# Patient Record
Sex: Male | Born: 1935 | Race: Asian | Hispanic: No | Marital: Married | Smoking: Former smoker
Health system: Southern US, Community
[De-identification: ages and names within clinical notes are randomized; demographics above are authoritative.]

## PROBLEM LIST (undated history)

## (undated) DIAGNOSIS — Z860100 Personal history of colon polyps, unspecified: Secondary | ICD-10-CM

## (undated) DIAGNOSIS — R51 Headache: Secondary | ICD-10-CM

## (undated) DIAGNOSIS — I350 Nonrheumatic aortic (valve) stenosis: Secondary | ICD-10-CM

## (undated) DIAGNOSIS — M1990 Unspecified osteoarthritis, unspecified site: Secondary | ICD-10-CM

## (undated) DIAGNOSIS — R519 Headache, unspecified: Secondary | ICD-10-CM

## (undated) DIAGNOSIS — K648 Other hemorrhoids: Secondary | ICD-10-CM

## (undated) DIAGNOSIS — I2510 Atherosclerotic heart disease of native coronary artery without angina pectoris: Secondary | ICD-10-CM

## (undated) DIAGNOSIS — J189 Pneumonia, unspecified organism: Secondary | ICD-10-CM

## (undated) DIAGNOSIS — N400 Enlarged prostate without lower urinary tract symptoms: Secondary | ICD-10-CM

## (undated) DIAGNOSIS — C4491 Basal cell carcinoma of skin, unspecified: Secondary | ICD-10-CM

## (undated) DIAGNOSIS — I714 Abdominal aortic aneurysm, without rupture, unspecified: Secondary | ICD-10-CM

## (undated) DIAGNOSIS — Z950 Presence of cardiac pacemaker: Secondary | ICD-10-CM

## (undated) DIAGNOSIS — Z8611 Personal history of tuberculosis: Secondary | ICD-10-CM

## (undated) DIAGNOSIS — R011 Cardiac murmur, unspecified: Secondary | ICD-10-CM

## (undated) DIAGNOSIS — K279 Peptic ulcer, site unspecified, unspecified as acute or chronic, without hemorrhage or perforation: Secondary | ICD-10-CM

## (undated) DIAGNOSIS — T280XXA Burn of mouth and pharynx, initial encounter: Secondary | ICD-10-CM

## (undated) DIAGNOSIS — I4892 Unspecified atrial flutter: Secondary | ICD-10-CM

## (undated) DIAGNOSIS — N419 Inflammatory disease of prostate, unspecified: Secondary | ICD-10-CM

## (undated) DIAGNOSIS — L820 Inflamed seborrheic keratosis: Secondary | ICD-10-CM

## (undated) DIAGNOSIS — R55 Syncope and collapse: Secondary | ICD-10-CM

## (undated) DIAGNOSIS — R410 Disorientation, unspecified: Secondary | ICD-10-CM

## (undated) DIAGNOSIS — I4891 Unspecified atrial fibrillation: Secondary | ICD-10-CM

## (undated) DIAGNOSIS — IMO0001 Reserved for inherently not codable concepts without codable children: Secondary | ICD-10-CM

## (undated) DIAGNOSIS — J309 Allergic rhinitis, unspecified: Secondary | ICD-10-CM

## (undated) DIAGNOSIS — I10 Essential (primary) hypertension: Secondary | ICD-10-CM

## (undated) DIAGNOSIS — H8110 Benign paroxysmal vertigo, unspecified ear: Secondary | ICD-10-CM

## (undated) DIAGNOSIS — K219 Gastro-esophageal reflux disease without esophagitis: Secondary | ICD-10-CM

## (undated) DIAGNOSIS — H9113 Presbycusis, bilateral: Secondary | ICD-10-CM

## (undated) DIAGNOSIS — I219 Acute myocardial infarction, unspecified: Secondary | ICD-10-CM

## (undated) DIAGNOSIS — K5730 Diverticulosis of large intestine without perforation or abscess without bleeding: Secondary | ICD-10-CM

## (undated) DIAGNOSIS — E782 Mixed hyperlipidemia: Secondary | ICD-10-CM

## (undated) DIAGNOSIS — I495 Sick sinus syndrome: Secondary | ICD-10-CM

## (undated) HISTORY — DX: Disorientation, unspecified: R41.0

## (undated) HISTORY — DX: Inflamed seborrheic keratosis: L82.0

## (undated) HISTORY — DX: Benign prostatic hyperplasia without lower urinary tract symptoms: N40.0

## (undated) HISTORY — DX: Cardiac murmur, unspecified: R01.1

## (undated) HISTORY — DX: Gastro-esophageal reflux disease without esophagitis: K21.9

## (undated) HISTORY — DX: Atherosclerotic heart disease of native coronary artery without angina pectoris: I25.10

## (undated) HISTORY — DX: Unspecified atrial flutter: I48.92

## (undated) HISTORY — DX: Other hemorrhoids: K64.8

## (undated) HISTORY — DX: Inflammatory disease of prostate, unspecified: N41.9

## (undated) HISTORY — DX: Unspecified osteoarthritis, unspecified site: M19.90

## (undated) HISTORY — DX: Unspecified atrial fibrillation: I48.91

## (undated) HISTORY — PX: OTHER SURGICAL HISTORY: SHX169

## (undated) HISTORY — DX: Peptic ulcer, site unspecified, unspecified as acute or chronic, without hemorrhage or perforation: K27.9

## (undated) HISTORY — PX: COLONOSCOPY: SHX174

## (undated) HISTORY — PX: EYE SURGERY: SHX253

## (undated) HISTORY — DX: Presbycusis, bilateral: H91.13

## (undated) HISTORY — DX: Allergic rhinitis, unspecified: J30.9

## (undated) HISTORY — DX: Abdominal aortic aneurysm, without rupture, unspecified: I71.40

## (undated) HISTORY — DX: Mixed hyperlipidemia: E78.2

## (undated) HISTORY — DX: Personal history of colonic polyps: Z86.010

## (undated) HISTORY — DX: Reserved for inherently not codable concepts without codable children: IMO0001

## (undated) HISTORY — PX: EXCISION OF TONGUE LESION: SHX6434

## (undated) HISTORY — DX: Syncope and collapse: R55

## (undated) HISTORY — DX: Essential (primary) hypertension: I10

## (undated) HISTORY — DX: Abdominal aortic aneurysm, without rupture: I71.4

## (undated) HISTORY — DX: Benign paroxysmal vertigo, unspecified ear: H81.10

## (undated) HISTORY — DX: Personal history of colon polyps, unspecified: Z86.0100

## (undated) HISTORY — DX: Personal history of tuberculosis: Z86.11

## (undated) HISTORY — DX: Diverticulosis of large intestine without perforation or abscess without bleeding: K57.30

## (undated) HISTORY — DX: Presence of cardiac pacemaker: Z95.0

## (undated) HISTORY — DX: Basal cell carcinoma of skin, unspecified: C44.91

## (undated) HISTORY — DX: Burn of mouth and pharynx, initial encounter: T28.0XXA

## (undated) HISTORY — DX: Sick sinus syndrome: I49.5

---

## 1995-09-14 HISTORY — PX: CORONARY ARTERY BYPASS GRAFT: SHX141

## 1998-09-13 HISTORY — PX: OTHER SURGICAL HISTORY: SHX169

## 1999-06-16 ENCOUNTER — Encounter: Admission: RE | Admit: 1999-06-16 | Discharge: 1999-07-21 | Payer: Self-pay | Admitting: Sports Medicine

## 2000-04-18 ENCOUNTER — Encounter: Admission: RE | Admit: 2000-04-18 | Discharge: 2000-07-17 | Payer: Self-pay | Admitting: Orthopedic Surgery

## 2000-09-13 HISTORY — PX: CARDIAC CATHETERIZATION: SHX172

## 2000-12-02 ENCOUNTER — Encounter: Payer: Self-pay | Admitting: Emergency Medicine

## 2000-12-02 ENCOUNTER — Emergency Department (HOSPITAL_COMMUNITY): Admission: EM | Admit: 2000-12-02 | Discharge: 2000-12-02 | Payer: Self-pay | Admitting: Emergency Medicine

## 2000-12-28 ENCOUNTER — Ambulatory Visit (HOSPITAL_COMMUNITY): Admission: RE | Admit: 2000-12-28 | Discharge: 2000-12-29 | Payer: Self-pay | Admitting: Interventional Cardiology

## 2002-05-15 ENCOUNTER — Encounter (INDEPENDENT_AMBULATORY_CARE_PROVIDER_SITE_OTHER): Payer: Self-pay | Admitting: Specialist

## 2002-05-15 ENCOUNTER — Ambulatory Visit (HOSPITAL_COMMUNITY): Admission: RE | Admit: 2002-05-15 | Discharge: 2002-05-15 | Payer: Self-pay | Admitting: Gastroenterology

## 2006-07-14 ENCOUNTER — Encounter: Admission: RE | Admit: 2006-07-14 | Discharge: 2006-07-14 | Payer: Self-pay | Admitting: Orthopedic Surgery

## 2006-08-03 ENCOUNTER — Ambulatory Visit (HOSPITAL_COMMUNITY): Admission: RE | Admit: 2006-08-03 | Discharge: 2006-08-04 | Payer: Self-pay | Admitting: Orthopedic Surgery

## 2006-08-10 ENCOUNTER — Encounter: Admission: RE | Admit: 2006-08-10 | Discharge: 2006-11-08 | Payer: Self-pay | Admitting: Orthopedic Surgery

## 2006-11-09 ENCOUNTER — Encounter: Admission: RE | Admit: 2006-11-09 | Discharge: 2006-11-23 | Payer: Self-pay | Admitting: Orthopedic Surgery

## 2006-11-24 ENCOUNTER — Encounter: Admission: RE | Admit: 2006-11-24 | Discharge: 2007-02-22 | Payer: Self-pay | Admitting: Orthopedic Surgery

## 2007-07-03 ENCOUNTER — Encounter: Admission: RE | Admit: 2007-07-03 | Discharge: 2007-07-03 | Payer: Self-pay | Admitting: Gastroenterology

## 2008-04-09 ENCOUNTER — Encounter: Admission: RE | Admit: 2008-04-09 | Discharge: 2008-04-09 | Payer: Self-pay | Admitting: Interventional Cardiology

## 2008-04-12 ENCOUNTER — Inpatient Hospital Stay (HOSPITAL_BASED_OUTPATIENT_CLINIC_OR_DEPARTMENT_OTHER): Admission: RE | Admit: 2008-04-12 | Discharge: 2008-04-12 | Payer: Self-pay | Admitting: Interventional Cardiology

## 2008-04-16 ENCOUNTER — Ambulatory Visit: Payer: Self-pay | Admitting: Internal Medicine

## 2008-04-16 DIAGNOSIS — J8410 Pulmonary fibrosis, unspecified: Secondary | ICD-10-CM

## 2008-04-16 DIAGNOSIS — E785 Hyperlipidemia, unspecified: Secondary | ICD-10-CM

## 2008-04-16 DIAGNOSIS — I219 Acute myocardial infarction, unspecified: Secondary | ICD-10-CM

## 2008-04-16 LAB — CONVERTED CEMR LAB
BUN: 13 mg/dL (ref 6–23)
Calcium: 9.2 mg/dL (ref 8.4–10.5)
Eosinophils Absolute: 0.3 10*3/uL (ref 0.0–0.7)
GFR calc Af Amer: 121 mL/min
Glucose, Bld: 82 mg/dL (ref 70–99)
HCT: 43.7 % (ref 39.0–52.0)
Hemoglobin: 14.9 g/dL (ref 13.0–17.0)
MCV: 94.4 fL (ref 78.0–100.0)
Monocytes Absolute: 0.5 10*3/uL (ref 0.1–1.0)
Monocytes Relative: 8.8 % (ref 3.0–12.0)
Neutro Abs: 3.2 10*3/uL (ref 1.4–7.7)
RDW: 12.1 % (ref 11.5–14.6)
Sodium: 139 meq/L (ref 135–145)

## 2009-08-13 HISTORY — PX: PUNCH BIOPSY OF SKIN: SHX6390

## 2010-06-16 ENCOUNTER — Inpatient Hospital Stay (HOSPITAL_COMMUNITY)
Admission: EM | Admit: 2010-06-16 | Discharge: 2010-06-18 | Payer: Self-pay | Source: Home / Self Care | Admitting: Emergency Medicine

## 2010-06-16 ENCOUNTER — Emergency Department (HOSPITAL_COMMUNITY): Admission: EM | Admit: 2010-06-16 | Discharge: 2010-06-16 | Payer: Self-pay | Admitting: Family Medicine

## 2010-07-31 HISTORY — PX: HEMORRHOID SURGERY: SHX153

## 2010-11-25 LAB — HEPATIC FUNCTION PANEL
ALT: 17 U/L (ref 0–53)
Albumin: 3.6 g/dL (ref 3.5–5.2)
Alkaline Phosphatase: 57 U/L (ref 39–117)
Indirect Bilirubin: 0.9 mg/dL (ref 0.3–0.9)
Total Bilirubin: 1.2 mg/dL (ref 0.3–1.2)

## 2010-11-25 LAB — URINALYSIS, ROUTINE W REFLEX MICROSCOPIC
Glucose, UA: NEGATIVE mg/dL
Hgb urine dipstick: NEGATIVE
Ketones, ur: 15 mg/dL — AB
Protein, ur: NEGATIVE mg/dL

## 2010-11-25 LAB — CULTURE, BLOOD (ROUTINE X 2): Culture  Setup Time: 201110050346

## 2010-11-25 LAB — CBC
HCT: 37 % — ABNORMAL LOW (ref 39.0–52.0)
MCH: 30.2 pg (ref 26.0–34.0)
MCH: 30.9 pg (ref 26.0–34.0)
MCHC: 34.6 g/dL (ref 30.0–36.0)
MCHC: 34.9 g/dL (ref 30.0–36.0)
Platelets: 224 10*3/uL (ref 150–400)
RDW: 13.3 % (ref 11.5–15.5)

## 2010-11-25 LAB — BASIC METABOLIC PANEL
CO2: 26 mEq/L (ref 19–32)
Calcium: 8.9 mg/dL (ref 8.4–10.5)
Creatinine, Ser: 0.83 mg/dL (ref 0.4–1.5)
Glucose, Bld: 124 mg/dL — ABNORMAL HIGH (ref 70–99)

## 2010-11-25 LAB — LIPASE, BLOOD: Lipase: 23 U/L (ref 11–59)

## 2010-11-25 LAB — DIFFERENTIAL
Eosinophils Absolute: 0 10*3/uL (ref 0.0–0.7)
Eosinophils Relative: 0 % (ref 0–5)
Lymphs Abs: 0.7 10*3/uL (ref 0.7–4.0)
Monocytes Relative: 6 % (ref 3–12)

## 2010-11-25 LAB — URINE CULTURE: Colony Count: >=100000

## 2011-01-26 NOTE — Discharge Summary (Signed)
NAMEJAMORION, Shaun James NO.:  [id number]   MEDICAL RECORD NO.:  [id number]          PATIENT TYPE:  OIB   LOCATION:  1961                         FACILITY:  [facility]   PHYSICIAN:  Lyn Records, M.D.   DATE OF BIRTH:  January 03, 1931   DATE OF ADMISSION:  04/12/2008  DATE OF DISCHARGE:                               DISCHARGE SUMMARY   INDICATION FOR THE PROCEDURE:  The patient recently had progression of  anginal symptoms.  A Cardiolite study did not demonstrate any focal  areas of ischemia.  However, the patient states that he cannot play golf  and do the things that he wants to do without frequent angina.  He has  also have fair amount of discomfort at rest, responsive to  nitroglycerin.  This study is being done to document current graft  anatomy and help to hopefully clarify whether or not his angina could be  related to multi zone ischemia presenting as a relatively normal  perfusion pattern on Cardiolite.   PROCEDURE PERFORMED:  1. Left heart catheterization.  2. Selective coronary angiography.  3. Left ventriculography.  4. Saphenous vein graft angiography.  5. Internal mammary graft angiography.  6. Abdominal aortography.   After we demonstrated a tortuous course of the guide wire and the  abdominal aorta.   DESCRIPTION:  A 4-French sheath was placed in the right femoral artery,  after using the modified Seldinger technique.  A 4-French A2  multipurpose catheter was then used for hemodynamic recordings, left  ventriculography by hand injection, selective bypass graft angiography.  We had difficulty getting the multipurpose catheter beyond the  descending aorta because the guidewire would have a tendency taken to  allege in the abdominal aorta.  We ultimately, removed the J-wire and  used a Wholey wire.  This wire was torquable and easy to maneuver into  the thoracic aorta.  We then replaced the [facility] wire with the J-wire  and cross the arch.  We used  preformed #4-French left Judkins catheter  for left coronary angiography and after failing to cannulate the left  subclavian with an internal mammary artery catheter we used a JR-4  catheter and needed to use the Wholey wire to negotiate the left  subclavian, which is very tortuous.  We were able to accomplish this and  performed angiography with #4 Judkins right 4-French catheter.  Abdominal aortography was then performed using a angle 4-French pigtail  catheter at 16 mL per second for a total of 35 mL.   Hemostasis was achieved after manual compression.  Results from  1. Hemodynamic data:      a.     Aortic pressure was 32/76.      b.     Left ventricular pressure 135 over 10 mmHg.  2. Left ventriculography:  The inferior wall was mildly hypokinetic.      The ejection fraction is 50%.  No MR noted.  3. Coronary angiography.      a.     Left main coronary:  Widely patent.      b.  Left anterior descending coronary:  The left anterior       descending coronary artery is totally occluded in the proximal       segment.      c.     Circumflex artery:  The dilated obtuse marginal branch was       totally occluded.  There is diffuse disease in the body of the       circumflex and the distal vessel was totally occluded.      d.     Right coronary:  The right coronary artery is totally       occluded in the mid vessel with recanalized formal collaterals.       The distal vessel was small and competes with bypass graft low       into the distal right.  4. LIMA to the LAD:  Widely patent.  5. Abdominal aortography:  The abdominal aortogram demonstrates a      small distal abdominal aneurysm.  There is a 50% left iliac      stenosis.  Renal arteries are patent.   CONCLUSIONS:  1. Patent saphenous vein grafts and internal mammary artery graft.  2. Severe native vessel coronary disease with total occlusion of LAD,      obtuse marginal distal circumflex, and mid right coronary.  3. Small  distal abdominal aortic aneurysm with 50% left iliac stenosis      noted.  4. Low normal LV function with inferior wall motion abnormalities.   PLAN:  Intensified medical regimen.  No need for intervention.  Aortic  abdominal aneurysm needs to be followed sequentially.      Lyn Records, M.D.  Electronically Signed     Lyn Records, M.D.  Electronically Signed    HWS/MEDQ  D:  04/12/2008  T:  04/12/2008  Job:  [id number]   cc:   Tasia Catchings, M.D.  Fax: [phone]

## 2011-01-29 NOTE — H&P (Signed)
[place]  Patient:    Shaun James, Shaun James                          MRN: [id number] Adm. Date:  [id number] Disc. Date: [id number] Attending:  Osvaldo Human Dictator:   Anselm Lis, N.P. CC:         Genene Churn. Sherin Quarry, M.D.   History and Physical  PRIMARY CARE Tashara Suder:  Genene Churn. Sherin Quarry, M.D.  DATE OF BIRTH:  1931-08-14.  PROBLEMS:  (As dictated by Dr. Verdis Prime) 1. Chest pain; rule out unstable angina pectoris, question related to coronary    artery spasm.  Previous medical history includes coronary artery bypass    graft x 16 August 1997 with left internal mammary artery to the left    anterior descending, saphenous vein graft to diagonal, saphenous vein graft    to obtuse marginal, saphenous vein graft to posterolateral branch of right    coronary artery by Dr. Rexanne Mano.  July 2001 exercise treadmill test at    Dr. Lonn Georgia office negative for ischemia. 2. Hypercholesterolemia; on Lipitor, followed by Dr. Sherin Quarry. 3. Presbycusis. 4. History of gastroesophageal reflux disease/peptic ulcer disease without    recent symptoms. 5. Degenerative joint disease of hands/lower back. 6. Urinary frequency/nocturia followed by Dr. Aldean Ast in the past. 7. Remote tuberculosis. 8. Calcifications of the liver on CT scan. 9. History of diverticulosis.  PLAN:  (As dictated by Dr. Verdis Prime) Admit to telemetry, rule out myocardial infarction.  Protocol serial cardiac enzymes and daily EKG. Continue aspirin.  Otherwise, pending Dr. Lonn Georgia review.  HISTORY OF PRESENT ILLNESS:  A thin 77 year old [place] male with history significant for CABG 3-1/2 years ago, dyslipidemia, and positive family history of CAD.  His anginal equivalent is epigastric pressure.  He developed the same while golfing today in a cold environment.  He had associated anxiety but no radiation of discomfort nor nausea or shortness of breath.  He took one sublingual nitrate  without appreciable change.  He went to his daughters home, who lives nearby, and took another sublingual nitrate and rested with decrease of pain down to a 4-5/10.  He got up to drive himself home but noted a reoccurrence in intensity of discomfort.  He summoned EMS, who gave him four baby aspirin.  He arrived at [facility] where he became pain free after administration of O2.  His EKG was non-ischemic and his first set of heart enzymes are negative.  The patients anginal equivalent in the past is epigastric discomfort with sometimes associated left posterior shoulder discomfort.  His last episode of this type of discomfort was during a stressful incident about two months earlier while in [place].  Otherwise, the patient has a quite vigorous exercise regimen, walking 3-1/2 miles a day, walking the treadmill, and using a ______ bicycle without precipitation of this discomfort.  He notes this discomfort is usually associated with cold and/or anxiety state.  He had a followup treadmill test in Dr. Lonn Georgia office July 2001 which was negative for ischemia.  ALLERGIES:  No known drug allergies.  MEDICATIONS: 1. Lipitor 10 mg p.o. q.d. 2. Enteric-coated aspirin 325 mg a day. 3. Cardizem CD 240 mg a day. 4. Nitroglycerin tablet 0.4 sublingual p.r.n. chest pain. 5. Lorazepam 0.5 mg p.r.n. anxiety, seldom taken.  SOCIAL HISTORY:  Tobacco:  Quit in 1962.  ETOH:  Negative.  Caffeine: Negative.  The patient is a  retired Retail banker.  He has been married since 1962.  He has three children alive and well.  FAMILY HISTORY:  Father died at age 75 in [place] War.  Mother died at age 53 of a heart attack.  Two brothers.  One died of COPD and one alive with hypertension and heart disease.  One sister alive and well.  REVIEW OF SYSTEMS:  Denies dizziness, lightheadedness, syncope, nor near syncopal episodes.  Denies dysphagia to food or fluid.  No GERD symptoms.  He does wear glasses.   He is hard of hearing.  Denies melena, episodic bright red blood PR from hemorrhoidal bleed.  He has had past hemorrhoidectomy.  He does have nocturia 3-4 times a week.  He was on Flomax in the past by Dr. Aldean Ast but self-discontinued this because he felt it made him too dehydrated.  Denies pedal edema, orthopnea, DOE, nor PND.  He does have arthritic-type complaints affecting the bilateral hands and lower back.  PHYSICAL EXAMINATION:  (As performed by Dr. Verdis Prime)  VITAL SIGNS:  Blood pressure 156/94, subsequently 136/73.  Heart rate 60 and regular respiratory rate 18.  GENERAL:  He is a slender, pleasant [place] male in no acute distress.  His wife and daughter are in attendance.  He is currently pain free.  HEENT:  Brisk bilateral carotid upstroke without bruit.  NECK:  No JVD, no thyromegaly.  CARDIAC:  Regular rate and rhythm without murmurs, rubs, or gallops.  Normal S1 and S2.  CHEST:  Scant inspiratory basilar rales.  ABDOMEN:  Soft, nondistended, ______ bowel sounds.  Negative abdominal aorta. No renal or femoral bruit.  No masses, no organomegaly appreciated.  Nontender to applied pressure.  EXTREMITIES:  +2/4 bilateral radial, femoral, dorsalis pedis, posterior tibial pulses.  Negative pedal edema.  NEUROLOGIC:  Nonfocal.  Cranial nerves II-XII grossly intact.  Alert and oriented x 3.  GENITALIA/RECTUM:  Deferred.  LABORATORY DATA:  Sodium 134, potassium 4.1, chloride 104, CO2 27, BUN 13, creatinine 0.8, glucose 94, calcium 8.4.  LFTs within normal range. Hemoglobin 14.3 with RBC 6.4, hematocrit 42.5, platelets 204.  CK 108 with MB fraction 1.8, troponin-I of less than 0.01.  Chest x-ray revealed no active disease, asbestos-related pleural disease.  PT 12.9 with INR of 1.0 and PTT 27. DD:  12/02/00 TD:  12/04/00 Job: [id number]

## 2011-01-29 NOTE — Cardiovascular Report (Signed)
NAMEMOUSSA, WIEGAND NO.:  [id number]   MEDICAL RECORD NO.:  [id number]          PATIENT TYPE:  OIB   LOCATION:  1961                         FACILITY:  [facility]   PHYSICIAN:  Lyn Records, M.D.   DATE OF BIRTH:  1930-09-21   DATE OF PROCEDURE:  DATE OF DISCHARGE:  04/12/2008                            CARDIAC CATHETERIZATION   ADDENDUM   This addendum will be description of coronary bypass grafts.  A.  saphenous vein graft to the first diagonal:  This graft contains  segmental 30-50% narrowing starting at the ostium and extending into the  proximal one-third of the bypass graft.  The graft is patent otherwise  and supplies a moderate-sized although diffusely diseased first  diagonal.  B.  Saphenous vein graft to the circumflex/obtuse marginal:  This graft  is widely patent with distal 30-40% narrowing in the saphenous vein  graft before its termination on the obtuse marginal.  C.  Saphenous vein graft to the right coronary:  This graft is widely  patent with proximal luminal irregularities noted.  The right coronary  distal bed is small and diffusely diseased.   CONCLUSION:  Widely patent saphenous vein grafts.   PLAN:  As per the initial catheterization report performed on April 12, 2008.      Lyn Records, M.D.  Electronically Signed     Lyn Records, M.D.  Electronically Signed    HWS/MEDQ  D:  04/29/2008  T:  04/30/2008  Job:  [id number]   cc:   Tasia Catchings, M.D.

## 2011-01-29 NOTE — Cardiovascular Report (Signed)
[place]  Patient:    KAJ, Shaun James                          MRN: [id number] Proc. Date: 12/28/00 Adm. Date:  [id number] Disc. Date: [id number] Attending:  Lyn Records. Iii CC:         Genene Churn. Sherin Quarry, M.D.   Cardiac Catheterization  INDICATION FOR PROCEDURE:  Angina, particularly during cold weather.  The patient has a history of coronary disease and stents post coronary artery bypass surgery in 1997.  PROCEDURES PERFORMED: 1. Left heart catheterization. 2. Selective coronary angioplasty. 3. Left ventriculography. 4. Saphenous vein graft angiography. 5. Left internal mammary angiography. 6. Perclose arteriotomy closure.  CARDIOLOGIST:  Darci Needle, M.D.  DESCRIPTION:  After informed consent, a 6-French sheath was inserted into the right femoral artery using the modified Seldinger technique.  A 6-French A2 multipurpose catheter was used for hemodynamic recordings, left ventriculography, selective right coronary angiography, and saphenous vein graft angiography.   We then used a 6-French #4 left Judkins catheter for left coronary angiography.  A 6-French right bypass graft catheter was used right saphenous vein graft angiography, and an internal mammary artery catheter was used for left internal mammary artery angiography.  After the procedure, we visualized the right femoral/iliac region and performed Perclose.  At the completion of the procedure, after using but pushing to secure the knot, we noted that there was tethering of the skin that invaginated the skin down towards the artery.  There was obvious suture-based attachment of the subcutaneous tissue and the artery.  Hemostasis was good, however.  Because of this appearance, I felt that this needed evaluation by the vascular surgery service, and Dr. Denman George saw the patient.  In the holding area, he was able to slightly enlarge the puncture site, to grasp the suture,  and to remove the suture.  Manual pressure was then held, and no subsequent complications occurred.  RESULTS: I.   Hemodynamic data:      a. Aortic pressure 172/88 mmHg.      b. Left ventricular pressure 173/12 mmHg. II.  Left ventriculography:  The left ventricle cavity size and systolic      function are normal.  No significant regional wall motion abnormalities      were noted.  EF is 65%. III. Selective coronary angiography.      a. Left main coronary: No significant left main abnormalities are noted.      b. Left anterior descending coronary: The left anterior descending         coronary artery contains high-grade obstruction in the proximal         segment as it arises from the left main.  Two septal perforator         branches are noted distal to this.  LAD beyond these two septal         perforators is totally occluded.      c. Circumflex artery: The circumflex artery is diffusely diseased.  A         small third obtuse marginal branch arises distally.  There is 60 to         80% stenosis in the mid vessel.  The large second obtuse marginal         is totally occluded.      d. Right coronary:  The right coronary is totally occluded distally.  In the mid vessel, there is diffuse disease with up to 90% stenosis. IV.  Bypass graft angiography.      a. Saphenous vein graft to the obtuse marginal: This graft is widely         patent.  No significant abnormalities are noted.  The distal         circumflex/obtuse marginal is moderately diffusely diseased.      b. Saphenous vein graft to the diagonal: The saphenous vein graft         contains segmental 50% narrowing starting at the ostium and         entering into the proximal one-third of this bypass graft.  The         graft is widely patent.  No significant obstructive lesions are         felt to be present.      c. Saphenous vein graft to the right coronary: The saphenous vein graft         is widely patent.  An eccentric  plaque is noted in the proximal         third of this vessel.  It attaches distally into the PDA.  No         anastomotic problems are noted. IV.  Left internal mammary graft to the LAD:  This graft is widely patent.      The LAD distal to the graft insertion site is widely patent.  CONCLUSIONS: 1. Severe native vessel coronary disease with total occlusion of the mid left    anterior descending artery, high-grade obstruction in the proximal left    anterior descending artery before the first and second septal perforator,    and total occlusion thereafter.  Total occlusion of the second obtuse    marginal branch.  Total occlusion of the distal right coronary artery. 2. Patent saphenous vein grafts with only mild to moderate atherosclerosis    noted in the saphenous vein graft to the diagonal and obtuse marginal and    minimal plaquing in the saphenous vein graft to the right coronary. 3. Widely patent left internal mammary artery to the left anterior descending    artery. 4. Normal left ventricular function. 5. Angina probably related to native vessel coronary disease, perhaps in the    anterior wall/septal region that is dependent on flow through a long    segmental stenosis in the proximal left anterior descending artery before    the first two septal perforators.  This overall vessel diameter is    relatively small and probably not approachable with intervention.  It is    also potentially possible that the patients native circumflex that    supplies a small distal obtuse marginal may be the cause of the patients    angina.  PLAN:  Medical therapy.DD:  12/28/00 TD:  12/29/00 Job: [id number]

## 2011-01-29 NOTE — Op Note (Signed)
NAMEKRYSTIAN, Shaun James                   ACCOUNT NO.:  [id number]   MEDICAL RECORD NO.:  [id number]          PATIENT TYPE:  AMB   LOCATION:  DAY                          FACILITY:  [facility]   PHYSICIAN:  John L. Rendall, M.D.  DATE OF BIRTH:  1931-07-24   DATE OF PROCEDURE:  08/03/2006  DATE OF DISCHARGE:                               OPERATIVE REPORT   PREOPERATIVE DIAGNOSIS:  Massive chronic rotator cuff tear, left  shoulder.   SURGICAL PROCEDURES:  Diagnostic arthroscopy with acromioplasty and  repair of massive cuff avulsion including all three tendons.   POSTOPERATIVE DIAGNOSIS:  Massive chronic rotator cuff tear, left  shoulder.   SURGEON:  John L. Rendall, M.D.   ASSISTANT:  Legrand Pitts. Duffy, P.A.   ANESTHESIA:  General.   PROCEDURE:  Under general anesthesia, the patient is placed in a  shoulder frame in a semi-sitting position and it is prepared with  DuraPrep and draped as a sterile field.  The posterior entry is made  through the scope and after determining no significant glenohumeral  pathology or pathology of the biceps tendon or labrum, the cuff was  evaluated and found to be massively avulsed anteriorly, superiorly and  posteriorly.  Attention was then turned to the open procedure.  An  approximate 3-1/2 inch incision is made beginning just lateral to the [facility]  joint obliquely in line of the deltoid fibers across the acromion.  Dissection is carried approximately 3 cm below the tip of the acromion,  splitting fibers of the deltoid and allowing separation.  The deltoid is  taken down from the acromion with electrocautery, releasing it from the  periosteum, and a Gelpi retractor is inserted.  A bare humeral head is  encountered.  The cuff has migrated totally off the head posteriorly and  a portion of it anteriorly.  A superficial trough is then created along  the greater tuberosity, extending posteriorly to accept the  infraspinatus.  Three drill holes were placed in bone  using the Concept  punch set and the tendon ends are then freshened with the 10 blade and  the bulk of the cuff seen posteriorly is sutured through to drill holes  using a #2 FiberWire, pulling it anteriorly and laterally, repairing the  bulk of the infraspinatus and supraspinatus.  The anterior portion of  the supraspinatus is attached through one drill hole, sewn back on  itself, anchoring this area.  Additional sutures of #2 FiberWire ae used  anteriorly and posterior to close down small open areas along the tendon  edge.  This gave, amazingly, an almost watertight closure once it was  completed, and it was stable through range of motion.  It should be  noted that an acromioplasty was required but was relatively small using  osteotome mallet and a fine rasp.  At this point with the cuff repaired,  the deltoid was reattached through two drill holes in the acromion using  #2 Ethibond suture, grabbing  full-thickness deltoid and pulling it up onto the anterolateral acromion  and posterior lateral acromion.  Once these are in  place the deltoid  split is loosely reapproximated with 2-0 Vicryl, subcu with 2-0 Vicryl,  and skin with clips.  The patient returned to recovery in good condition  with his arm in a sling.      John L. Rendall, M.D.  Electronically Signed     JLR/MEDQ  D:  08/03/2006  T:  08/03/2006  Job:  [id number]

## 2011-08-25 ENCOUNTER — Telehealth: Payer: Self-pay | Admitting: Internal Medicine

## 2011-08-25 NOTE — Telephone Encounter (Signed)
I was contacted by [employer].  Patient just returned from [place] and has been nauseous and had diarrhea.  He felt dizzy this evening and EKG transmission showed 2 episodes of 3.7 second pause.  These are sinus brady events and there was NO AV block.  Currently, the patient is in sinus rhythm at a rate of 52 BPM and feels fine.  I suspect that his pause was likely a vagally mediated phenomenon from his GI complaints.  Since there was no AV-block, I do not think that he needs to come to the hospital.  They will instruct him to increase his fluid intake and continue to monitor his EKG.  If he has worsening sxs, then he will come to the ED.

## 2011-08-27 ENCOUNTER — Encounter: Payer: Self-pay | Admitting: Emergency Medicine

## 2011-08-27 ENCOUNTER — Emergency Department (HOSPITAL_COMMUNITY): Payer: Medicare Other

## 2011-08-27 ENCOUNTER — Inpatient Hospital Stay (HOSPITAL_COMMUNITY)
Admission: EM | Admit: 2011-08-27 | Discharge: 2011-08-28 | DRG: 244 | Disposition: A | Discharge status: Home / Self Care | Payer: Medicare Other | Attending: Internal Medicine | Admitting: Internal Medicine

## 2011-08-27 ENCOUNTER — Other Ambulatory Visit: Payer: Self-pay

## 2011-08-27 ENCOUNTER — Encounter (HOSPITAL_COMMUNITY)
Admission: EM | Disposition: A | Discharge status: Home / Self Care | Payer: Self-pay | Source: Home / Self Care | Attending: Internal Medicine

## 2011-08-27 DIAGNOSIS — R55 Syncope and collapse: Secondary | ICD-10-CM

## 2011-08-27 DIAGNOSIS — I498 Other specified cardiac arrhythmias: Principal | ICD-10-CM | POA: Diagnosis present

## 2011-08-27 DIAGNOSIS — I495 Sick sinus syndrome: Secondary | ICD-10-CM

## 2011-08-27 DIAGNOSIS — I10 Essential (primary) hypertension: Secondary | ICD-10-CM | POA: Diagnosis present

## 2011-08-27 DIAGNOSIS — I440 Atrioventricular block, first degree: Secondary | ICD-10-CM | POA: Diagnosis present

## 2011-08-27 DIAGNOSIS — I455 Other specified heart block: Secondary | ICD-10-CM

## 2011-08-27 DIAGNOSIS — Z951 Presence of aortocoronary bypass graft: Secondary | ICD-10-CM

## 2011-08-27 DIAGNOSIS — I2510 Atherosclerotic heart disease of native coronary artery without angina pectoris: Secondary | ICD-10-CM | POA: Diagnosis present

## 2011-08-27 DIAGNOSIS — E785 Hyperlipidemia, unspecified: Secondary | ICD-10-CM

## 2011-08-27 DIAGNOSIS — Z7982 Long term (current) use of aspirin: Secondary | ICD-10-CM

## 2011-08-27 DIAGNOSIS — N400 Enlarged prostate without lower urinary tract symptoms: Secondary | ICD-10-CM | POA: Diagnosis present

## 2011-08-27 HISTORY — DX: Acute myocardial infarction, unspecified: I21.9

## 2011-08-27 HISTORY — PX: PERMANENT PACEMAKER INSERTION: SHX5480

## 2011-08-27 HISTORY — PX: INSERT / REPLACE / REMOVE PACEMAKER: SUR710

## 2011-08-27 LAB — CARDIAC PANEL(CRET KIN+CKTOT+MB+TROPI)
CK, MB: 2.3 ng/mL (ref 0.3–4.0)
CK, MB: 2.3 ng/mL (ref 0.3–4.0)
Relative Index: INVALID (ref 0.0–2.5)
Relative Index: INVALID (ref 0.0–2.5)
Total CK: 70 U/L (ref 7–232)
Troponin I: <0.3 ng/mL (ref <0.30)
Troponin I: <0.3 ng/mL (ref <0.30)

## 2011-08-27 LAB — COMPREHENSIVE METABOLIC PANEL
Albumin: 3.8 g/dL (ref 3.5–5.2)
Alkaline Phosphatase: 63 U/L (ref 39–117)
BUN: 12 mg/dL (ref 6–23)
Calcium: 9.1 mg/dL (ref 8.4–10.5)
Creatinine, Ser: 1 mg/dL (ref 0.50–1.35)
GFR calc Af Amer: 81 mL/min — ABNORMAL LOW (ref >90)
Glucose, Bld: 116 mg/dL — ABNORMAL HIGH (ref 70–99)
Potassium: 4.1 mEq/L (ref 3.5–5.1)
Total Protein: 7 g/dL (ref 6.0–8.3)

## 2011-08-27 LAB — CBC
HCT: 41.4 % (ref 39.0–52.0)
Hemoglobin: 14.2 g/dL (ref 13.0–17.0)
Hemoglobin: 13.6 g/dL (ref 13.0–17.0)
MCHC: 33.9 g/dL (ref 30.0–36.0)
MCV: 90.4 fL (ref 78.0–100.0)
Platelets: 200 10*3/uL (ref 150–400)
RBC: 4.58 MIL/uL (ref 4.22–5.81)
RDW: 13.8 % (ref 11.5–15.5)
WBC: 6.4 10*3/uL (ref 4.0–10.5)

## 2011-08-27 LAB — PROTIME-INR
INR: 1.19 (ref 0.00–1.49)
Prothrombin Time: 15.4 seconds — ABNORMAL HIGH (ref 11.6–15.2)

## 2011-08-27 LAB — CREATININE, SERUM
Creatinine, Ser: 0.87 mg/dL (ref 0.50–1.35)
GFR calc non Af Amer: 81 mL/min — ABNORMAL LOW (ref >90)

## 2011-08-27 SURGERY — PERMANENT PACEMAKER INSERTION
Anesthesia: LOCAL

## 2011-08-27 MED ORDER — ACETAMINOPHEN 325 MG PO TABS: 650.0000 mg | ORAL_TABLET | ORAL | Status: DC | PRN

## 2011-08-27 MED ORDER — VITAMIN E 45 MG (100 UNIT) PO CAPS
100.0000 [IU] | ORAL_CAPSULE | Freq: Every day | ORAL | Status: DC
  Administered 2011-08-28: 100 [IU] via ORAL
  Filled 2011-08-27 (×2): qty 1

## 2011-08-27 MED ORDER — NITROGLYCERIN 0.4 MG SL SUBL: 0.4000 mg | SUBLINGUAL_TABLET | SUBLINGUAL | Status: DC | PRN

## 2011-08-27 MED ORDER — TAMSULOSIN HCL 0.4 MG PO CAPS
0.4000 mg | ORAL_CAPSULE | Freq: Every day | ORAL | Status: DC
  Administered 2011-08-27 – 2011-08-28 (×2): 0.4 mg via ORAL
  Filled 2011-08-27 (×2): qty 1

## 2011-08-27 MED ORDER — ISOSORBIDE MONONITRATE ER 60 MG PO TB24: 60.0000 mg | ORAL_TABLET | Freq: Every day | ORAL | Status: DC

## 2011-08-27 MED ORDER — GLUCOSAMINE HCL 1000 MG PO TABS: 1000.0000 mg | ORAL_TABLET | Freq: Every day | ORAL | Status: DC

## 2011-08-27 MED ORDER — SIMVASTATIN 20 MG PO TABS
20.0000 mg | ORAL_TABLET | Freq: Every day | ORAL | Status: DC
  Filled 2011-08-27: qty 1

## 2011-08-27 MED ORDER — OMEGA-3 FATTY ACIDS 1000 MG PO CAPS
1.0000 g | ORAL_CAPSULE | Freq: Every day | ORAL | Status: DC
  Administered 2011-08-27 – 2011-08-28 (×2): 1 g via ORAL
  Filled 2011-08-27 (×2): qty 1

## 2011-08-27 MED ORDER — RANOLAZINE ER 500 MG PO TB12
500.0000 mg | ORAL_TABLET | Freq: Two times a day (BID) | ORAL | Status: DC
  Administered 2011-08-27 – 2011-08-28 (×2): 500 mg via ORAL
  Filled 2011-08-27 (×3): qty 1

## 2011-08-27 MED ORDER — HEPARIN (PORCINE) IN NACL 2-0.9 UNIT/ML-% IJ SOLN
INTRAMUSCULAR | Status: AC
  Filled 2011-08-27: qty 1000

## 2011-08-27 MED ORDER — OLMESARTAN MEDOXOMIL 20 MG PO TABS
20.0000 mg | ORAL_TABLET | Freq: Every day | ORAL | Status: DC
  Administered 2011-08-27 – 2011-08-28 (×2): 20 mg via ORAL
  Filled 2011-08-27 (×2): qty 1

## 2011-08-27 MED ORDER — CELECOXIB 200 MG PO CAPS
200.0000 mg | ORAL_CAPSULE | Freq: Every day | ORAL | Status: DC | PRN
  Filled 2011-08-27: qty 1

## 2011-08-27 MED ORDER — LIDOCAINE HCL (PF) 1 % IJ SOLN
INTRAMUSCULAR | Status: AC
  Filled 2011-08-27: qty 60

## 2011-08-27 MED ORDER — SODIUM CHLORIDE 0.9 % IV SOLN: 250.0000 mL | INTRAVENOUS | Status: DC | PRN

## 2011-08-27 MED ORDER — CHLORHEXIDINE GLUCONATE 4 % EX LIQD: 60.0000 mL | Freq: Once | CUTANEOUS | Status: DC

## 2011-08-27 MED ORDER — SODIUM CHLORIDE 0.45 % IV SOLN: INTRAVENOUS | Status: DC

## 2011-08-27 MED ORDER — DILTIAZEM HCL ER COATED BEADS 240 MG PO CP24
240.0000 mg | ORAL_CAPSULE | Freq: Every day | ORAL | Status: DC
  Filled 2011-08-27: qty 1

## 2011-08-27 MED ORDER — ACETAMINOPHEN-CODEINE #3 300-30 MG PO TABS: 1.0000 | ORAL_TABLET | ORAL | Status: DC | PRN

## 2011-08-27 MED ORDER — SODIUM CHLORIDE 0.9 % IJ SOLN: 3.0000 mL | Freq: Two times a day (BID) | INTRAMUSCULAR | Status: DC

## 2011-08-27 MED ORDER — SIMVASTATIN 20 MG PO TABS
20.0000 mg | ORAL_TABLET | Freq: Every day | ORAL | Status: DC
  Administered 2011-08-27: 20 mg via ORAL
  Filled 2011-08-27 (×2): qty 1

## 2011-08-27 MED ORDER — CHLORHEXIDINE GLUCONATE 4 % EX LIQD
CUTANEOUS | Status: AC
  Filled 2011-08-27: qty 15

## 2011-08-27 MED ORDER — SODIUM CHLORIDE 0.9 % IV SOLN: INTRAVENOUS | Status: DC

## 2011-08-27 MED ORDER — ASPIRIN EC 81 MG PO TBEC
81.0000 mg | DELAYED_RELEASE_TABLET | Freq: Every day | ORAL | Status: DC
  Filled 2011-08-27: qty 1

## 2011-08-27 MED ORDER — MIDAZOLAM HCL 5 MG/5ML IJ SOLN
INTRAMUSCULAR | Status: AC
  Filled 2011-08-27: qty 5

## 2011-08-27 MED ORDER — ONDANSETRON HCL 4 MG/2ML IJ SOLN: 4.0000 mg | Freq: Four times a day (QID) | INTRAMUSCULAR | Status: DC | PRN

## 2011-08-27 MED ORDER — FENTANYL CITRATE 0.05 MG/ML IJ SOLN
INTRAMUSCULAR | Status: AC
  Filled 2011-08-27: qty 2

## 2011-08-27 MED ORDER — SODIUM CHLORIDE 0.9 % IR SOLN
80.0000 mg | Status: AC
  Administered 2011-08-27: 80 mg
  Filled 2011-08-27: qty 2

## 2011-08-27 MED ORDER — ASPIRIN 325 MG PO TABS
325.0000 mg | ORAL_TABLET | Freq: Every day | ORAL | Status: DC
  Administered 2011-08-27 – 2011-08-28 (×2): 325 mg via ORAL
  Filled 2011-08-27 (×2): qty 1

## 2011-08-27 MED ORDER — VANCOMYCIN HCL IN DEXTROSE 1-5 GM/200ML-% IV SOLN
1000.0000 mg | INTRAVENOUS | Status: AC
  Administered 2011-08-27: 1000 mg via INTRAVENOUS
  Filled 2011-08-27: qty 200

## 2011-08-27 MED ORDER — HEPARIN SODIUM (PORCINE) 5000 UNIT/ML IJ SOLN
INTRAMUSCULAR | Status: AC
  Filled 2011-08-27: qty 1

## 2011-08-27 MED ORDER — SODIUM CHLORIDE 0.9 % IV SOLN: 250.0000 mL | INTRAVENOUS | Status: AC

## 2011-08-27 MED ORDER — SODIUM CHLORIDE 0.9 % IJ SOLN: 3.0000 mL | INTRAMUSCULAR | Status: DC | PRN

## 2011-08-27 MED ORDER — HEPARIN SODIUM (PORCINE) 5000 UNIT/ML IJ SOLN
5000.0000 [IU] | Freq: Three times a day (TID) | INTRAMUSCULAR | Status: DC
  Administered 2011-08-27: 5000 [IU] via SUBCUTANEOUS
  Filled 2011-08-27 (×3): qty 1

## 2011-08-27 NOTE — ED Notes (Signed)
PT.'S SPOUSE  REPORTS DIZZINESS / LIGHTHEADED FOR SEVERAL DAYS , RECEIVED A CALL FROM HEART MONITOR TECHNICIAN THIS MORNING ADVISED TO GO TO ER DUE TO IRREGULAR HEART RHYTHM. PT. DENIES CHEST PAIN OR PALPITATIONS , NO SOB .

## 2011-08-27 NOTE — ED Provider Notes (Signed)
History     CSN: [id number] Arrival date & time: 08/27/2011  3:05 AM   First MD Initiated Contact with Patient 08/27/11 [phone]      Chief Complaint  Patient presents with  . Dizziness    (Consider location/radiation/quality/duration/timing/severity/associated sxs/prior treatment) Patient is a 77 y.o. male presenting with syncope. The history is provided by the patient and the spouse. No language interpreter was used.  Loss of Consciousness This is a recurrent problem. The current episode started more than 1 week ago. The problem occurs every several days. The problem has been gradually worsening. Associated symptoms include headaches. Pertinent negatives include no chest pain, no abdominal pain and no shortness of breath. Associated symptoms comments: nausea. The symptoms are aggravated by nothing. The symptoms are relieved by nothing. The treatment provided no relief.    Past Medical History  Diagnosis Date  . Hypertension   . Coronary artery disease     Past Surgical History  Procedure Date  . Coronary artery bypass graft     No family history on file.  History  Substance Use Topics  . Smoking status: Never Smoker   . Smokeless tobacco: Not on file  . Alcohol Use: No      Review of Systems  Constitutional: Negative for activity change.  HENT: Negative for facial swelling.   Eyes: Negative for discharge.  Respiratory: Negative for shortness of breath and wheezing.   Cardiovascular: Positive for syncope. Negative for chest pain.  Gastrointestinal: Positive for nausea. Negative for abdominal pain and abdominal distention.  Genitourinary: Negative for difficulty urinating.  Musculoskeletal: Negative for arthralgias.  Neurological: Positive for syncope and headaches.  Hematological: Negative for adenopathy.  Psychiatric/Behavioral: Negative for agitation.    Allergies  Zyrtec and Penicillins  Home Medications   Current Outpatient Rx  Name Route Sig Dispense  Refill  . DILTIAZEM HCL ER COATED BEADS 240 MG PO CP24 Oral Take 240 mg by mouth daily.      Carma Leaven M PLUS PO TABS Oral Take 1 tablet by mouth daily.      Marland Kitchen PRESCRIPTION MEDICATION  Blood pressure medication     . SIMVASTATIN 10 MG PO TABS Oral Take 10 mg by mouth at bedtime.        BP 121/64  Pulse 50  Temp(Src) 97.5 F (36.4 C) (Oral)  Resp 14  SpO2 98%  Physical Exam  Constitutional: He is oriented to person, place, and time. He appears well-developed and well-nourished.  HENT:  Head: Normocephalic and atraumatic.  Mouth/Throat: Oropharynx is clear and moist.  Eyes: EOM are normal. Pupils are equal, round, and reactive to light.  Neck: Normal range of motion. Neck supple.  Cardiovascular: Regular rhythm.  Bradycardia present.   Pulmonary/Chest: Effort normal and breath sounds normal. He has no wheezes.  Abdominal: Soft. Bowel sounds are normal. There is no tenderness. There is no rebound and no guarding.  Musculoskeletal: Normal range of motion. He exhibits no edema.  Neurological: He is alert and oriented to person, place, and time.  Skin: Skin is warm. He is not diaphoretic.  Psychiatric: Thought content normal.    ED Course  Procedures (including critical care time)  Labs Reviewed - No data to display No results found.   1. Sinus pause   2. Near syncope       MDM   Date: 08/27/2011  Rate: 47  Rhythm: sinus bradycardia  QRS Axis: normal  Intervals: PR prolonged  ST/T Wave abnormalities: nonspecific ST changes  Conduction  Disutrbances:first-degree A-V block   Narrative Interpretation:   Old EKG Reviewed: changes noted    Called monitor company, 3 causes 4.5 6 and 6.4 sec     Claudett Bayly K Elfrieda Espino-Rasch, MD 08/27/11 [phone]

## 2011-08-27 NOTE — ED Notes (Signed)
Patient is resting comfortably. 

## 2011-08-27 NOTE — ED Notes (Signed)
MD at [facility] called regarding schedule of cardiac enzyme draw. MD ordered for them to be discontinued at this time.

## 2011-08-27 NOTE — H&P (Signed)
Physician History and Physical    Shaun James MRN: [id number] DOB/AGE: 1931-01-03 77 y.o. Admit date: 08/27/2011  Primary Care Physician: Dr. Kevan Ny Primary Cardiologist: Dr. Katrinka Blazing  HPI:  77 yo with history CAD s/p CABG and spells of lightheadedness was sent to the ER after long pauses were noted on cardiac event monitor.  Patient reports episodes of lightheadedness/presyncope for up to a year.  They have been getting worse recently.   He had to quit playing golf because he would get lightheaded spells on the golf course.  He has never actually passed out or fallen.  He has been wearing an event monitor for the last couple of days.  Over the last day, he has had 6-7 spells of lightheadedness.  The event monitor company called with 3 long pauses: 4.5 sec, 6 sec, 6.4 sec.  Strips have not yet been faxed over (have been called for).   He denies recent chest pain or pressure.  Last cath was in 2009 with patent grafts.  He has dyspnea with heavy exertion but is able to walk on the treadmill for exercise for up to 30 minutes without dyspnea.    Past Medical History:  1. CAD: s/p CABG in 1998.  Last cath in 7/09 with patent SVG-D (40% proximal stenosis), SVG-OM, SVG-distal RCA, and LIMA-LAD.  Severe native vessel disease.  EF 65%.  2. BPH 3. HTN 4. Hyperlipidemia 5. Left rotator cuff tear 6. H/o prostatitis  Medications: ASA 81 mg daily Diltiazem CD 240 mg daily Simvastatin 20 mg daily  Another BP med he does not remember  Review of systems complete and found to be negative unless listed above    History   Social History  . Marital Status: Married    Spouse Name: N/A    Number of Children: N/A  . Years of Education: N/A   Occupational History  . Not on file.   Social History Main Topics  . Smoking status: Quit smoking in 1967  . Smokeless tobacco: Not on file  . Alcohol Use: No  . Drug Use: No  . Sexually Active:    Other Topics Concern  . Retired   Social History Narrative    . No narrative on file    Family history: No premature CAD.   Physical Exam: Blood pressure 130/67, pulse 53, temperature 97.9 F (36.6 C), temperature source Oral, resp. rate 17, SpO2 98.00%.  General: NAD Neck: JVP 8-9 cm, no thyromegaly or thyroid nodule.  Lungs: Clear to auscultation bilaterally with normal respiratory effort. CV: Nondisplaced PMI.  Heart regular S1/S2, no S3/S4, 2/6 SEM RUSB.  No peripheral edema.  No carotid bruit.  2+ right PT, 1+ left PT pulse.  Abdomen: Soft, nontender, no hepatosplenomegaly, no distention.  Skin: Intact without lesions or rashes.  Neurologic: Alert and oriented x 3.  Psych: Normal affect. Extremities: No clubbing or cyanosis.  HEENT: Normal.   Labs:   Lab Results  Component Value Date   WBC 6.4 08/27/2011   HGB 14.2 08/27/2011   HCT 41.4 08/27/2011   MCV 90.4 08/27/2011   PLT 239 08/27/2011   No results found for this basename: NA,K,CL,CO2,BUN,CREATININE,CALCIUM,LABALBU,PROT,BILITOT,ALKPHOS,ALT,AST,GLUCOSE in the last 168 hours No results found for this basename: CKTOTAL, CKMB, CKMBINDEX, TROPONINI    No results found for this basename: CHOL   No results found for this basename: HDL   No results found for this basename: LDLCALC   No results found for this basename: TRIG   No results found  for this basename: CHOLHDL   No results found for this basename: LDLDIRECT      Radiology:CXR pending ZOX:WRUEA bradycardia at 47 with 1st degree AV block  ASSESSMENT AND PLAN: 77 yo with history CAD s/p CABG presented with lightheaded spells and long pauses on event monitoring.  1. Bradycardia: Suspect sick sinus syndrome with up to 6.5 second pauses noted on home monitoring.  He has been symptomatic with lightheadedness but has not passed out.  He is on diltiazem CD, which we will stop, but given the length of his pauses I think that he is going to need a pacemaker.  - We have called [employer] to have the monitor strips faxed over (will  come to ER).  - Monitor at bedside with pacer pads attached and Atropine to bedside.  - Keep NPO, likely PCM later this morning.  - Will check cardiac enzymes but doubt ischemia as cause.  Will check echo prior to [facility] placement.  2. CAD: Stable without ischemic symptoms.  Continue ASA and statin.  3. Murmur: Murmur of aortic sclerosis or mild AS.  Will get echo.   Signed: Marca Ancona 08/27/2011, 4:25 AM

## 2011-08-27 NOTE — ED Notes (Signed)
Patient is resting comfortably; family member at bedside; pt on zoll.

## 2011-08-27 NOTE — ED Notes (Signed)
Pt's belongings in a bag given to daughter who is at bedside.

## 2011-08-27 NOTE — ED Notes (Signed)
PT placed on zoll with pacer pads per MD.

## 2011-08-27 NOTE — Op Note (Signed)
DDD PPM inserted via left subclavian vein without immediate complication. W#[id number].

## 2011-08-27 NOTE — Consult Note (Signed)
ELECTROPHYSIOLOGY CONSULT NOTE    Patient ID: HARUTO DEMARIA MRN: [id number], DOB/AGE: February 20, 1931 77 y.o.  Admit date: 08/27/2011 Date of Consult:08-27-2011  Primary Physician: Pearla Dubonnet, MD Primary Cardiologist: Garnette Scheuermann, MD  Reason for Consultation: syncope and sinus pauses  HPI: Mr. Grose is a 77 year old male with a history of CAD status post CABG.  For the past few months, he has been having spells of lightheadedness during waking hours that has caused him to stop playing golf because of fears of passing out.  An event monitor was placed by Dr Katrinka Blazing 2 days ago.  Last night, he had a spell of presyncope that was associated with a prolonged pause of 6 seconds on event monitor.  He was advised by the event monitoring company to come to the ER for evaluation. He denies frank syncope. No chest pain.  Past Medical History  Diagnosis Date  . Hypertension   . Coronary artery disease      Surgical History:  Past Surgical History  Procedure Date  . Coronary artery bypass graft      Prescriptions prior to admission  Medication Sig Dispense Refill  . diltiazem (CARDIZEM CD) 240 MG 24 hr capsule Take 240 mg by mouth daily.        . Multiple Vitamins-Minerals (MULTIVITAMINS THER. W/MINERALS) TABS Take 1 tablet by mouth daily.        Marland Kitchen PRESCRIPTION MEDICATION Blood pressure medication       . simvastatin (ZOCOR) 10 MG tablet Take 10 mg by mouth at bedtime.          Inpatient Medications:     . aspirin EC  81 mg Oral Daily  . chlorhexidine      . heparin  5,000 Units Subcutaneous Q8H  . simvastatin  20 mg Oral q1800  . sodium chloride  3 mL Intravenous Q12H    Allergies:  Allergies  Allergen Reactions  . Zyrtec (Cetirizine Hcl) Other (See Comments)    Unknown   . Penicillins Rash    History   Social History  . Marital Status: Married    Spouse Name: N/A    Number of Children: N/A  . Years of Education: N/A   Occupational History  . retired     retired  Acupuncturist   Social History Main Topics  . Smoking status: Never Smoker   . Smokeless tobacco: Not on file  . Alcohol Use: No  . Drug Use: No  . Sexually Active:    Social History Narrative   Moved from [place] in 1958     Family History-  No premature CAD  Labs:   Lab Results  Component Value Date   WBC 6.4 08/27/2011   HGB 13.6 08/27/2011   HCT 40.1 08/27/2011   MCV 89.5 08/27/2011   PLT 200 08/27/2011     Lab 08/27/11 0506 08/27/11 0403  NA -- 138  K -- 4.1  CL -- 102  CO2 -- 28  BUN -- 12  CREATININE 0.87 --  CALCIUM -- 9.1  PROT -- 7.0  BILITOT -- 0.4  ALKPHOS -- 63  ALT -- 20  AST -- 19  GLUCOSE -- 116*   Lab Results  Component Value Date   CKTOTAL 67 08/27/2011   CKMB 2.3 08/27/2011   TROPONINI <0.30 08/27/2011   Physical Exam   Well appearing NAD HEENT: Unremarkable Neck:  No JVD, no thyromegally Lungs:  Clear with no wheezes. HEART:  Regular rate rhyth  with a soft systolic murmur, no rubs, no clicks Abd:  Flat, positive bowel sounds, no organomegally, no rebound, no guarding Ext:  2 plus pulses, no edema, no cyanosis, no clubbing Skin:  No rashes no nodules Neuro:  CN II through XII intact, motor grossly intact   Radiology/Studies: Dg Chest Port 1 View 08/27/2011  *RADIOLOGY REPORT*  Clinical Data: Dizziness and irregular heart rate  PORTABLE CHEST - 1 VIEW  Comparison: 06/16/2010  Findings: Stable postoperative changes in the chest.  Shallow inspiration.  Calcified plaques over the right hemidiaphragm. Probable emphysematous changes and fibrosis in the lungs.  No focal airspace consolidation.  Tortuous aorta.  No blunting of costophrenic angles.  Linear shadow projected over the left lung appears to be artifact.  Lung markings extend peripheral to this line.  IMPRESSION: Shallow inspiration.  Calcified pleural plaques.  No significant change since previous study.  No evidence of active pulmonary disease.  Original Report Authenticated By:  Marlon Pel, M.D.   FAO:ZHYQM bradycardia with first degree AV block  TELEMETRY: sinus rhythm/sinus bradycardia  1. Symptomatic bradycardia - his spells appear to be due to prolonged sinus arrest. I have discussed the risks/benefits/goals/expectations of PPM with the patient and his family and they wish to proceed. 2. CAD - no evidence of underlying ischemia.

## 2011-08-27 NOTE — Progress Notes (Signed)
Shaun James has a several month history of lightheadedness and near syncope. Within the past 48 Hours We placed an event monitor and it had several notifications related to pauses between 3.5 and 6 seconds. Last evening the longest pause was associated with symptoms. The patient was not asleep. The symptom was identical/ very similar to those that have been occurring for quite some time.  Dr. Lewayne Bunting was consulted to consider placement of a permanent DDD pacemaker.  The patient has had a recent nuclear perfusion study that was negative for ischemia.  Once the pacemaker is successfully implanted, he would be eligible for discharge. I do not feel we need to make any changes in his usual medications. 24 hours ago we discontinued diltiazem.

## 2011-08-27 NOTE — ED Notes (Addendum)
RN to call back to receive report. Pt in no distress.

## 2011-08-27 NOTE — ED Notes (Signed)
Patient is resting comfortably; pt on zoll

## 2011-08-28 ENCOUNTER — Inpatient Hospital Stay (HOSPITAL_COMMUNITY): Payer: Medicare Other

## 2011-08-28 NOTE — Discharge Summary (Signed)
Patient ID: ANKITH EDMONSTON MRN: [id number] DOB/AGE: 11-20-1930 77 y.o.  Admit date: 08/27/2011 Discharge date: 08/28/2011  Primary Discharge Diagnosis -  Symptomatic bradycardia due to prolonged sinus arrest  Secondary Discharge Diagnosis   Pacemaker implantation-[employer]-Dr. Lewayne Bunting  Hypertension  Coronary artery bypass graft - 1998, last catheterization in July of 2009 with patent SVG to diagonal-40% proximal stenosis, patent SVG to OM, SVG to distal RCA, LIMA to LAD. EF 65%.  CAD  Hyperlipidemia  Left rotator cuff tear  Consults: Dr. Ladona Ridgel of electrophysiology for pacemaker implantation  Hospital Course: 77 year old male with coronary artery disease status post bypass with frequent spells of lightheadedness who was sent to ER after long pulses were noted on cardiac event monitor. 6.4 seconds at max. Having these lightheaded spells on the golf course. Eyes any chest pain, shortness of breath with exertion. Dr. Ladona Ridgel was consulted and implanted pacemaker. [employer].   Discharge Exam: Blood pressure 105/71, pulse 76, temperature 97.7 F (36.5 C), temperature source Oral, resp. rate 20, height 5\' 5"  (1.651 m), weight 60.3 kg (132 lb 15 oz), SpO2 94.00%.   General appearance: alert and cooperative Chest wall: no tenderness Cardio: regular rate and rhythm, S1, S2 normal, no murmur, click, rub or gallop GI: soft, non-tender; bowel sounds normal; no masses,  no organomegaly Extremities: extremities normal, atraumatic, no cyanosis or edema Incision/Wound: at pacemaker site clean dry and intact, dressed Labs:   Lab Results  Component Value Date   WBC 6.4 08/27/2011   HGB 13.6 08/27/2011   HCT 40.1 08/27/2011   MCV 89.5 08/27/2011   PLT 200 08/27/2011    Lab 08/27/11 0506 08/27/11 0403  NA -- 138  K -- 4.1  CL -- 102  CO2 -- 28  BUN -- 12  CREATININE 0.87 --  CALCIUM -- 9.1  PROT -- 7.0  BILITOT -- 0.4  ALKPHOS -- 63  ALT -- 20  AST -- 19    GLUCOSE -- 116*   Lab Results  Component Value Date   CKTOTAL 67 08/27/2011   CKMB 2.3 08/27/2011   TROPONINI <0.30 08/27/2011      Radiology: Chest x-ray personally viewed from this morning shows dual-chamber pacemaker with no evidence of pneumothorax or other lung disease.  EKG: Pacemaker interrogation performed post implantation. Performing well.  FOLLOW UP PLANS AND APPOINTMENTS Discharge Orders    Future Orders Please Complete By Expires   Diet - low sodium heart healthy      Increase activity slowly      Discharge instructions      Comments:   Pacer instruction sheet.     Current Discharge Medication List    CONTINUE these medications which have NOT CHANGED   Details  aspirin 325 MG tablet Take 325 mg by mouth daily.      celecoxib (CELEBREX) 200 MG capsule Take 200 mg by mouth daily as needed. For pain     Coenzyme Q10 (CO Q 10 PO) Take 1 tablet by mouth daily.      fish oil-omega-3 fatty acids 1000 MG capsule Take 1 g by mouth daily.      GLUCOSAMINE HCL PO Take 2 tablets by mouth daily.      nitroGLYCERIN (NITROSTAT) 0.4 MG SL tablet Place 0.4 mg under the tongue every 5 (five) minutes as needed. For chest pain     OVER THE COUNTER MEDICATION Take 1 tablet by mouth daily as needed. mamaw's nausea med for nausea     ranolazine (RANEXA)  500 MG 12 hr tablet Take 500 mg by mouth 2 (two) times daily.      simvastatin (ZOCOR) 40 MG tablet Take 20 mg by mouth at bedtime.      Tamsulosin HCl (FLOMAX) 0.4 MG CAPS Take 0.4 mg by mouth daily.      valsartan (DIOVAN) 320 MG tablet Take 80 mg by mouth daily.      vitamin E 100 UNIT capsule Take 100 Units by mouth daily.         Follow-up Information    Follow up with GATES,ROBERT NEVILL, MD .        Followup will also be made with Dr. Lubertha [facility]que EP clinic for post pacemaker implantation.   You'll followup with Dr. Katrinka Blazing as previously scheduled.  BRING ALL MEDICATIONS WITH YOU TO FOLLOW UP APPOINTMENTS  Time  spent with patient to include physician time: 37 minutes with review of medical records, pacemaker interrogation, review of chest x-ray, review of medications. Discussion with patient. SignedDonato Schultz 08/28/2011, 9:04 AM

## 2011-08-28 NOTE — Op Note (Signed)
Shaun James, Shaun James NO.:  [id number]  MEDICAL RECORD NO.:  [id number]  LOCATION:  3743                         FACILITY:  [facility]  PHYSICIAN:  Doylene Canning. Ladona Ridgel, MD    DATE OF BIRTH:  1931/06/29  DATE OF PROCEDURE:  08/27/2011 DATE OF DISCHARGE:                              OPERATIVE REPORT   PROCEDURE PERFORMED:  Insertion of a dual-chamber pacemaker.  INDICATION:  Symptomatic bradycardia with marked AV conduction disease.  INTRODUCTION:  The patient is a 77 year old man who has had a 75-month history of near-syncopal spells.  He has not had frank syncope.  He subsequently worn a cardiac monitor, which demonstrated daytime pauses (non sleeping) of 6 seconds.  He is now referred for permanent pacemaker insertion.  PROCEDURE:  After informed consent was obtained, the patient was taken to the diagnostic EP lab in a fasting state.  After usual preparation and draping, intravenous fentanyl and midazolam was given for sedation. Lidocaine 30 mL was infiltrated into the left infraclavicular region.  A 5 cm incision was carried out over this region and electrocautery was utilized to dissect down to the fascial plane.  The left subclavian vein was punctured x2 and the Guidant dextrous model 4136, active fixation defibrillation lead, serial #[id number] was advanced into the right ventricle and the Guidant dextrous model 4135, active fixation atrial lead, serial #[id number] was advanced into the right atrium.  Mapping was carried out initially in the right ventricle.  At the final site, the R- waves measured greater than 20 mV, the pacing impedance with lead actively fixed was 800 ohms and the threshold was 0.6 V at 0.4 msec.  A 10 V pacing did not stimulate the diaphragm and there was a large injury current with active fixation of the lead.  Attention was then turned to placement of the atrial lead.  Mapping in the atrium demonstrated diffuse atriopathy with low P waves  throughout.  At the final site, the P-waves measured nearly 3 mV, the pacing impedance was 600 ohms and with the lead actively fixed, the threshold was 1.2 V at 0.4 msec.  A 10 V pacing did not stimulate the diaphragm.  There was an average size injury current with active fixation of the lead.  With both the atrial and ventricular leads in satisfactory position, they were secured to the subpectoral fascia with a figure-of-eight silk suture.  Sewing sleeve was secured with silk suture.  At this point, electrocautery was utilized to make subcutaneous pocket.  Antibiotic irrigation was utilized to irrigate the pocket.  Electrocautery was utilized to assure hemostasis.  The [facility] [employer] Advantio dual-chamber pacemaker, serial [id number] was connected to the atrial and ventricular leads and placed back in the subcutaneous pocket.  The generator was secured with silk suture.  The pocket was additionally irrigated and the incision was closed with 2-0 and 3-0 Vicryl.  Benzoin and Steri-Strips were painted on the skin, pressure dressing was applied, and the patient was returned to his room in satisfactory condition.  COMPLICATIONS:  There were no immediate procedure complications.  RESULTS:  This demonstrates successful implantation of a [employer] dual-chamber pacemaker in a patient with  symptomatic bradycardia.     Doylene Canning. Ladona Ridgel, MD     GWT/MEDQ  D:  08/27/2011  T:  08/28/2011  Job:  [id number]  cc:   Lyn Records III

## 2011-09-01 ENCOUNTER — Telehealth: Payer: Self-pay | Admitting: Internal Medicine

## 2011-09-08 ENCOUNTER — Encounter: Payer: Self-pay | Admitting: Internal Medicine

## 2011-09-08 ENCOUNTER — Ambulatory Visit (INDEPENDENT_AMBULATORY_CARE_PROVIDER_SITE_OTHER): Payer: 59 | Admitting: *Deleted

## 2011-09-08 DIAGNOSIS — I498 Other specified cardiac arrhythmias: Secondary | ICD-10-CM

## 2011-09-08 LAB — PACEMAKER DEVICE OBSERVATION
AL AMPLITUDE: 4.1 mv
AL THRESHOLD: 0.6 V
RV LEAD AMPLITUDE: 25 mv
RV LEAD IMPEDENCE PM: 713 Ohm
RV LEAD THRESHOLD: 0.5 V

## 2011-09-08 NOTE — Progress Notes (Signed)
Wound check-PPM 

## 2011-09-11 ENCOUNTER — Emergency Department (HOSPITAL_COMMUNITY)
Admission: EM | Admit: 2011-09-11 | Discharge: 2011-09-11 | Disposition: A | Discharge status: Home / Self Care | Payer: Medicare Other | Attending: Emergency Medicine | Admitting: Emergency Medicine

## 2011-09-11 ENCOUNTER — Encounter (HOSPITAL_COMMUNITY): Payer: Self-pay | Admitting: Emergency Medicine

## 2011-09-11 ENCOUNTER — Other Ambulatory Visit: Payer: Self-pay

## 2011-09-11 ENCOUNTER — Emergency Department (HOSPITAL_COMMUNITY): Payer: Medicare Other

## 2011-09-11 DIAGNOSIS — I4892 Unspecified atrial flutter: Secondary | ICD-10-CM

## 2011-09-11 DIAGNOSIS — I252 Old myocardial infarction: Secondary | ICD-10-CM | POA: Insufficient documentation

## 2011-09-11 DIAGNOSIS — R079 Chest pain, unspecified: Secondary | ICD-10-CM | POA: Insufficient documentation

## 2011-09-11 DIAGNOSIS — I2510 Atherosclerotic heart disease of native coronary artery without angina pectoris: Secondary | ICD-10-CM | POA: Insufficient documentation

## 2011-09-11 DIAGNOSIS — Z79899 Other long term (current) drug therapy: Secondary | ICD-10-CM | POA: Insufficient documentation

## 2011-09-11 DIAGNOSIS — R55 Syncope and collapse: Secondary | ICD-10-CM

## 2011-09-11 DIAGNOSIS — Z950 Presence of cardiac pacemaker: Secondary | ICD-10-CM | POA: Insufficient documentation

## 2011-09-11 DIAGNOSIS — I10 Essential (primary) hypertension: Secondary | ICD-10-CM | POA: Insufficient documentation

## 2011-09-11 DIAGNOSIS — R42 Dizziness and giddiness: Secondary | ICD-10-CM | POA: Insufficient documentation

## 2011-09-11 DIAGNOSIS — Z7982 Long term (current) use of aspirin: Secondary | ICD-10-CM | POA: Insufficient documentation

## 2011-09-11 LAB — CBC
Hemoglobin: 14.8 g/dL (ref 13.0–17.0)
MCH: 31.6 pg (ref 26.0–34.0)
MCHC: 35.4 g/dL (ref 30.0–36.0)
MCV: 89.3 fL (ref 78.0–100.0)
RBC: 4.68 MIL/uL (ref 4.22–5.81)

## 2011-09-11 LAB — POCT I-STAT, CHEM 8
BUN: 10 mg/dL (ref 6–23)
Calcium, Ion: 1.24 mmol/L (ref 1.12–1.32)
Creatinine, Ser: 1 mg/dL (ref 0.50–1.35)
Glucose, Bld: 88 mg/dL (ref 70–99)
Hemoglobin: 15.3 g/dL (ref 13.0–17.0)
TCO2: 28 mmol/L (ref 0–100)

## 2011-09-11 MED ORDER — METOPROLOL TARTRATE 25 MG PO TABS
50.0000 mg | ORAL_TABLET | Freq: Once | ORAL | Status: AC
  Administered 2011-09-11: 50 mg via ORAL
  Filled 2011-09-11 (×2): qty 2

## 2011-09-11 MED ORDER — METOPROLOL TARTRATE 1 MG/ML IV SOLN
5.0000 mg | INTRAVENOUS | Status: DC | PRN
  Administered 2011-09-11 (×3): 5 mg via INTRAVENOUS
  Filled 2011-09-11: qty 15

## 2011-09-11 MED ORDER — SODIUM CHLORIDE 0.9 % IV SOLN
Freq: Once | INTRAVENOUS | Status: AC
  Administered 2011-09-11: 14:00:00 via INTRAVENOUS

## 2011-09-11 MED ORDER — METOPROLOL TARTRATE 100 MG PO TABS: 50.0000 mg | ORAL_TABLET | Freq: Two times a day (BID) | ORAL | Status: DC

## 2011-09-11 NOTE — ED Notes (Signed)
Cardiology at bedside along with [facility] [employer] to interrogate pacemaker.

## 2011-09-11 NOTE — ED Provider Notes (Addendum)
History     CSN: [id number]  Arrival date & time 09/11/11  1244   First MD Initiated Contact with Patient 09/11/11 1307      Chief Complaint  Patient presents with  . Chest Pain   patient presents with weakness and near syncopal episode after being on the treadmill. This morning. Of note, he recently had pacemaker placement for sinus pauses and near syncope. He states today is the first time that he try to exercise since his procedure and surgery. He does note some chest pressure that he had earlier which has resolved. He had no nausea or vomiting, but did feel faint. He also, states that his heart rate was ranging from rates in the 40s to rate in the 120s. He also, states his blood pressure has been fluctuating. At this time. He appears to be stable on the cardiac monitor. Previous records have been reviewed.  (Consider location/radiation/quality/duration/timing/severity/associated sxs/prior treatment) HPI  Past Medical History  Diagnosis Date  . Hypertension   . Coronary artery disease   . Shortness of breath   . Myocardial infarction   . Syncope     Past Surgical History  Procedure Date  . Coronary artery bypass graft   . Insert / replace / remove pacemaker 08/27/2011  . Hemorrhoid surgery   . Cataracts     bilateral    History reviewed. No pertinent family history.  History  Substance Use Topics  . Smoking status: Never Smoker   . Smokeless tobacco: Never Used  . Alcohol Use: No      Review of Systems  All other systems reviewed and are negative.    Allergies  Zyrtec and Penicillins  Home Medications   Current Outpatient Rx  Name Route Sig Dispense Refill  . ASPIRIN 325 MG PO TABS Oral Take 325 mg by mouth daily.      . CELECOXIB 200 MG PO CAPS Oral Take 200 mg by mouth daily as needed. For pain     . CO Q 10 PO Oral Take 1 tablet by mouth daily.      . OMEGA-3 FATTY ACIDS 1000 MG PO CAPS Oral Take 1 g by mouth daily.      Marland Kitchen GLUCOSAMINE HCL PO Oral  Take 2 tablets by mouth daily.      Marland Kitchen NITROGLYCERIN 0.4 MG SL SUBL Sublingual Place 0.4 mg under the tongue every 5 (five) minutes as needed. For chest pain     . OVER THE COUNTER MEDICATION Oral Take 1 tablet by mouth daily as needed. mamaw's nausea med for nausea     . RANOLAZINE 500 MG PO TB12 Oral Take 500 mg by mouth 2 (two) times daily.      Marland Kitchen SIMVASTATIN 40 MG PO TABS Oral Take 20 mg by mouth at bedtime.      . TAMSULOSIN HCL 0.4 MG PO CAPS Oral Take 0.4 mg by mouth daily.      Marland Kitchen VALSARTAN 320 MG PO TABS Oral Take 80 mg by mouth daily.      Marland Kitchen VITAMIN E 100 UNITS PO CAPS Oral Take 100 Units by mouth daily.        BP 95/76  Pulse 66  Temp(Src) 97.5 F (36.4 C) (Oral)  Resp 16  SpO2 95%  Physical Exam  Nursing note and vitals reviewed. Constitutional: He is oriented to person, place, and time. He appears well-developed and well-nourished.  HENT:  Head: Normocephalic and atraumatic.  Eyes: Conjunctivae and EOM are normal. Pupils are  equal, round, and reactive to light.  Neck: Neck supple.  Cardiovascular: Normal rate and regular rhythm.  Exam reveals no gallop and no friction rub.   No murmur heard. Pulmonary/Chest: Breath sounds normal. He has no wheezes. He has no rales. He exhibits no tenderness.       Incision intact, no redness.  Abdominal: Soft. Bowel sounds are normal. He exhibits no distension. There is no tenderness. There is no rebound and no guarding.  Musculoskeletal: Normal range of motion. He exhibits no edema and no tenderness.  Neurological: He is alert and oriented to person, place, and time. No cranial nerve deficit. Coordination normal.  Skin: Skin is warm and dry. No rash noted.  Psychiatric: He has a normal mood and affect.    ED Course  Procedures (including critical care time)   Labs Reviewed  CBC  I-STAT TROPONIN I  I-STAT, CHEM 8   No results found.   No diagnosis found.    MDM  Pt is seen and examined;  Initial history and physical  completed.  Will follow.        Patient ID:  Shaun James  MRN: [id number]  DOB/AGE: 1931-04-21 77 y.o.  Admit date: 08/27/2011  Discharge date: 08/28/2011  Primary Discharge Diagnosis - Symptomatic bradycardia due to prolonged sinus arrest  Secondary Discharge Diagnosis  Pacemaker implantation-[employer]-Dr. Lewayne Bunting  Hypertension  Coronary artery bypass graft - 1998, last catheterization in July of 2009 with patent SVG to diagonal-40% proximal stenosis, patent SVG to OM, SVG to distal RCA, LIMA to LAD. EF 65%.  CAD  Hyperlipidemia  Left rotator cuff tear  Consults: Dr. Ladona Ridgel of electrophysiology for pacemaker implantation  Hospital Course: 77 year old male with coronary artery disease status post bypass with frequent spells of lightheadedness who was sent to ER after long pulses were noted on cardiac event monitor. 6.4 seconds at max. Having these lightheaded spells [place]. Eyes any chest pain, shortness of breath with exertion. Dr. Ladona Ridgel was consulted and implanted pacemaker. [employer].  Discharge Exam:  Blood pressure 105/71, pulse 76, temperature 97.7 F (36.5 C), temperature source Oral, resp. rate 20, height 5\' 5"  (1.651 m), weight 60.3 kg (132 lb 15 oz), SpO2 94.00%.  General appearance: alert and cooperative  Chest wall: no tenderness  Cardio: regular rate and rhythm, S1, S2 normal, no murmur, click, rub or gallop  GI: soft, non-tender; bowel sounds normal; no masses, no organomegaly  Extremities: extremities normal, atraumatic, no cyanosis or edema  Incision/Wound: at pacemaker site clean dry and intact, dressed  Labs:     Lab Results     Component  Value  Date      WBC  6.4  08/27/2011      HGB  13.6  08/27/2011      HCT  40.1  08/27/2011      MCV  89.5  08/27/2011      PLT  200  08/27/2011     Lab  08/27/11 0506  08/27/11 0403      NA  --  138      K  --  4.1      CL  --  102      CO2  --  28      BUN  --  12      CREATININE  0.87   --      CALCIUM  --  9.1      PROT  --  7.0  BILITOT  --  0.4      ALKPHOS  --  63      ALT  --  20      AST  --  19      GLUCOSE  --  116*      Lab Results      Component  Value  Date       CKTOTAL  67  08/27/2011       CKMB  2.3  08/27/2011       TROPONINI  <0.30  08/27/2011      Radiology: Chest x-ray personally viewed from this morning shows dual-chamber pacemaker with no evidence of pneumothorax or other lung disease.  EKG: Pacemaker interrogation performed post implantation. Performing well.  FOLLOW UP PLANS AND APPOINTMENTS      Discharge Orders       Future Orders  Please Complete By  Expires       Diet - low sodium heart healthy         Increase activity slowly         Discharge instructions         Comments:       Pacer instruction sheet.            1:21 PM Previous records reviewed, initial workup ordered. Initial vital stable. Will follow closely and consult cardiology if initial studies. Return    Date: 09/11/2011  Rate: 90  Rhythm: atrial flutter  QRS Axis: normal  Intervals: variable  ST/T Wave abnormalities: nonspecific ST/T changes  Conduction Disutrbances:none  Narrative Interpretation:   Old EKG Reviewed: changes noted    1:36 PM Card paged  [employer] paged  1:50 PM Dr Isabel Caprice contacted, will see pt in ED for possible admission.   Results for orders placed during the hospital encounter of 09/11/11  CBC      Component Value Range   WBC 6.6  4.0 - 10.5 (K/uL)   RBC 4.68  4.22 - 5.81 (MIL/uL)   Hemoglobin 14.8  13.0 - 17.0 (g/dL)   HCT 16.1  09.6 - 04.5 (%)   MCV 89.3  78.0 - 100.0 (fL)   MCH 31.6  26.0 - 34.0 (pg)   MCHC 35.4  30.0 - 36.0 (g/dL)   RDW 40.9  81.1 - 91.4 (%)   Platelets 227  150 - 400 (K/uL)  POCT I-STAT, CHEM 8      Component Value Range   Sodium 140  135 - 145 (mEq/L)   Potassium 4.3  3.5 - 5.1 (mEq/L)   Chloride 105  96 - 112 (mEq/L)   BUN 10  6 - 23 (mg/dL)   Creatinine, Ser 7.82  0.50 - 1.35 (mg/dL)    Glucose, Bld 88  70 - 99 (mg/dL)   Calcium, Ion 9.56  2.13 - 1.32 (mmol/L)   TCO2 28  0 - 100 (mmol/L)   Hemoglobin 15.3  13.0 - 17.0 (g/dL)   HCT 08.6  57.8 - 46.9 (%)  POCT I-STAT TROPONIN I      Component Value Range   Troponin i, poc 0.02  0.00 - 0.08 (ng/mL)   Comment 3            Dg Chest 2 View  08/28/2011  *RADIOLOGY REPORT*  Clinical Data: Pacemaker insertion.  CHEST - 2 VIEW  Comparison: Chest x-ray 08/27/2011.  Findings: The permanent left-sided pacemaker is in good position. The right atrial and ventricular wires are in good position.  No complicating features.  The lungs are clear.  Stable pleural calcification of the right lung base.  IMPRESSION: Pacer wires in good position without complicating features.  Original Report Authenticated By: P. Loralie Champagne, M.D.   Dg Chest Port 1 View  08/27/2011  *RADIOLOGY REPORT*  Clinical Data: Dizziness and irregular heart rate  PORTABLE CHEST - 1 VIEW  Comparison: 06/16/2010  Findings: Stable postoperative changes in the chest.  Shallow inspiration.  Calcified plaques over the right hemidiaphragm. Probable emphysematous changes and fibrosis in the lungs.  No focal airspace consolidation.  Tortuous aorta.  No blunting of costophrenic angles.  Linear shadow projected over the left lung appears to be artifact.  Lung markings extend peripheral to this line.  IMPRESSION: Shallow inspiration.  Calcified pleural plaques.  No significant change since previous study.  No evidence of active pulmonary disease.  Original Report Authenticated By: Marlon Pel, M.D.      2:14 PM   Remains stable. pending consultation/admission from cardiology.  Koran Seabrook A. Patrica Duel, MD 09/11/11 1415  4:01 PM  All from the cardiology group has seen and evaluated the patient. They have given him metoprolol in the ED and are recommending that we discharge the patient. Home on a course of metoprolol, 50 mg, 2 times per day, recommending quantity sufficient for 30  days, and no refills. We will discharge Mr. Paluch home at the termination of the cardiologist. Discharged home in stable and improved condition. Understands to followup with his primary cardiologist early this week and to return to the ED for any concerns  Lindsey Demonte A. Patrica Duel, MD 09/11/11 1601

## 2011-09-11 NOTE — Progress Notes (Signed)
Patient ID: Shaun James, male   DOB: 12-18-1930, 77 y.o.   MRN: [id number] CARDIOLOGY CONSULT NOTE  Patient ID: Shaun James MRN: [id number] DOB/AGE: August 06, 1931 77 y.o.  Admit date: 09/11/2011 Referring Physician: Dr Patrica Duel Primary Physician:  Primary Cardiologist: Lewayne Bunting MD Reason for Consultation: Atrial Flutter  HPI: [name] is a delightful 77 year old married male who comes in today with presyncope and variation in his heart rate.  His cardiac history is remarkable for recent pacemaker implantation for symptomatic pulses. I do not see history of A. fib or flutter. Recent pacemaker check in the office was normal.  Today, he woke up this treadmill. He felt hot and didn't feel well in general. He rested and felt lightheaded and fell asleep. He did not fall and there was no reported syncope.  After he awoke, he felt poorly. We tried to check his blood pressure it was running in the 90s which is low for him. His heart rate was also running up and down between 50 and over 100.  In the emergency room, his EKG shows atrial flutter with occasional ventricular pacing. His monitor shows mostly flutter with a heart rate between 90 and 120.  He did not have any chest pain or angina. Again he had no syncope.  Reviewing his medications, he is not on any rate slowing drugs. He remembers taking Lopressor in the past. He is on Ranexa which will not allow [place] to use any diltiazem or verapamil.   Review of systems complete and found to be negative unless listed above.   Past Medical History  Diagnosis Date  . Hypertension   . Coronary artery disease   . Shortness of breath   . Myocardial infarction   . Syncope     History reviewed. No pertinent family history.  History   Social History  . Marital Status: Married    Spouse Name: N/A    Number of Children: N/A  . Years of Education: N/A   Occupational History  . retired     retired Acupuncturist   Social History Main Topics  .  Smoking status: Never Smoker   . Smokeless tobacco: Never Used  . Alcohol Use: No  . Drug Use: No  . Sexually Active: Not Currently   Other Topics Concern  . Not on file   Social History Narrative   Moved from [place] in 1958    Past Surgical History  Procedure Date  . Coronary artery bypass graft   . Insert / replace / remove pacemaker 08/27/2011  . Hemorrhoid surgery   . Cataracts     bilateral      (Not in a hospital admission)   Intake/Output previous 24 hours:   Intake/Output previous shift:    Physical Exam: Vitals:  He currently has a blood pressure 120-130/60. Heart rate is 90-120 and a flutter. EKG shows no acute changes. He has occasional V. pacing. Respiratory rate 18 unlabored  is afebrile.  BMI:  General Appearance: well developed, well nourished in no acute distress HEENT: symmetrical face, PERRLA, good dentition  Neck: no JVD, thyromegaly, or adenopathy, trachea midline Chest: symmetric without deformity Cardiac: PMI non-displaced, RRR, normal S1, S2, no gallop or murmur Lung: clear to ausculation and percussion Vascular: all pulses full without bruits  Abdominal: nondistended, nontender, good bowel sounds, no HSM, no bruits Extremities: no cyanosis, clubbing or edema, no sign of DVT, no varicosities  Skin: normal color, no rashes Neuro: alert and oriented x 3, non-focal  Pysch: normal affect  Labs:   Lab Results  Component Value Date   WBC 6.6 09/11/2011   HGB 15.3 09/11/2011   HCT 45.0 09/11/2011   MCV 89.3 09/11/2011   PLT 227 09/11/2011    Lab 09/11/11 1347  NA 140  K 4.3  CL 105  CO2 --  BUN 10  CREATININE 1.00  CALCIUM --  PROT --  BILITOT --  ALKPHOS --  ALT --  AST --  GLUCOSE 88   Lab Results  Component Value Date   CKTOTAL 67 08/27/2011   CKMB 2.3 08/27/2011   TROPONINI <0.30 08/27/2011    No results found for this basename: CHOL   No results found for this basename: HDL   No results found for this basename: LDLCALC    No results found for this basename: TRIG   No results found for this basename: CHOLHDL   No results found for this basename: LDLDIRECT    @RESUBNP @ Lab Results  Component Value Date   TSH 6.439* 08/27/2011   BNP: No components found with this basename: POCBNP:3 D-Dimer: No results found for this basename: DDIMER:2 in the last 72 hours Hemoglobin A1C: No results found for this basename: HGBA1C in the last 72 hours No results found for this basename: APTT,INR,PTT in the last 168 hours     Radiology: Dg Chest 2 View  09/11/2011  *RADIOLOGY REPORT*  Clinical Data: Cardiac dysrhythmia.  Pacemaker.  CHEST - 2 VIEW  Comparison: 08/28/2011  Findings: Dual lead pacer noted with proximal distal leads projecting over the right atrium and ventricle, respectively. Prior CABG noted.  There is tortuosity of the thoracic aorta.  No current cardiomegaly or edema.  Stable calcifications noted along the right hemidiaphragm.  The lungs remain clear.  No pleural effusion is observed.  IMPRESSION:  1.  Stable appearance of the chest, without acute abnormality observed.  Original Report Authenticated By: Dellia Cloud, M.D.   EKG: Atrial flutter with variable A-V block with ventricular pacing. ASSESSMENT AND PLAN:  [name] has a new diagnosis of atrial flutter. He is hemodynamically stable but did feel weak at one point felt presyncopal. He feels very strongly about going home.   Since he is on Ranexa, we'll stay away from calcium channel blockers. We'll give 5 mg of IV metoprolol every 5 minutes for a total of 15 mg. We'll monitor closely. After the IV metoprolol is given, will give 50 mg of metoprolol tartrate. If he is stable and rate is well controlled will discharge home on metoprolol tartrate 50 mg twice a day. We'll schedule for close followup in the office.   Valera Castle, MD  09/11/2011, 3:20 PM

## 2011-09-11 NOTE — ED Notes (Signed)
Patient is alert and oriented.  Skin warm and dry.  Patient reports he has a pressure in his lower chest/upper abd on the left side.  Patient had pacemaker placed 2 weeks ago.  Patient with noted demand paced rhythm on the monitor.  Family at bedside.  Awaiting xray at this time.   [employer] has been called to interrogate his pacemaker.

## 2011-09-11 NOTE — ED Notes (Signed)
Pt had pacemaker placed 2 weeks ago.

## 2011-09-11 NOTE — ED Notes (Signed)
Pt reports was on a treadmill at home. Did a brisk walk then felt heart racing and like he was going to pass out. Pt put heart monitor on and noticed HR from 45 to 127.

## 2011-09-13 ENCOUNTER — Encounter: Payer: Self-pay | Admitting: Physician Assistant

## 2011-09-13 ENCOUNTER — Ambulatory Visit (INDEPENDENT_AMBULATORY_CARE_PROVIDER_SITE_OTHER): Payer: Medicare Other | Admitting: Physician Assistant

## 2011-09-13 DIAGNOSIS — I495 Sick sinus syndrome: Secondary | ICD-10-CM

## 2011-09-13 DIAGNOSIS — I25810 Atherosclerosis of coronary artery bypass graft(s) without angina pectoris: Secondary | ICD-10-CM | POA: Insufficient documentation

## 2011-09-13 DIAGNOSIS — I4892 Unspecified atrial flutter: Secondary | ICD-10-CM | POA: Insufficient documentation

## 2011-09-13 DIAGNOSIS — I2510 Atherosclerotic heart disease of native coronary artery without angina pectoris: Secondary | ICD-10-CM

## 2011-09-13 MED ORDER — METOPROLOL TARTRATE 50 MG PO TABS: ORAL_TABLET | ORAL | Status: DC

## 2011-09-13 NOTE — Patient Instructions (Signed)
Your physician has recommended you make the following change in your medication: DECREASE Metoprolol Tartrate to 50mg  take one-half tablet by mouth twice a day, Continue taking Aspirin  Your physician recommends that you schedule a follow-up appointment in: 3-4 WEEKS with Dr Ladona Ridgel,  Please call Dr Corky Sing office for an appointment to evaluate Chest Pain

## 2011-09-13 NOTE — Progress Notes (Signed)
[address] [place], [place]  [address] Phone: [phone] Fax:  [phone]  Date:  09/13/2011   Name:  Shaun James       DOB:  [birth year] MRN:  [id number]  PCP:  Dr. Kevan Ny Primary Cardiologist:  Dr. Katrinka Blazing Primary Electrophysiologist:  Dr. Lewayne Bunting    History of Present Illness: Shaun James is a 77 y.o. male who presents for post ED visit.  He has a history of CAD, status post CABG in 1998, last LHC 7/09 with a patent SVG-DX, patent SVG-OM, patent SVG-distal RCA, patent LIMA-LAD, severe native vessel disease, EF 65%, hypertension, hyperlipidemia, BPH.  He was admitted several weeks ago with sick sinus syndrome with 6.5 second pauses on home monitoring.  He was symptomatic.  He was seen by Dr. Ladona Ridgel who recommended permanent pacemaker implantation.  He was discharged within 24 hours and his hospital stay was uncomplicated.  He presented to the emergency room on 12/29 and was seen by Dr. Valera Castle.  He noted symptoms of near syncope and rapid palpitations.  EKG in the emergency room demonstrated atrial flutter with occasional ventricular pacing and heart rates between 90 and 120.  Hemoglobin 15.3, potassium 4.3, creatinine 1.0.  Troponin negative.  TSH 6.439.  Chest x-ray unremarkable.  He converted to normal sinus rhythm after receiving IV metoprolol.  He was sent home on metoprolol 50 mg twice a day.  Dr. Daleen Squibb requested early followup in the EP clinic.  He is doing well.  He has chest pressure that he has noted over the last year without change.  He notes a recent stress test with Dr. Katrinka Blazing.  He denies significant dyspnea.  He denies syncope.  He denies any further palpitations since gone to the emergency room.  He denies orthopnea, PND or edema.  Past Medical History  Diagnosis Date  . Hypertension   . Coronary artery disease     s/p CABG  . Shortness of breath   . Myocardial infarction   . Syncope   . Atrial flutter   . SSS (sick sinus syndrome)    s/p pacer 12/12    Current Outpatient Prescriptions  Medication Sig Dispense Refill  . aspirin 325 MG tablet Take 325 mg by mouth daily.        . celecoxib (CELEBREX) 200 MG capsule Take 200 mg by mouth daily as needed. For pain       . Coenzyme Q10 (CO Q 10 PO) Take 1 tablet by mouth daily.        . fish oil-omega-3 fatty acids 1000 MG capsule Take 1 g by mouth daily.        Marland Kitchen GLUCOSAMINE HCL PO Take 2 tablets by mouth daily.       . metoprolol (LOPRESSOR) 100 MG tablet Take 0.5 tablets (50 mg total) by mouth 2 (two) times daily.  60 tablet  0  . nitroGLYCERIN (NITROSTAT) 0.4 MG SL tablet Place 0.4 mg under the tongue every 5 (five) minutes as needed. For chest pain       . ranolazine (RANEXA) 500 MG 12 hr tablet Take 500 mg by mouth 2 (two) times daily.        . simvastatin (ZOCOR) 40 MG tablet Take 20 mg by mouth at bedtime.        . Tamsulosin HCl (FLOMAX) 0.4 MG CAPS Take 0.4 mg by mouth daily.        . valsartan (DIOVAN) 320 MG tablet Take 80  mg by mouth daily.        . vitamin E 100 UNIT capsule Take 100 Units by mouth daily.          Allergies: Allergies  Allergen Reactions  . Zyrtec (Cetirizine Hcl) Other (See Comments)    Unknown   . Penicillins Rash    History  Substance Use Topics  . Smoking status: Never Smoker   . Smokeless tobacco: Never Used  . Alcohol Use: No     ROS:  Please see the history of present illness.   He does note difficulty in sleeping since he started taking metoprolol.  All other systems reviewed and negative.   PHYSICAL EXAM: VS:  BP 129/82  Pulse 59  Resp 18  Ht 5\' 2"  (1.575 m)  Wt 139 lb (63.05 kg)  BMI 25.42 kg/m2 Well nourished, well developed, in no acute distress HEENT: normal Neck: no JVD Cardiac:  normal S1, S2; RRR; no murmur Lungs:  clear to auscultation bilaterally, no wheezing, rhonchi or rales Abd: soft, nontender, no hepatomegaly Ext: no edema Skin: warm and dry Neuro:  CNs 2-12 intact, no focal abnormalities  noted  EKG:   Sinus bradycardia, rate 53, normal axis, nonspecific ST-T wave changes  Device was interrogated today.  He has had no further episodes of atrial flutter since 12/29.  ASSESSMENT AND PLAN:

## 2011-09-13 NOTE — Assessment & Plan Note (Addendum)
He does have a CHADS2-VASc score of 3.  He remains in sinus rhythm.  I reviewed his case with Dr. Johney Frame.  His atrial flutter does look fairly typical and could be applicable.  We have recommended continuing aspirin for now and early followup with Dr. Ladona Ridgel for further recommendations.  If he has recurrent AFlutter, he will likely need coumadin.  He is having some side effects from the metoprolol.  I will decrease this to 25 mg twice a day. I will forward a copy of our note to his primary cardiologist.

## 2011-09-13 NOTE — Assessment & Plan Note (Signed)
He has chest pressure that has been fairly stable for the last year.  I have recommended he followup with his primary cardiologist.

## 2011-09-13 NOTE — Assessment & Plan Note (Signed)
Pacer is functioning appropriately.

## 2011-09-20 ENCOUNTER — Telehealth: Payer: Self-pay | Admitting: Internal Medicine

## 2011-09-20 NOTE — Telephone Encounter (Signed)
Spoke w/connie (pt's wife)---tingling for 1 day. No swelling or redness at site. No fever at site. Pt not having any fever or chills. Explained that site could experience tingling and pain. Device implanted on 08-27-11. Pt and wife understands. Pt to call back with any other problems.

## 2011-09-20 NOTE — Telephone Encounter (Signed)
Pt's wife calling re having some "tingling" arouf the device, is this normal

## 2011-10-01 ENCOUNTER — Telehealth: Payer: Self-pay | Admitting: Internal Medicine

## 2011-10-01 NOTE — Telephone Encounter (Signed)
Confirmed pacer check appt on 10/18/2011

## 2011-10-01 NOTE — Telephone Encounter (Signed)
New problem Pt had eye surgery this morning. He wants to know if he should make appt to see if his pacemaker is doing ok since he was put under anesthesia.

## 2011-10-12 ENCOUNTER — Encounter: Payer: Self-pay | Admitting: Internal Medicine

## 2011-10-18 ENCOUNTER — Encounter: Payer: Self-pay | Admitting: Internal Medicine

## 2011-10-18 ENCOUNTER — Ambulatory Visit (INDEPENDENT_AMBULATORY_CARE_PROVIDER_SITE_OTHER): Payer: Medicare Other | Admitting: Internal Medicine

## 2011-10-18 DIAGNOSIS — Z950 Presence of cardiac pacemaker: Secondary | ICD-10-CM

## 2011-10-18 DIAGNOSIS — E785 Hyperlipidemia, unspecified: Secondary | ICD-10-CM

## 2011-10-18 DIAGNOSIS — I495 Sick sinus syndrome: Secondary | ICD-10-CM

## 2011-10-18 DIAGNOSIS — I2510 Atherosclerotic heart disease of native coronary artery without angina pectoris: Secondary | ICD-10-CM

## 2011-10-18 LAB — PACEMAKER DEVICE OBSERVATION
AL IMPEDENCE PM: 697 Ohm
AL THRESHOLD: 0.8 V
ATRIAL PACING PM: 13
VENTRICULAR PACING PM: 4

## 2011-10-18 NOTE — Patient Instructions (Signed)
Your physician recommends that you schedule a follow-up appointment as needed  

## 2011-10-18 NOTE — Progress Notes (Signed)
HPI Shaun James returns today for followup. 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 This was carried out approximately 3 months ago. Since then his episodes of syncope have resolved. He denies chest pain or shortness of breath. He has very minimal dizziness. He denies frank syncope. He denies nausea or vomiting. Allergies  Allergen Reactions  . Zyrtec (Cetirizine Hcl) Other (See Comments)    Unknown   . Penicillins Rash     Current Outpatient Prescriptions  Medication Sig Dispense Refill  . aspirin 325 MG tablet Take 325 mg by mouth daily.        . celecoxib (CELEBREX) 200 MG capsule Take 200 mg by mouth daily as needed. For pain       . Coenzyme Q10 (CO Q 10 PO) Take 1 tablet by mouth daily.        . fish oil-omega-3 fatty acids 1000 MG capsule Take 1 g by mouth daily.        Marland Kitchen GLUCOSAMINE HCL PO Take 2 tablets by mouth daily.       . metoprolol (LOPRESSOR) 50 MG tablet Take one-half tablet by mouth twice a day  30 tablet  6  . nitroGLYCERIN (NITROSTAT) 0.4 MG SL tablet Place 0.4 mg under the tongue every 5 (five) minutes as needed. For chest pain       . NON FORMULARY Take by mouth 2 (two) times daily. Betaprostates      . ranolazine (RANEXA) 500 MG 12 hr tablet Take 500 mg by mouth 2 (two) times daily.        . simvastatin (ZOCOR) 40 MG tablet Take 20 mg by mouth at bedtime.        . Tamsulosin HCl (FLOMAX) 0.4 MG CAPS Take 0.4 mg by mouth daily.        . valsartan (DIOVAN) 320 MG tablet Take 80 mg by mouth daily.        . vitamin E 100 UNIT capsule Take 100 Units by mouth daily.           Past Medical History  Diagnosis Date  . Hypertension   . Coronary artery disease     s/p CABG  . Shortness of breath   . Myocardial infarction   . Syncope   . Atrial flutter   . SSS (sick sinus syndrome)     s/p pacer 12/12    ROS:   All systems reviewed and negative except as noted in the HPI.   Past Surgical History  Procedure  Date  . Coronary artery bypass graft   . Insert / replace / remove pacemaker 08/27/2011  . Hemorrhoid surgery   . Cataracts     bilateral  . Cardiac catheterization 2002     Family History  Problem Relation Age of Onset  . Coronary artery disease    . Other Father 56    [place]  . Heart attack Mother 22  . COPD Brother   . Hypertension Brother   . Heart disease Brother      History   Social History  . Marital Status: Married    Spouse Name: N/A    Number of Children: N/A  . Years of Education: N/A   Occupational History  . retired     retired Acupuncturist   Social History Main Topics  . Smoking status: Never Smoker   . Smokeless tobacco: Never Used  . Alcohol Use: No  . Drug Use: No  .  Sexually Active: Not Currently   Other Topics Concern  . Not on file   Social History Narrative   Moved from [place] in 1958     BP 132/72  Pulse 56  Ht 5\' 5"  (1.651 m)  Wt 62.197 kg (137 lb 1.9 oz)  BMI 22.82 kg/m2  SpO2 98%  Physical Exam:  Well appearing 78 year old man, NAD HEENT: Unremarkable Neck:  No JVD, no thyromegally Lungs:  Clear with no wheezes, rales, or rhonchi. Well-healed pacemaker incision. HEART:  Regular rate rhythm, no murmurs, no rubs, no clicks Abd:  soft, positive bowel sounds, no organomegally, no rebound, no guarding Ext:  2 plus pulses, no edema, no cyanosis, no clubbing Skin:  No rashes no nodules Neuro:  CN II through XII intact, motor grossly intact  DEVICE  Normal device function.  See PaceArt for details.   Assess/Plan:

## 2011-10-18 NOTE — Assessment & Plan Note (Signed)
His device is working normally. We'll plan to recheck in several months. 

## 2011-10-18 NOTE — Assessment & Plan Note (Signed)
He denies anginal symptoms. He will continue his current medical therapy. 

## 2011-10-18 NOTE — Assessment & Plan Note (Signed)
He will continue his current medical therapy. I've asked that he maintain a low-fat diet and continue his regular daily exercise.

## 2011-12-07 ENCOUNTER — Encounter: Payer: 59 | Admitting: Internal Medicine

## 2011-12-07 ENCOUNTER — Encounter: Payer: Self-pay | Admitting: Internal Medicine

## 2011-12-29 ENCOUNTER — Ambulatory Visit (INDEPENDENT_AMBULATORY_CARE_PROVIDER_SITE_OTHER): Payer: Medicare Other | Admitting: Pulmonary Disease

## 2011-12-29 ENCOUNTER — Encounter: Payer: Self-pay | Admitting: Pulmonary Disease

## 2011-12-29 VITALS — BP 110/62 | HR 64 | Temp 97.0°F | Ht 64.0 in | Wt 139.4 lb

## 2011-12-29 DIAGNOSIS — R0609 Other forms of dyspnea: Secondary | ICD-10-CM

## 2011-12-29 NOTE — Progress Notes (Signed)
  Subjective:    Patient ID: Shaun James, male    DOB: 1931-06-26, 78 y.o.   MRN: [id number]  HPI 78 year old [place] man referred for evaluation of persistent dyspnea. He has a history of CABG in 97, repeat cath in 2009 with patent grafts, 3.1 cm stable AAA and a remote history of tuberculosis. Stress Myoview for 512 showed evidence of mild ischemia in the anteroseptal region but overall low-risk study. The post-rest ejection fraction was 76% without regional wall motion abnormalities. A pacemaker was placed for sick sinus syndrome in 2012. Last pacer check was on 12/15/2010 by Dr. Katrinka Blazing. He was seen on 12/01/2011 for flulike symptoms and purulent phlegm, had already taken amoxicillin, chest x-ray showed right mid lung airspace disease and was given Z-Pak. On a followup visit on 12/06/2011 he was still symptomatic and Levaquin was given for 7 days. Followup chest x-ray and 12/29/2011 is reported as clearing of right upper lobe infiltrate. Calcification has been noted on the right hemidiaphragm on his prior film in December of 2012. He could walk 30 mins on the treadmill 3 times/ wk prior to the pneumonia & now he can barely go for 10 mins . Spirometry showed noticed airway obstruction FEV1 was 93% FVC was 98% and ratio was 72 . He smoked 15 pack years before quitting in 1967. He did not desaturate on ambulation today. Of note he was evaluated for similar symptoms by Dr. Sherene Sires in 2009   Review of Systems  Constitutional: Positive for appetite change and unexpected weight change. Negative for fever.  HENT: Negative for ear pain, congestion, sore throat, trouble swallowing and dental problem.   Respiratory: Positive for shortness of breath. Negative for cough.   Cardiovascular: Negative for chest pain, palpitations and leg swelling.  Gastrointestinal: Negative for abdominal pain.  Musculoskeletal: Positive for arthralgias.  Skin: Negative for rash.  Neurological: Positive for headaches.    Psychiatric/Behavioral: Negative for dysphoric mood. The patient is not nervous/anxious.        Objective:   Physical Exam  Gen. Pleasant, well-nourished,Asian male in no distress, normal affect ENT - no lesions, no post nasal drip Neck: No JVD, no thyromegaly, no carotid bruits Lungs: no use of accessory muscles, no dullness to percussion, clear without rales or rhonchi  Cardiovascular: Rhythm regular, heart sounds  normal, no murmurs or gallops, no peripheral edema Abdomen: soft and non-tender, no hepatosplenomegaly, BS normal. Musculoskeletal: No deformities, no cyanosis or clubbing Neuro:  alert, non focal       Assessment & Plan:

## 2011-12-29 NOTE — Patient Instructions (Signed)
Breathing test does not show asthma or COPD Your oxygen level did not drop with walking I will review your xrays from Dr Kevan Ny Do not have pulmonary cause of shortness of breath

## 2011-12-29 NOTE — Assessment & Plan Note (Signed)
No obvious pulmonary cause for his dyspnea. There is no airway obstruction on spirometry which rules out COPD or asthma. Is no evidence of interstitial lung disease on imaging studies. Pneumonia has resolved. There is no clinical risk factors for pulmonary embolism. I would recommend a period of observation and if his symptoms persist, consider repeat cardiac evaluation with a treadmill stress test since this seems to be his major complaint- dyspnea after exercising for 20 minutes. We can reevaluate him if the need arises.

## 2012-04-29 ENCOUNTER — Ambulatory Visit (INDEPENDENT_AMBULATORY_CARE_PROVIDER_SITE_OTHER): Payer: Medicare Other | Admitting: Emergency Medicine

## 2012-04-29 VITALS — BP 126/69 | HR 70 | Temp 97.6°F | Resp 18 | Ht 65.0 in | Wt 138.0 lb

## 2012-04-29 DIAGNOSIS — S5010XA Contusion of unspecified forearm, initial encounter: Secondary | ICD-10-CM

## 2012-04-29 DIAGNOSIS — S5012XA Contusion of left forearm, initial encounter: Secondary | ICD-10-CM

## 2012-04-29 NOTE — Progress Notes (Signed)
  Subjective:    Patient ID: Shaun James, male    DOB: [birth year], 78 y.o.   MRN: [id number]  HPI Comments: Enlarging contusion forearm.  No history of injury.  Tender to touch but no pain.  Arm Injury  The incident occurred 3 to 5 days ago. There was no injury mechanism. The pain is present in the left forearm. The pain does not radiate. The pain is at a severity of 1/10. The pain has been improving since the incident. Pertinent negatives include no chest pain, muscle weakness, numbness or tingling. Nothing aggravates the symptoms. He has tried nothing for the symptoms.      Review of Systems  Constitutional: Negative.   Cardiovascular: Negative.  Negative for chest pain.  Gastrointestinal: Negative.   Musculoskeletal: Negative.   Skin: Positive for color change. Negative for pallor, rash and wound.  Neurological: Negative.  Negative for tingling and numbness.  Hematological: Negative.        Objective:   Physical Exam  Constitutional: He appears well-developed and well-nourished.  HENT:  Head: Normocephalic and atraumatic.  Eyes: Pupils are equal, round, and reactive to light. No scleral icterus.  Neck: Normal range of motion. Neck supple.  Cardiovascular: Normal rate and regular rhythm.   Pulmonary/Chest: Effort normal.  Abdominal: Soft.  Musculoskeletal: Normal range of motion.  Skin: Skin is warm and dry. There is erythema (ecchymosis forearm.  no cellulitis).          Assessment & Plan:  Contusion forearm no history of injury Local heat Follow up as needed

## 2012-06-14 ENCOUNTER — Encounter: Payer: Self-pay | Admitting: Internal Medicine

## 2012-06-14 ENCOUNTER — Other Ambulatory Visit: Payer: Medicare Other

## 2012-06-14 ENCOUNTER — Other Ambulatory Visit (INDEPENDENT_AMBULATORY_CARE_PROVIDER_SITE_OTHER): Payer: Medicare Other

## 2012-06-14 ENCOUNTER — Ambulatory Visit (INDEPENDENT_AMBULATORY_CARE_PROVIDER_SITE_OTHER): Payer: Medicare Other | Admitting: Internal Medicine

## 2012-06-14 VITALS — BP 130/84 | HR 59 | Temp 97.6°F | Ht 65.0 in | Wt 138.0 lb

## 2012-06-14 DIAGNOSIS — Z0000 Encounter for general adult medical examination without abnormal findings: Secondary | ICD-10-CM

## 2012-06-14 DIAGNOSIS — R5383 Other fatigue: Secondary | ICD-10-CM

## 2012-06-14 DIAGNOSIS — M1990 Unspecified osteoarthritis, unspecified site: Secondary | ICD-10-CM | POA: Insufficient documentation

## 2012-06-14 DIAGNOSIS — Z23 Encounter for immunization: Secondary | ICD-10-CM

## 2012-06-14 DIAGNOSIS — I2510 Atherosclerotic heart disease of native coronary artery without angina pectoris: Secondary | ICD-10-CM

## 2012-06-14 DIAGNOSIS — R5381 Other malaise: Secondary | ICD-10-CM

## 2012-06-14 DIAGNOSIS — I495 Sick sinus syndrome: Secondary | ICD-10-CM

## 2012-06-14 HISTORY — DX: Unspecified osteoarthritis, unspecified site: M19.90

## 2012-06-14 LAB — URINALYSIS, ROUTINE W REFLEX MICROSCOPIC
Specific Gravity, Urine: 1.02 (ref 1.000–1.030)
Total Protein, Urine: NEGATIVE
Urine Glucose: NEGATIVE

## 2012-06-14 LAB — CBC WITH DIFFERENTIAL/PLATELET
Basophils Relative: 0.7 % (ref 0.0–3.0)
Hemoglobin: 14.5 g/dL (ref 13.0–17.0)
Lymphocytes Relative: 25.7 % (ref 12.0–46.0)
MCHC: 33.2 g/dL (ref 30.0–36.0)
Monocytes Relative: 9.2 % (ref 3.0–12.0)
Neutro Abs: 3.9 10*3/uL (ref 1.4–7.7)
RBC: 4.64 Mil/uL (ref 4.22–5.81)

## 2012-06-14 LAB — LIPID PANEL
HDL: 42.9 mg/dL (ref >39.00)
LDL Cholesterol: 66 mg/dL (ref 0–99)
Total CHOL/HDL Ratio: 3
Triglycerides: 199 mg/dL — ABNORMAL HIGH (ref 0.0–149.0)
VLDL: 39.8 mg/dL (ref 0.0–40.0)

## 2012-06-14 LAB — HEPATIC FUNCTION PANEL
AST: 24 U/L (ref 0–37)
Albumin: 4.2 g/dL (ref 3.5–5.2)

## 2012-06-14 LAB — BASIC METABOLIC PANEL
CO2: 28 mEq/L (ref 19–32)
Calcium: 9 mg/dL (ref 8.4–10.5)
GFR: 89.93 mL/min (ref >60.00)
Potassium: 4.4 mEq/L (ref 3.5–5.1)
Sodium: 138 mEq/L (ref 135–145)

## 2012-06-14 NOTE — Progress Notes (Signed)
Subjective:    Patient ID: Shaun James, male    DOB: Mar 21, 1931, 78 y.o.   MRN: [id number]  HPI New patient to me and our division, here today to establish care Known to our cardiology and pulmonary divisions -transfer from [facility] Dr. Kevan Ny as prior PCP  patient is here today for annual physical/Medicare wellness exam. Patient feels well overall.  Diet: heart healthy  Physical activity: Moderately active - 9 holes of golf 2-3d/week Depression/mood screen: negative Hearing: intact and quiet room to voice with B aides contact Visual acuity: grossly normal, performs annual eye exam  ADLs: capable Fall risk: none Home safety: good Cognitive evaluation: intact to orientation, naming, recall and repetition EOL planning: adv directives, full code/ I agree  I have personally reviewed the Medicare Annual Wellness questionnaire and have noted 1. The patient's medical and social history 2. Their use of alcohol, tobacco or illicit drugs 3. Their current medications and supplements 4. The patient's functional ability including ADL's, fall risks, home safety risks and hearing or visual impairment. 5. Diet and physical activities 6. Evidence for depression or mood disorders   Reviewed chronic medical issues today: Coronary artery disease. History of MI status post CABG in 1997. Last stress Myoview may 2012 without ischemia. Follows regularly with cardiology. Reviewed med management as ongoing, reports compliance as prescribed  Syncope with history sick sinus syndrome. Status post permanent pacemaker for same December 2012 -no recurrent dizziness or syncope events  osteoarthritis - B hands, hips and hx L shoulder RTC problems - denies recent injury or joint swelling. Uses Celebrex as needed with good relief  Past Medical History  Diagnosis Date  . Hypertension   . Coronary artery disease     s/p CABG '98  . Myocardial infarction   . Atrial flutter   . SSS (sick sinus syndrome)     syncope,  s/p ppm 12/12  . Arthritis   . Allergic rhinitis    Family History  Problem Relation Age of Onset  . Coronary artery disease    . Other Father 16    [place]  . Heart attack Mother 65  . COPD Brother   . Hypertension Brother   . Heart disease Brother    History  Substance Use Topics  . Smoking status: Former Smoker -- 0.5 packs/day for 10 years    Types: Cigarettes    Quit date: 09/13/1965  . Smokeless tobacco: Never Used  . Alcohol Use: No     Review of Systems Constitutional: Negative for fever or weight change.  complains of fatigue Respiratory: Negative for cough and shortness of breath.   Cardiovascular: Negative for chest pain or palpitations.  Gastrointestinal: Negative for abdominal pain, no bowel changes.  Musculoskeletal: Negative for gait problem or joint swelling.  Skin: Negative for rash.  Neurological: Negative for dizziness or headache.  No other specific complaints in a complete review of systems (except as listed in HPI above).     Objective:   Physical Exam BP 130/84  Pulse 59  Temp 97.6 F (36.4 C) (Oral)  Ht 5\' 5"  (1.651 m)  Wt 138 lb (62.596 kg)  BMI 22.96 kg/m2  SpO2 97% Wt Readings from Last 3 Encounters:  06/14/12 138 lb (62.596 kg)  04/29/12 138 lb (62.596 kg)  12/29/11 139 lb 6.4 oz (63.231 kg)   Constitutional:  He appears well-developed and well-nourished. No distress.  Neck: Normal range of motion. Neck supple. No JVD present. No thyromegaly present.  Cardiovascular: Normal rate,  regular rhythm and normal heart sounds.  No murmur heard. no BLE edema Pulmonary/Chest: Effort normal and breath sounds normal. No respiratory distress. no wheezes.  Abdominal: Soft. Bowel sounds are normal. Patient exhibits no distension. There is no tenderness.  Musculoskeletal: Normal range of motion. Patient exhibits no edema.  Neurological: he is alert and oriented to person, place, and time. No cranial nerve deficit. Coordination normal.  Skin:  Skin is warm and dry.  No erythema or ulceration.  Psychiatric: he has a normal mood and affect. behavior is normal. Judgment and thought content normal.   Lab Results  Component Value Date   WBC 6.6 09/11/2011   HGB 15.3 09/11/2011   HCT 45.0 09/11/2011   PLT 227 09/11/2011   GLUCOSE 88 09/11/2011   ALT 20 08/27/2011   AST 19 08/27/2011   NA 140 09/11/2011   K 4.3 09/11/2011   CL 105 09/11/2011   CREATININE 1.00 09/11/2011   BUN 10 09/11/2011   CO2 28 08/27/2011   TSH 6.439* 08/27/2011   INR 1.19 08/27/2011       Assessment & Plan:  CPX/v70.0 - Today patient counseled on age appropriate routine health concerns for screening and prevention, each reviewed and up to date or declined. Immunizations reviewed and up to date or declined. Labs/ECG reviewed. Risk factors for depression reviewed and negative. Hearing function and visual acuity are intact. ADLs screened and addressed as needed. Functional ability and level of safety reviewed and appropriate. Education, counseling and referrals performed based on assessed risks today. Patient provided with a copy of personalized plan for preventive services.  Fatigue - nonspecific symptoms/exam - check screening labs  Also See problem list. Medications and labs reviewed today.

## 2012-06-14 NOTE — Patient Instructions (Signed)
It was good to see you today. We have reviewed your prior records including labs and tests today Health Maintenance reviewed - flu shot and pneumonia shot given today - all other recommended immunizations and age-appropriate screenings appear up-to-date. we will send to your prior provider(s) for "release of records" as discussed today -  Test(s) ordered today. Your results will be released to MyChart (or called to you) after review, usually within 72hours after test completion. If any changes need to be made, you will be notified at that same time. Medications reviewed and updated, no changes at this time. Please schedule followup in 6 months, call sooner if problems.  Health Maintenance, Males A healthy lifestyle and preventative care can promote health and wellness.  Maintain regular health, dental, and eye exams.   Eat a healthy diet. Foods like vegetables, fruits, whole grains, low-fat dairy products, and lean protein foods contain the nutrients you need without too many calories. Decrease your intake of foods high in solid fats, added sugars, and salt. Get information about a proper diet from your caregiver, if necessary.   Regular physical exercise is one of the most important things you can do for your health. Most adults should get at least 150 minutes of moderate-intensity exercise (any activity that increases your heart rate and causes you to sweat) each week. In addition, most adults need muscle-strengthening exercises on 2 or more days a week.     Maintain a healthy weight. The body mass index (BMI) is a screening tool to identify possible weight problems. It provides an estimate of body fat based on height and weight. Your caregiver can help determine your BMI, and can help you achieve or maintain a healthy weight. For adults 20 years and older:   A BMI below 18.5 is considered underweight.   A BMI of 18.5 to 24.9 is normal.   A BMI of 25 to 29.9 is considered overweight.   A  BMI of 30 and above is considered obese.   Maintain normal blood lipids and cholesterol by exercising and minimizing your intake of saturated fat. Eat a balanced diet with plenty of fruits and vegetables. Blood tests for lipids and cholesterol should begin at age 34 and be repeated every 5 years. If your lipid or cholesterol levels are high, you are over 50, or you are a high risk for heart disease, you may need your cholesterol levels checked more frequently. Ongoing high lipid and cholesterol levels should be treated with medicines, if diet and exercise are not effective.   If you smoke, find out from your caregiver how to quit. If you do not use tobacco, do not start.   If you choose to drink alcohol, do not exceed 2 drinks per day. One drink is considered to be 12 ounces (355 mL) of beer, 5 ounces (148 mL) of wine, or 1.5 ounces (44 mL) of liquor.   Avoid use of street drugs. Do not share needles with anyone. Ask for help if you need support or instructions about stopping the use of drugs.   High blood pressure causes heart disease and increases the risk of stroke. Blood pressure should be checked at least every 1 to 2 years. Ongoing high blood pressure should be treated with medicines if weight loss and exercise are not effective.   If you are 47 to 76 years old, ask your caregiver if you should take aspirin to prevent heart disease.   Diabetes screening involves taking a blood sample to check  your fasting blood sugar level. This should be done once every 3 years, after age 26, if you are within normal weight and without risk factors for diabetes. Testing should be considered at a younger age or be carried out more frequently if you are overweight and have at least 1 risk factor for diabetes.   Colorectal cancer can be detected and often prevented. Most routine colorectal cancer screening begins at the age of 50 and continues through age 70. However, your caregiver may recommend screening at an  earlier age if you have risk factors for colon cancer. On a yearly basis, your caregiver may provide home test kits to check for hidden blood in the stool. Use of a small camera at the end of a tube, to directly examine the colon (sigmoidoscopy or colonoscopy), can detect the earliest forms of colorectal cancer. Talk to your caregiver about this at age 40, when routine screening begins. Direct examination of the colon should be repeated every 5 to 10 years through age 35, unless early forms of pre-cancerous polyps or small growths are found.   Hepatitis C blood testing is recommended for all people born from [birth year] through 1965 and any individual with known risks for hepatitis C.   Healthy men should no longer receive prostate-specific antigen (PSA) blood tests as part of routine cancer screening. Consult with your caregiver about prostate cancer screening.   Testicular cancer screening is not recommended for adolescents or adult males who have no symptoms. Screening includes self-exam, caregiver exam, and other screening tests. Consult with your caregiver about any symptoms you have or any concerns you have about testicular cancer.   Practice safe sex. Use condoms and avoid high-risk sexual practices to reduce the spread of sexually transmitted infections (STIs).   Use sunscreen with a sun protection factor (SPF) of 30 or greater. Apply sunscreen liberally and repeatedly throughout the day. You should seek shade when your shadow is shorter than you. Protect yourself by wearing long sleeves, pants, a wide-brimmed hat, and sunglasses year round, whenever you are outdoors.   Notify your caregiver of new moles or changes in moles, especially if there is a change in shape or color. Also notify your caregiver if a mole is larger than the size of a pencil eraser.   A one-time screening for abdominal aortic aneurysm (AAA) and surgical repair of large AAAs by sound wave imaging (ultrasonography) is recommended  for ages 7 to 75 years who are current or former smokers.   Stay current with your immunizations.  Document Released: 02/26/2008 Document Revised: 11/22/2011 Document Reviewed: 01/25/2011 [facility] Patient Information 2013 [place], [place].

## 2012-06-15 ENCOUNTER — Telehealth: Payer: Self-pay | Admitting: Internal Medicine

## 2012-06-15 NOTE — Telephone Encounter (Signed)
Noted - please call to check on pt Fri 10/4 - if still having fever, please schedule OV to eval -  Otherwise, if improving symptoms, continue tylenol and ice or heat to swelling as needed - thanks

## 2012-06-15 NOTE — Telephone Encounter (Signed)
Caller: [name]/Spouse; Patient Name: Shaun James; PCP: Rene Paci (Adults only); Best Callback Phone Number: [phone] Onset 06/14/12 received Pneumonia shot in right arm and developed redness with fever and headache.  06/15/12 Has no fever or headache today however softball sized redness still present with tenderness. All emergent symptoms per Abrasions, lacerations, puncture wounds protocol ruled out with exception of "Localized soreness/tenderness, swelling or discoloration of skin at injections site"  Home care advice given.

## 2012-06-16 NOTE — Telephone Encounter (Signed)
Pt states that he does not have any fever today, he still has swelling and soreness at his arm but it is better than yesterday. Pt informed of MD's advisement and states that he will make an appointment if he feel he needs to.

## 2012-11-21 ENCOUNTER — Other Ambulatory Visit (INDEPENDENT_AMBULATORY_CARE_PROVIDER_SITE_OTHER): Payer: Medicare Other

## 2012-11-21 ENCOUNTER — Encounter: Payer: Self-pay | Admitting: Internal Medicine

## 2012-11-21 ENCOUNTER — Ambulatory Visit (INDEPENDENT_AMBULATORY_CARE_PROVIDER_SITE_OTHER): Payer: Medicare Other | Admitting: Internal Medicine

## 2012-11-21 VITALS — BP 132/70 | HR 61 | Temp 97.0°F | Wt 137.8 lb

## 2012-11-21 LAB — HEPATIC FUNCTION PANEL
ALT: 12 U/L (ref 0–53)
AST: 19 U/L (ref 0–37)
Albumin: 3.9 g/dL (ref 3.5–5.2)
Alkaline Phosphatase: 49 U/L (ref 39–117)

## 2012-11-21 LAB — CBC WITH DIFFERENTIAL/PLATELET
Basophils Absolute: 0 10*3/uL (ref 0.0–0.1)
Basophils Relative: 0.4 % (ref 0.0–3.0)
Eosinophils Absolute: 0.1 10*3/uL (ref 0.0–0.7)
Hemoglobin: 13.6 g/dL (ref 13.0–17.0)
Lymphocytes Relative: 23.2 % (ref 12.0–46.0)
MCHC: 33.4 g/dL (ref 30.0–36.0)
Monocytes Relative: 8.7 % (ref 3.0–12.0)
Neutro Abs: 3.9 10*3/uL (ref 1.4–7.7)
Neutrophils Relative %: 65.2 % (ref 43.0–77.0)
RBC: 4.43 Mil/uL (ref 4.22–5.81)
RDW: 13.3 % (ref 11.5–14.6)

## 2012-11-21 LAB — BASIC METABOLIC PANEL
CO2: 29 mEq/L (ref 19–32)
Calcium: 9 mg/dL (ref 8.4–10.5)
Creatinine, Ser: 1 mg/dL (ref 0.4–1.5)
GFR: 77.38 mL/min (ref >60.00)
Sodium: 137 mEq/L (ref 135–145)

## 2012-11-21 NOTE — Progress Notes (Signed)
  Subjective:    Patient ID: Shaun James, male    DOB: 06/05/1931, 79 y.o.   MRN: [id number]  HPI  complains of overwhelming and progressive fatigue Ongoing >2 years  also mild diffuse generalized headache improved symptoms with tylenol, but does not wish to take this med daily  Also reviewed chronic medical issues today: Coronary artery disease. History of MI status post CABG in 1997. Last stress Myoview may 2012 without ischemia. Follows regularly with cardiology. Reviewed med management as ongoing, reports compliance as prescribed  Syncope with history sick sinus syndrome. Status post permanent pacemaker for same December 2012 -no recurrent dizziness or syncope events  osteoarthritis - B hands, hips and hx L shoulder RTC problems - denies recent injury or joint swelling. Uses Celebrex as needed with good relief  Past Medical History  Diagnosis Date  . Hypertension   . Coronary artery disease     s/p CABG '98  . Myocardial infarction   . Atrial flutter   . SSS (sick sinus syndrome)     syncope, s/p ppm 12/12  . Arthritis   . Allergic rhinitis   . Osteoarthritis 06/14/2012    Review of Systems Constitutional: Negative for fever or weight change.  complains of fatigue Respiratory: Negative for cough and shortness of breath.   Cardiovascular: Negative for chest pain or palpitations.      Objective:   Physical Exam  BP 132/70  Pulse 61  Temp(Src) 97 F (36.1 C) (Oral)  Wt 137 lb 12.8 oz (62.506 kg)  BMI 22.93 kg/m2  SpO2 96% Wt Readings from Last 3 Encounters:  11/21/12 137 lb 12.8 oz (62.506 kg)  06/14/12 138 lb (62.596 kg)  04/29/12 138 lb (62.596 kg)   Constitutional:  He appears well-developed and well-nourished. No distress. Very hard of hearing, bilateral hearing aids in place Neck: Normal range of motion. Neck supple. No JVD present. No thyromegaly present.  Cardiovascular: Normal rate, regular rhythm and normal heart sounds.  No murmur heard. no BLE  edema Pulmonary/Chest: Effort normal and breath sounds normal. No respiratory distress. no wheezes.  Musculoskeletal: Normal range of motion. Patient exhibits no gross deformity.  Neurological: he is alert and oriented to person, place, and time. No cranial nerve deficit. Coordination, Balance, speech and recall are normal.  Skin: Skin is warm and dry.  No erythema or ulceration.  Psychiatric: he has a normal mood and affect. behavior is normal. Judgment and thought content normal.   Lab Results  Component Value Date   WBC 6.3 06/14/2012   HGB 14.5 06/14/2012   HCT 43.6 06/14/2012   PLT 211.0 06/14/2012   GLUCOSE 111* 06/14/2012   CHOL 149 06/14/2012   TRIG 199.0* 06/14/2012   HDL 42.90 06/14/2012   LDLCALC 66 06/14/2012   ALT 18 06/14/2012   AST 24 06/14/2012   NA 138 06/14/2012   K 4.4 06/14/2012   CL 104 06/14/2012   CREATININE 0.9 06/14/2012   BUN 9 06/14/2012   CO2 28 06/14/2012   TSH 2.89 06/14/2012   INR 1.19 08/27/2011       Assessment & Plan:   Fatigue - nonspecific symptoms/exam - check screening labs - verified no depression symptoms   Chronic daily headache, exam nonfocal. History and exam benign. Improves with Tylenol, possible med side effect. Check head CT given chronicity -reassurance provided  Also See problem list. Medications and labs reviewed today.

## 2012-11-21 NOTE — Patient Instructions (Signed)
It was good to see you today. We have reviewed your prior records including labs and tests today Test(s) ordered today. Your results will be released to MyChart (or called to you) after review, usually within 72hours after test completion. If any changes need to be made, you will be notified at that same time. we'll make referral for head ct scan to look into daily headache symptoms. Our office will contact you regarding appointment(s) once made. Please schedule followup in 6 months, call sooner if problems. Fatigue Fatigue is a feeling of tiredness, lack of energy, lack of motivation, or feeling tired all the time. Having enough rest, good nutrition, and reducing stress will normally reduce fatigue. Consult your caregiver if it persists. The nature of your fatigue will help your caregiver to find out its cause. The treatment is based on the cause.  CAUSES  There are many causes for fatigue. Most of the time, fatigue can be traced to one or more of your habits or routines. Most causes fit into one or more of three general areas. They are: Lifestyle problems  Sleep disturbances.  Overwork.  Physical exertion.  Unhealthy habits.  Poor eating habits or eating disorders.  Alcohol and/or drug use .  Lack of proper nutrition (malnutrition). Psychological problems  Stress and/or anxiety problems.  Depression.  Grief.  Boredom. Medical Problems or Conditions  Anemia.  Pregnancy.  Thyroid gland problems.  Recovery from major surgery.  Continuous pain.  Emphysema or asthma that is not well controlled  Allergic conditions.  Diabetes.  Infections (such as mononucleosis).  Obesity.  Sleep disorders, such as sleep apnea.  Heart failure or other heart-related problems.  Cancer.  Kidney disease.  Liver disease.  Effects of certain medicines such as antihistamines, cough and cold remedies, prescription pain medicines, heart and blood pressure medicines, drugs used for  treatment of cancer, and some antidepressants. SYMPTOMS  The symptoms of fatigue include:   Lack of energy.  Lack of drive (motivation).  Drowsiness.  Feeling of indifference to the surroundings. DIAGNOSIS  The details of how you feel help guide your caregiver in finding out what is causing the fatigue. You will be asked about your present and past health condition. It is important to review all medicines that you take, including prescription and non-prescription items. A thorough exam will be done. You will be questioned about your feelings, habits, and normal lifestyle. Your caregiver may suggest blood tests, urine tests, or other tests to look for common medical causes of fatigue.  TREATMENT  Fatigue is treated by correcting the underlying cause. For example, if you have continuous pain or depression, treating these causes will improve how you feel. Similarly, adjusting the dose of certain medicines will help in reducing fatigue.  HOME CARE INSTRUCTIONS   Try to get the required amount of good sleep every night.  Eat a healthy and nutritious diet, and drink enough water throughout the day.  Practice ways of relaxing (including yoga or meditation).  Exercise regularly.  Make plans to change situations that cause stress. Act on those plans so that stresses decrease over time. Keep your work and personal routine reasonable.  Avoid street drugs and minimize use of alcohol.  Start taking a daily multivitamin after consulting your caregiver. SEEK MEDICAL CARE IF:   You have persistent tiredness, which cannot be accounted for.  You have fever.  You have unintentional weight loss.  You have headaches.  You have disturbed sleep throughout the night.  You are feeling sad.  You have constipation.  You have dry skin.  You have gained weight.  You are taking any new or different medicines that you suspect are causing fatigue.  You are unable to sleep at night.  You develop  any unusual swelling of your legs or other parts of your body. SEEK IMMEDIATE MEDICAL CARE IF:   You are feeling confused.  Your vision is blurred.  You feel faint or pass out.  You develop severe headache.  You develop severe abdominal, pelvic, or back pain.  You develop chest pain, shortness of breath, or an irregular or fast heartbeat.  You are unable to pass a normal amount of urine.  You develop abnormal bleeding such as bleeding from the rectum or you vomit blood.  You have thoughts about harming yourself or committing suicide.  You are worried that you might harm someone else. MAKE SURE YOU:   Understand these instructions.  Will watch your condition.  Will get help right away if you are not doing well or get worse. Document Released: 06/27/2007 Document Revised: 11/22/2011 Document Reviewed: 06/27/2007 [facility] Patient Information 2013 [place], [place]. General Headache Without Cause A headache is pain or discomfort felt around the head or neck area. The specific cause of a headache may not be found. There are many causes and types of headaches. A few common ones are:  Tension headaches.  Migraine headaches.  Cluster headaches.  Chronic daily headaches. HOME CARE INSTRUCTIONS   Keep all follow-up appointments with your caregiver or any specialist referral.  Only take over-the-counter or prescription medicines for pain or discomfort as directed by your caregiver.  Lie down in a dark, quiet room when you have a headache.  Keep a headache journal to find out what may trigger your migraine headaches. For example, write down:  What you eat and drink.  How much sleep you get.  Any change to your diet or medicines.  Try massage or other relaxation techniques.  Put ice packs or heat on the head and neck. Use these 3 to 4 times per day for 15 to 20 minutes each time, or as needed.  Limit stress.  Sit up straight, and do not tense your muscles.  Quit  smoking if you smoke.  Limit alcohol use.  Decrease the amount of caffeine you drink, or stop drinking caffeine.  Eat and sleep on a regular schedule.  Get 7 to 9 hours of sleep, or as recommended by your caregiver.  Keep lights dim if bright lights bother you and make your headaches worse. SEEK MEDICAL CARE IF:   You have problems with the medicines you were prescribed.  Your medicines are not working.  You have a change from the usual headache.  You have nausea or vomiting. SEEK IMMEDIATE MEDICAL CARE IF:   Your headache becomes severe.  You have a fever.  You have a stiff neck.  You have loss of vision.  You have muscular weakness or loss of muscle control.  You start losing your balance or have trouble walking.  You feel faint or pass out.  You have severe symptoms that are different from your first symptoms. MAKE SURE YOU:   Understand these instructions.  Will watch your condition.  Will get help right away if you are not doing well or get worse. Document Released: 08/30/2005 Document Revised: 11/22/2011 Document Reviewed: 09/15/2011 [facility] Patient Information 2013 [place], [place].

## 2012-11-21 NOTE — Assessment & Plan Note (Signed)
No anginal symptoms - reviewed med hx Recent stress test fall 2013 WNL - The current medical regimen is effective;  continue present plan and medications.

## 2012-11-24 ENCOUNTER — Ambulatory Visit (INDEPENDENT_AMBULATORY_CARE_PROVIDER_SITE_OTHER)
Admission: RE | Admit: 2012-11-24 | Discharge: 2012-11-24 | Disposition: A | Discharge status: Home / Self Care | Payer: Medicare Other | Source: Ambulatory Visit | Attending: Internal Medicine | Admitting: Internal Medicine

## 2012-11-24 DIAGNOSIS — R51 Headache: Secondary | ICD-10-CM

## 2012-12-06 ENCOUNTER — Ambulatory Visit (INDEPENDENT_AMBULATORY_CARE_PROVIDER_SITE_OTHER): Payer: Medicare Other | Admitting: Internal Medicine

## 2012-12-06 ENCOUNTER — Encounter: Payer: Self-pay | Admitting: Internal Medicine

## 2012-12-06 ENCOUNTER — Ambulatory Visit (INDEPENDENT_AMBULATORY_CARE_PROVIDER_SITE_OTHER)
Admission: RE | Admit: 2012-12-06 | Discharge: 2012-12-06 | Disposition: A | Discharge status: Home / Self Care | Payer: Medicare Other | Source: Ambulatory Visit | Attending: Internal Medicine | Admitting: Internal Medicine

## 2012-12-06 VITALS — BP 118/68 | HR 62 | Temp 97.0°F | Wt 139.0 lb

## 2012-12-06 DIAGNOSIS — R0989 Other specified symptoms and signs involving the circulatory and respiratory systems: Secondary | ICD-10-CM

## 2012-12-06 DIAGNOSIS — R51 Headache: Secondary | ICD-10-CM

## 2012-12-06 DIAGNOSIS — I2510 Atherosclerotic heart disease of native coronary artery without angina pectoris: Secondary | ICD-10-CM

## 2012-12-06 DIAGNOSIS — R0609 Other forms of dyspnea: Secondary | ICD-10-CM

## 2012-12-06 NOTE — Assessment & Plan Note (Signed)
No overt anginal symptoms - reviewed med hx Follows with [facility] for same Shaun James) Recent stress test fall 2013 WNL - The current medical regimen is effective;  continue present plan and medications.

## 2012-12-06 NOTE — Patient Instructions (Addendum)
It was good to see you today. We have reviewed your prior records including labs and tests today Test(s) ordered today. Your results will be released to MyChart (or called to you) after review, usually within 72hours after test completion. If any changes need to be made, you will be notified at that same time. we'll make referral for pulmonary function test to evaluate lung function. Our office will contact you regarding appointment(s) once made. Medications reviewed, no changes recommended at this time. Please schedule followup in 6 months, call sooner if problems. Dyspnea Shortness of breath (dyspnea) is the feeling of uneasy breathing. Dyspnea should be evaluated promptly. DIAGNOSIS  Many tests may be done to find why you are having shortness of breath. Tests may include:  A chest X-ray.   A lung function test.   Blood tests.   Recordings of the electrical activity of the heart (electrocardiogram).   Exercise testing.   Sound wave images of the heart (a cardiac echocardiogram).   A scan.  A cause for your shortness of breath may not be identified initially. In this case, it is important to have a follow-up exam with your caregiver. HOME CARE INSTRUCTIONS   Do not smoke. Smoking is a common cause of shortness of breath. Ask for help to stop smoking.   Avoid being around chemicals that may bother your breathing, such as paint fumes or dust.   Rest as needed. Slowly begin your usual activities.   If medications were prescribed, take them as directed for the full length of time directed. This includes oxygen and any inhaled medications, if prescribed.   It is very important that you follow up with your caregiver or other physician as directed. Waiting to do so or failure to follow up could result in worsening of your condition, possible disability, or death.   Be sure you understand what to do or who to call if your shortness of breath worsens.  SEEK MEDICAL CARE IF:   Your  condition does not improve in the time expected.   You have a hard time doing your normal activities even with rest.   You have any side effects from or problems with medications prescribed.  SEEK IMMEDIATE MEDICAL CARE IF:   You feel your shortness of breath is getting worse.   You feel lightheaded, faint or develop a cough not controlled with medications.   You start coughing up blood.   You get pain with breathing.   You get chest pain or pain in your arms, shoulders or belly (abdomen).   You have a fever.   You are unable to walk up stairs or exercise the way you normally can.  MAKE SURE YOU:   Understand these instructions.   Will watch your condition.   Will get help right away if you are not doing well or get worse.  Document Released: 10/07/2004 Document Revised: 05/12/2011 Document Reviewed: 01/15/2010 [facility] Patient Information 2012 [place], [place].

## 2012-12-06 NOTE — Progress Notes (Signed)
Subjective:    Patient ID: Shaun James, male    DOB: September 25, 1930, 79 y.o.   MRN: [id number]  HPI  Here for follow up - reviewed chronic medical issues:  chronic overwhelming and progressive fatigue Ongoing >2 years associated with dyspnea on exertion - prior pulm eval 12/2011 reviewed - normal CXR and spirometry  Headache, daily/chronic - mild diffuse and generalized improved symptoms with tylenol, but does not wish to take this med daily  Coronary artery disease. History of MI status post CABG in 1997. Last stress Myoview may 2012 without ischemia. Follows regularly with cardiology. Reviewed med management as ongoing, reports compliance as prescribed  Syncope with history sick sinus syndrome. Status post permanent pacemaker for same December 2012 -no recurrent dizziness or syncope events  osteoarthritis - B hands, hips and hx L shoulder RTC problems - denies recent injury or joint swelling. Uses Celebrex as needed with good relief  Past Medical History  Diagnosis Date  . Hypertension   . Coronary artery disease     s/p CABG '98  . Myocardial infarction   . Atrial flutter   . SSS (sick sinus syndrome)     syncope, s/p ppm 12/12  . Arthritis   . Allergic rhinitis   . Osteoarthritis 06/14/2012    Review of Systems  Constitutional: Negative for fever or weight change.  complains of fatigue Respiratory: Negative for cough or wheeze - chronic shortness of breath.   Cardiovascular: Negative for chest pain or palpitations.      Objective:   Physical Exam  BP 118/68  Pulse 62  Temp(Src) 97 F (36.1 C) (Oral)  Wt 139 lb (63.05 kg)  BMI 23.13 kg/m2  SpO2 98% Wt Readings from Last 3 Encounters:  12/06/12 139 lb (63.05 kg)  11/21/12 137 lb 12.8 oz (62.506 kg)  06/14/12 138 lb (62.596 kg)   Constitutional:  He appears well-developed and well-nourished. No distress. Very hard of hearing, bilateral hearing aids in place Neck: Normal range of motion. Neck supple. No JVD present.  No thyromegaly present.  Cardiovascular: Normal rate, regular rhythm and normal heart sounds.  No murmur heard. no BLE edema Pulmonary/Chest: Effort normal and breath sounds normal. No respiratory distress. no wheezes.  Neurological: he is alert and oriented to person, place, and time. No cranial nerve deficit. Coordination, Balance, speech and recall are normal.  Skin: Skin is warm and dry.  No erythema or ulceration.  Psychiatric: he has a normal mood and affect. behavior is normal. Judgment and thought content normal.   Lab Results  Component Value Date   WBC 6.0 11/21/2012   HGB 13.6 11/21/2012   HCT 40.7 11/21/2012   PLT 216.0 11/21/2012   GLUCOSE 90 11/21/2012   CHOL 149 06/14/2012   TRIG 199.0* 06/14/2012   HDL 42.90 06/14/2012   LDLCALC 66 06/14/2012   ALT 12 11/21/2012   AST 19 11/21/2012   NA 137 11/21/2012   K 4.3 11/21/2012   CL 103 11/21/2012   CREATININE 1.0 11/21/2012   BUN 12 11/21/2012   CO2 29 11/21/2012   TSH 3.36 11/21/2012   INR 1.19 08/27/2011   Ct Head Wo Contrast  11/24/2012  *RADIOLOGY REPORT*  Clinical Data: Daily headaches  CT HEAD WITHOUT CONTRAST  Technique:  Contiguous axial images were obtained from the base of the skull through the vertex without contrast.  Comparison: None.  Findings: Generalized atrophy, mildly advanced for age.  Chronic microvascular ischemia in the white matter.  Negative for acute infarct.  Negative for hemorrhage or mass lesion.  No midline shift.  Atherosclerotic disease.  Calvarium is intact.  IMPRESSION: Atrophy and chronic microvascular ischemia.  No acute abnormality.   Original Report Authenticated By: Janeece Riggers, M.D.      Assessment & Plan:   dyspnea on exertion - chronic, no hypoxia - reviewed pulm eval 12/2011 with Vassie Loll - normal spiromtry - CXR report from [place] reviewed in EMR ("cleared PNA") - recheck DG now to reassure remains clear - also refer for full PFTs- has seen cards Katrinka Blazing) and reports "heart is ok" - consider follow up pulm  eval if symptoms persist  Chronic daily headache, exam nonfocal. History and exam remain benign.  Improves with Tylenol, ?possible med side effect. Reviewed normal head CT 11/2012  -reassurance provided  Also See problem list. Medications and labs reviewed today.

## 2012-12-13 ENCOUNTER — Ambulatory Visit: Payer: Medicare Other | Admitting: Internal Medicine

## 2012-12-21 ENCOUNTER — Ambulatory Visit (INDEPENDENT_AMBULATORY_CARE_PROVIDER_SITE_OTHER): Payer: Medicare Other | Admitting: Family Medicine

## 2012-12-21 VITALS — BP 130/72 | HR 60 | Temp 98.1°F | Resp 18 | Wt 135.0 lb

## 2012-12-21 DIAGNOSIS — R32 Unspecified urinary incontinence: Secondary | ICD-10-CM

## 2012-12-21 DIAGNOSIS — N410 Acute prostatitis: Secondary | ICD-10-CM

## 2012-12-21 DIAGNOSIS — R300 Dysuria: Secondary | ICD-10-CM

## 2012-12-21 DIAGNOSIS — N400 Enlarged prostate without lower urinary tract symptoms: Secondary | ICD-10-CM

## 2012-12-21 LAB — POCT UA - MICROSCOPIC ONLY
Casts, Ur, LPF, POC: NEGATIVE
Yeast, UA: NEGATIVE

## 2012-12-21 LAB — POCT URINALYSIS DIPSTICK
Bilirubin, UA: NEGATIVE
Ketones, UA: NEGATIVE

## 2012-12-21 LAB — POCT CBC
Granulocyte percent: 82.5 %G — AB (ref 37–80)
HCT, POC: 42.4 % — AB (ref 43.5–53.7)
POC Granulocyte: 10.7 — AB (ref 2–6.9)
POC LYMPH PERCENT: 11.4 %L (ref 10–50)
Platelet Count, POC: 273 10*3/uL (ref 142–424)
RBC: 4.51 M/uL — AB (ref 4.69–6.13)
RDW, POC: 13.6 %

## 2012-12-21 MED ORDER — CIPROFLOXACIN HCL 500 MG PO TABS: 500.0000 mg | ORAL_TABLET | Freq: Two times a day (BID) | ORAL | Status: DC

## 2012-12-21 NOTE — Progress Notes (Signed)
[address]   [place], [place]  [address]   [phone]  Subjective:    Patient ID: Shaun James, male    DOB: 09-14-1930, 79 y.o.   MRN: [id number]  HPI This 79 y.o. male presents for evaluation of urinary incontinence, dysuria, dizziness.  History of prostate problems; similar symptoms in past one year ago; +acute prostatitis; treated for 1-2 weeks with recurrent symptoms that required hospitalization.  +Urinary incontinence/leakage today.  Onset this afternoon.  +feverish before coming into office; +chills; +aching.  +dysuria.  +Leaking urine.  +urgency and +hesitancy.  No abdominal pain. +nausea; no vomiting; +decreased appetite.  +HA; head fuzzy.  No diarrhea or constipation; did have diarrhea two days ago.  History of prostatitis.  Required hospitalization at last episode.  +back aching; no back pain or flank pain.   Review of Systems  Constitutional: Positive for fever and chills. Negative for diaphoresis and fatigue.  Genitourinary: Positive for dysuria, urgency, frequency, decreased urine volume, enuresis and difficulty urinating. Negative for hematuria, flank pain, discharge, penile swelling, scrotal swelling, genital sores, penile pain and testicular pain.  Musculoskeletal: Negative for back pain.  Neurological: Positive for light-headedness and headaches.        Past Medical History  Diagnosis Date  . Hypertension   . Coronary artery disease     s/p CABG '98  . Myocardial infarction   . Atrial flutter   . SSS (sick sinus syndrome)     syncope, s/p ppm 12/12  . Arthritis   . Allergic rhinitis   . Osteoarthritis 06/14/2012    Past Surgical History  Procedure Laterality Date  . Coronary artery bypass graft  1998  . Insert / replace / remove pacemaker  08/27/2011  . Hemorrhoid surgery  2012  . Cataracts      bilateral  . Cardiac catheterization  2002  . Left rotator cuff surgery  2000    Prior to Admission medications   Medication Sig Start Date End Date Taking?  Authorizing Provider  aspirin 81 MG tablet Take 81 mg by mouth daily.   Yes Historical Provider, MD  celecoxib (CELEBREX) 200 MG capsule Take 200 mg by mouth daily as needed. For pain    Yes Historical Provider, MD  Coenzyme Q10 (CO Q 10 PO) Take 1 tablet by mouth daily.     Yes Historical Provider, MD  diltiazem (CARDIZEM LA) 240 MG 24 hr tablet Take 240 mg by mouth daily.   Yes Historical Provider, MD  fish oil-omega-3 fatty acids 1000 MG capsule Take 1 g by mouth daily.     Yes Historical Provider, MD  nitroGLYCERIN (NITROSTAT) 0.4 MG SL tablet Place 0.4 mg under the tongue every 5 (five) minutes as needed. For chest pain    Yes Historical Provider, MD  ranolazine (RANEXA) 500 MG 12 hr tablet Take 500 mg by mouth 2 (two) times daily.     Yes Historical Provider, MD  simvastatin (ZOCOR) 40 MG tablet Take 20 mg by mouth at bedtime.     Yes Historical Provider, MD  Tamsulosin HCl (FLOMAX) 0.4 MG CAPS Take 0.4 mg by mouth daily.     Yes Historical Provider, MD  valsartan (DIOVAN) 320 MG tablet Take 80 mg by mouth daily.     Yes Historical Provider, MD  vitamin E 100 UNIT capsule Take 100 Units by mouth daily.     Yes Historical Provider, MD  ciprofloxacin (CIPRO) 500 MG tablet Take 1 tablet (500 mg total) by mouth 2 (  two) times daily. 12/21/12   Ethelda Chick, MD    Allergies  Allergen Reactions  . Zyrtec (Cetirizine Hcl) Other (See Comments)    Unknown   . Penicillins Rash    History   Social History  . Marital Status: Married    Spouse Name: N/A    Number of Children: 4  . Years of Education: N/A   Occupational History  . retired     retired Acupuncturist   Social History Main Topics  . Smoking status: Former Smoker -- 0.50 packs/day for 10 years    Types: Cigarettes    Quit date: 09/13/1965  . Smokeless tobacco: Never Used  . Alcohol Use: No  . Drug Use: No  . Sexually Active: Not Currently   Other Topics Concern  . Not on file   Social History Narrative    Moved from [place] in 1958    Family History  Problem Relation Age of Onset  . Coronary artery disease    . Other Father 20    [place]  . Heart attack Mother 48  . COPD Brother   . Hypertension Brother   . Heart disease Brother     Objective:   Physical Exam  Nursing note and vitals reviewed. Constitutional: He appears well-developed and well-nourished. No distress.  Cardiovascular: Regular rhythm and normal heart sounds.  Bradycardia present.   Pulmonary/Chest: Effort normal and breath sounds normal. He has no wheezes. He has no rales.  Abdominal: Soft. Bowel sounds are normal. He exhibits no distension. There is no tenderness. There is no rebound and no guarding. Hernia confirmed negative in the right inguinal area and confirmed negative in the left inguinal area.  Genitourinary: Penis normal. Prostate is enlarged and tender. Right testis shows no mass, no swelling and no tenderness. Left testis shows no mass, no swelling and no tenderness. No penile tenderness.  Lymphadenopathy:       Right: No inguinal adenopathy present.       Left: No inguinal adenopathy present.  Skin: He is not diaphoretic.   Results for orders placed in visit on 12/21/12  GLUCOSE, POCT (MANUAL RESULT ENTRY)      Result Value Range   POC Glucose 102 (*) 70 - 99 mg/dl  POCT UA - MICROSCOPIC ONLY      Result Value Range   WBC, Ur, HPF, POC 25-35     RBC, urine, microscopic 2-3     Bacteria, U Microscopic 1+     Mucus, UA trace     Epithelial cells, urine per micros 2-3     Crystals, Ur, HPF, POC neg     Casts, Ur, LPF, POC neg     Yeast, UA neg    POCT CBC      Result Value Range   WBC 13.0 (*) 4.6 - 10.2 K/uL   Lymph, poc 1.5  0.6 - 3.4   POC LYMPH PERCENT 11.4  10 - 50 %L   MID (cbc) 0.8  0 - 0.9   POC MID % 6.1  0 - 12 %M   POC Granulocyte 10.7 (*) 2 - 6.9   Granulocyte percent 82.5 (*) 37 - 80 %G   RBC 4.51 (*) 4.69 - 6.13 M/uL   Hemoglobin 13.4 (*) 14.1 - 18.1 g/dL   HCT, POC 40.9 (*)  81.1 - 53.7 %   MCV 94.1  80 - 97 fL   MCH, POC 29.7  27 - 31.2 pg   MCHC 31.6 (*)  31.8 - 35.4 g/dL   RDW, POC 16.1     Platelet Count, POC 273  142 - 424 K/uL   MPV 8.1  0 - 99.8 fL  POCT URINALYSIS DIPSTICK      Result Value Range   Color, UA yellow     Clarity, UA cloudy     Glucose, UA neg     Bilirubin, UA neg     Ketones, UA neg     Spec Grav, UA 1.020     Blood, UA trace     pH, UA 7.0     Protein, UA trace     Urobilinogen, UA 1.0     Nitrite, UA positive     Leukocytes, UA moderate (2+)         Assessment & Plan:  Dysuria - Plan: POCT glucose (manual entry), POCT UA - Microscopic Only, Urine culture, POCT urinalysis dipstick  Urinary incontinence - Plan: Basic metabolic panel, POCT urinalysis dipstick  Acute prostatitis - Plan: PSA, POCT CBC, ciprofloxacin (CIPRO) 500 MG tablet  BPH (benign prostatic hyperplasia)   1.  Acute Prostatitis:  New.  Rx for Cipro provided for one month; send PSA and urine culture.  Supportive care with rest, hydration, Tylenol for fever. 2.  Urinary Incontinence:  New. Secondary to acute infection.   3. BPH: chronic issue for patient.   Meds ordered this encounter  Medications  . ciprofloxacin (CIPRO) 500 MG tablet    Sig: Take 1 tablet (500 mg total) by mouth 2 (two) times daily.    Dispense:  60 tablet    Refill:  0

## 2012-12-21 NOTE — Patient Instructions (Addendum)
Dysuria - Plan: POCT glucose (manual entry), POCT UA - Microscopic Only, Urine culture, POCT urinalysis dipstick  Urinary incontinence - Plan: Basic metabolic panel, POCT urinalysis dipstick  Acute prostatitis - Plan: PSA, POCT CBC, ciprofloxacin (CIPRO) 500 MG tablet  BPH (benign prostatic hyperplasia)

## 2012-12-22 ENCOUNTER — Observation Stay (HOSPITAL_COMMUNITY)
Admission: EM | Admit: 2012-12-22 | Discharge: 2012-12-24 | Disposition: A | Discharge status: Home / Self Care | Payer: Medicare Other | Attending: Internal Medicine | Admitting: Internal Medicine

## 2012-12-22 ENCOUNTER — Telehealth: Payer: Self-pay | Admitting: Internal Medicine

## 2012-12-22 ENCOUNTER — Encounter (HOSPITAL_COMMUNITY): Payer: Self-pay | Admitting: Neurology

## 2012-12-22 DIAGNOSIS — R112 Nausea with vomiting, unspecified: Principal | ICD-10-CM | POA: Diagnosis present

## 2012-12-22 DIAGNOSIS — R0609 Other forms of dyspnea: Secondary | ICD-10-CM

## 2012-12-22 DIAGNOSIS — I219 Acute myocardial infarction, unspecified: Secondary | ICD-10-CM

## 2012-12-22 DIAGNOSIS — I10 Essential (primary) hypertension: Secondary | ICD-10-CM | POA: Insufficient documentation

## 2012-12-22 DIAGNOSIS — E785 Hyperlipidemia, unspecified: Secondary | ICD-10-CM | POA: Diagnosis present

## 2012-12-22 DIAGNOSIS — I495 Sick sinus syndrome: Secondary | ICD-10-CM

## 2012-12-22 DIAGNOSIS — M1990 Unspecified osteoarthritis, unspecified site: Secondary | ICD-10-CM | POA: Diagnosis present

## 2012-12-22 DIAGNOSIS — Z951 Presence of aortocoronary bypass graft: Secondary | ICD-10-CM | POA: Insufficient documentation

## 2012-12-22 DIAGNOSIS — Z950 Presence of cardiac pacemaker: Secondary | ICD-10-CM | POA: Insufficient documentation

## 2012-12-22 DIAGNOSIS — R0989 Other specified symptoms and signs involving the circulatory and respiratory systems: Secondary | ICD-10-CM

## 2012-12-22 DIAGNOSIS — I4892 Unspecified atrial flutter: Secondary | ICD-10-CM

## 2012-12-22 DIAGNOSIS — N410 Acute prostatitis: Secondary | ICD-10-CM | POA: Diagnosis present

## 2012-12-22 DIAGNOSIS — I25810 Atherosclerosis of coronary artery bypass graft(s) without angina pectoris: Secondary | ICD-10-CM | POA: Diagnosis present

## 2012-12-22 DIAGNOSIS — I2510 Atherosclerotic heart disease of native coronary artery without angina pectoris: Secondary | ICD-10-CM | POA: Insufficient documentation

## 2012-12-22 LAB — CG4 I-STAT (LACTIC ACID): Lactic Acid, Venous: 1.42 mmol/L (ref 0.5–2.2)

## 2012-12-22 LAB — BASIC METABOLIC PANEL
Glucose, Bld: 92 mg/dL (ref 70–99)
Potassium: 4 mEq/L (ref 3.5–5.3)
Sodium: 138 mEq/L (ref 135–145)

## 2012-12-22 MED ORDER — ONDANSETRON HCL 4 MG/2ML IJ SOLN: 4.0000 mg | Freq: Four times a day (QID) | INTRAMUSCULAR | Status: AC | PRN

## 2012-12-22 MED ORDER — ONDANSETRON HCL 4 MG/2ML IJ SOLN: 4.0000 mg | Freq: Four times a day (QID) | INTRAMUSCULAR | Status: DC | PRN

## 2012-12-22 MED ORDER — DILTIAZEM HCL ER COATED BEADS 240 MG PO TB24: 240.0000 mg | ORAL_TABLET | Freq: Every day | ORAL | Status: DC

## 2012-12-22 MED ORDER — OMEGA-3-ACID ETHYL ESTERS 1 G PO CAPS
1.0000 g | ORAL_CAPSULE | Freq: Every day | ORAL | Status: DC
  Administered 2012-12-23 (×2): 1 g via ORAL
  Filled 2012-12-22 (×3): qty 1

## 2012-12-22 MED ORDER — HYDROMORPHONE HCL PF 1 MG/ML IJ SOLN: 0.5000 mg | INTRAMUSCULAR | Status: DC | PRN

## 2012-12-22 MED ORDER — CIPROFLOXACIN IN D5W 400 MG/200ML IV SOLN
400.0000 mg | Freq: Two times a day (BID) | INTRAVENOUS | Status: DC
  Administered 2012-12-23 – 2012-12-24 (×3): 400 mg via INTRAVENOUS
  Filled 2012-12-22 (×4): qty 200

## 2012-12-22 MED ORDER — OXYCODONE HCL 5 MG PO TABS: 5.0000 mg | ORAL_TABLET | ORAL | Status: DC | PRN

## 2012-12-22 MED ORDER — RANOLAZINE ER 500 MG PO TB12
500.0000 mg | ORAL_TABLET | Freq: Two times a day (BID) | ORAL | Status: DC
  Administered 2012-12-23 – 2012-12-24 (×4): 500 mg via ORAL
  Filled 2012-12-22 (×5): qty 1

## 2012-12-22 MED ORDER — CELECOXIB 200 MG PO CAPS
200.0000 mg | ORAL_CAPSULE | Freq: Every day | ORAL | Status: DC | PRN
  Filled 2012-12-22: qty 1

## 2012-12-22 MED ORDER — ALUM & MAG HYDROXIDE-SIMETH 200-200-20 MG/5ML PO SUSP: 30.0000 mL | Freq: Four times a day (QID) | ORAL | Status: DC | PRN

## 2012-12-22 MED ORDER — VITAMIN E 180 MG (400 UNIT) PO CAPS
400.0000 [IU] | ORAL_CAPSULE | Freq: Every day | ORAL | Status: DC
  Administered 2012-12-23 – 2012-12-24 (×3): 400 [IU] via ORAL
  Filled 2012-12-22 (×3): qty 1

## 2012-12-22 MED ORDER — NITROGLYCERIN 0.4 MG SL SUBL: 0.4000 mg | SUBLINGUAL_TABLET | SUBLINGUAL | Status: DC | PRN

## 2012-12-22 MED ORDER — SODIUM CHLORIDE 0.9 % IV SOLN
INTRAVENOUS | Status: DC
  Administered 2012-12-22 – 2012-12-24 (×3): via INTRAVENOUS

## 2012-12-22 MED ORDER — ENOXAPARIN SODIUM 40 MG/0.4ML ~~LOC~~ SOLN
40.0000 mg | SUBCUTANEOUS | Status: DC
  Administered 2012-12-23 (×2): 40 mg via SUBCUTANEOUS
  Filled 2012-12-22 (×3): qty 0.4

## 2012-12-22 MED ORDER — DEXTROSE-NACL 5-0.45 % IV SOLN
INTRAVENOUS | Status: DC
  Administered 2012-12-22: 20:00:00 via INTRAVENOUS

## 2012-12-22 MED ORDER — TAMSULOSIN HCL 0.4 MG PO CAPS
0.4000 mg | ORAL_CAPSULE | Freq: Every evening | ORAL | Status: DC
  Administered 2012-12-23 (×2): 0.4 mg via ORAL
  Filled 2012-12-22 (×3): qty 1

## 2012-12-22 MED ORDER — ACETAMINOPHEN 650 MG RE SUPP: 650.0000 mg | Freq: Four times a day (QID) | RECTAL | Status: DC | PRN

## 2012-12-22 MED ORDER — CIPROFLOXACIN IN D5W 400 MG/200ML IV SOLN
400.0000 mg | Freq: Once | INTRAVENOUS | Status: AC
  Administered 2012-12-22: 400 mg via INTRAVENOUS
  Filled 2012-12-22: qty 200

## 2012-12-22 MED ORDER — OMEGA-3 FATTY ACIDS 1000 MG PO CAPS: 1.0000 g | ORAL_CAPSULE | Freq: Every day | ORAL | Status: DC

## 2012-12-22 MED ORDER — ZOLPIDEM TARTRATE 5 MG PO TABS
5.0000 mg | ORAL_TABLET | Freq: Every evening | ORAL | Status: DC | PRN
  Administered 2012-12-24: 5 mg via ORAL
  Filled 2012-12-22: qty 1

## 2012-12-22 MED ORDER — ONDANSETRON HCL 4 MG PO TABS: 4.0000 mg | ORAL_TABLET | Freq: Four times a day (QID) | ORAL | Status: DC | PRN

## 2012-12-22 MED ORDER — ASPIRIN EC 81 MG PO TBEC
81.0000 mg | DELAYED_RELEASE_TABLET | Freq: Every day | ORAL | Status: DC
  Administered 2012-12-23 – 2012-12-24 (×2): 81 mg via ORAL
  Filled 2012-12-22 (×2): qty 1

## 2012-12-22 MED ORDER — CO Q 10 10 MG PO CAPS: 10.0000 mg | ORAL_CAPSULE | Freq: Every day | ORAL | Status: DC

## 2012-12-22 MED ORDER — DILTIAZEM HCL ER COATED BEADS 240 MG PO CP24
240.0000 mg | ORAL_CAPSULE | Freq: Every day | ORAL | Status: DC
  Administered 2012-12-23 – 2012-12-24 (×2): 240 mg via ORAL
  Filled 2012-12-22 (×2): qty 1

## 2012-12-22 MED ORDER — ACETAMINOPHEN 325 MG PO TABS
650.0000 mg | ORAL_TABLET | Freq: Four times a day (QID) | ORAL | Status: DC | PRN
  Administered 2012-12-22: 650 mg via ORAL
  Filled 2012-12-22: qty 2

## 2012-12-22 NOTE — ED Provider Notes (Signed)
History     CSN: [id number]  Arrival date & time 12/22/12  1603   First MD Initiated Contact with Patient 12/22/12 1633      Chief Complaint  Patient presents with  . Nausea    (Consider location/radiation/quality/duration/timing/severity/associated sxs/prior treatment) HPI Comments: 79 y M with PMH of CAD s/p CABG,HTN, Aflutter/SSS here for eval 2/2 vomiting and inability to take his antibiotics.  He was seen at [facility] yesterday for new urinary incontinence and diagnosed with prostatis.   Patient is a 79 y.o. male presenting with dysuria. The history is provided by the patient and a relative.  Dysuria  This is a new problem. The current episode started more than 2 days ago. The problem occurs every urination. The problem has been gradually worsening. There has been no fever. Associated symptoms include vomiting. Pertinent negatives include no chills and no sweats. Associated symptoms comments: Malaise, myalgias. Treatments tried: cipro - took 2 doses.    Past Medical History  Diagnosis Date  . Hypertension   . Coronary artery disease     s/p CABG '98  . Myocardial infarction   . Atrial flutter   . SSS (sick sinus syndrome)     syncope, s/p ppm 12/12  . Arthritis   . Allergic rhinitis   . Osteoarthritis 06/14/2012    Past Surgical History  Procedure Laterality Date  . Coronary artery bypass graft  1998  . Insert / replace / remove pacemaker  08/27/2011  . Hemorrhoid surgery  2012  . Cataracts      bilateral  . Cardiac catheterization  2002  . Left rotator cuff surgery  2000    Family History  Problem Relation Age of Onset  . Coronary artery disease    . Other Father 23    [place]  . Heart attack Mother 98  . COPD Brother   . Hypertension Brother   . Heart disease Brother     History  Substance Use Topics  . Smoking status: Former Smoker -- 0.50 packs/day for 10 years    Types: Cigarettes    Quit date: 09/13/1965  . Smokeless tobacco: Never Used  . Alcohol  Use: No      Review of Systems  Constitutional: Negative for fever and chills.  Respiratory: Negative for cough and shortness of breath.   Cardiovascular: Negative for chest pain.  Gastrointestinal: Positive for vomiting. Negative for abdominal pain and diarrhea.  Genitourinary: Positive for dysuria.  All other systems reviewed and are negative.    Allergies  Zyrtec and Penicillins  Home Medications   Current Outpatient Rx  Name  Route  Sig  Dispense  Refill  . Acetaminophen (ACETAMIN PO)   Oral   Take 1 capsule by mouth daily as needed (for headache).         Marland Kitchen aspirin EC 81 MG tablet   Oral   Take 81 mg by mouth daily.         . celecoxib (CELEBREX) 200 MG capsule   Oral   Take 200 mg by mouth daily as needed for pain.          . ciprofloxacin (CIPRO) 500 MG tablet   Oral   Take 1 tablet (500 mg total) by mouth 2 (two) times daily.   60 tablet   0   . Coenzyme Q10 (CO Q 10 PO)   Oral   Take 1 tablet by mouth daily.           Marland Kitchen diltiazem (  CARDIZEM LA) 240 MG 24 hr tablet   Oral   Take 240 mg by mouth daily.         . fish oil-omega-3 fatty acids 1000 MG capsule   Oral   Take 1 g by mouth daily.           . nitroGLYCERIN (NITROSTAT) 0.4 MG SL tablet   Sublingual   Place 0.4 mg under the tongue every 5 (five) minutes as needed for chest pain.          Marland Kitchen PRESCRIPTION MEDICATION   Oral   Take 1 tablet by mouth at bedtime. For cholesterol         . ranolazine (RANEXA) 500 MG 12 hr tablet   Oral   Take 500 mg by mouth 2 (two) times daily.           . Tamsulosin HCl (FLOMAX) 0.4 MG CAPS   Oral   Take 0.4 mg by mouth every evening.          . valsartan (DIOVAN) 320 MG tablet   Oral   Take 106.6 mg by mouth daily. 1/3 tablet         . vitamin E 400 UNIT capsule   Oral   Take 400 Units by mouth daily.           BP 109/63  Pulse 71  Temp(Src) 99.2 F (37.3 C) (Oral)  Resp 14  SpO2 96%  Physical Exam  Vitals  reviewed. Constitutional: He is oriented to person, place, and time. He appears well-developed and well-nourished. No distress.  HENT:  Head: Normocephalic.  Right Ear: External ear normal.  Left Ear: External ear normal.  Nose: Nose normal.  Mouth/Throat: Oropharynx is clear and moist. No oropharyngeal exudate.  Eyes: Conjunctivae and EOM are normal. Pupils are equal, round, and reactive to light.  Neck: Normal range of motion. Neck supple.  Cardiovascular: Normal rate, regular rhythm, normal heart sounds and intact distal pulses.  Exam reveals no gallop and no friction rub.   No murmur heard. Pulmonary/Chest: Effort normal and breath sounds normal.  Abdominal: Soft. Bowel sounds are normal. He exhibits no distension. There is tenderness (mild suprapubic).  Musculoskeletal: Normal range of motion. He exhibits no edema and no tenderness.  Neurological: He is alert and oriented to person, place, and time. No cranial nerve deficit.  Skin: Skin is warm and dry.  Psychiatric: He has a normal mood and affect.    ED Course  Procedures (including critical care time)  Labs Reviewed  URINE CULTURE  BASIC METABOLIC PANEL  CBC  CG4 I-STAT (LACTIC ACID)   No results found.   1. Nausea and vomiting   2. Prostatitis, acute   3 UTI            MDM   79 y M with PMH of CAD s/p CABG,HTN, Aflutter/SSS here for eval 2/2 vomiting and inability to take his antibiotics.  He was seen at [facility] yesterday for new urinary incontinence and diagnosed with prostatis.  UA c/w UTI.  No culture able to be found.  Repeat prostate exam deferred out of concern for inducing a bacteremia.  Abd soft, with mild suprapubic TTP.  Lungs clear.  BPs soft, but pt appears well perfused.  Will eval with urine culture, lactate, chem 8 and give IV cipro as it is felt this is still appropriate coverage for his process.  8:10 PM Medicine consulted for admission for IV hydration and IV abx.  Lactate WNLs.  Pt in agreement with  plan.  Pt seen in conjunction with my attending, Dr. Deretha Emory.  Oleh Genin, MD PGY-II [facility] Emergency Medicine Resident   Oleh Genin, MD 12/23/12 [phone]

## 2012-12-22 NOTE — ED Notes (Signed)
Water given to pt. Per EDP's verbal orders.

## 2012-12-22 NOTE — H&P (Signed)
[facility] History and Physical  Shaun James:[id number] DOB: March 31, 1931 DOA: 12/22/2012  Referring physician: EDP PCP: Rene Paci, MD  Specialists:   Chief Complaint: Nausea and Vomiting  HPI: Shaun James is a 79 y.o. male who was seen at the [address] [facility] yesterday and diagnosed with acute Prostatitis after symptoms of dysuria and incontinence X 1 week.  He was discharged to home on PO Cipro who presented to   the ED with complaints of increase N+V, and dry heaves after starting the Cipro.   He has not been able to hold down foods or liquids.  He denies any hematemesis.   He has had fevers and chills at home.      Review of Systems: The patient denies anorexia, fever, weight loss,, vision loss, decreased hearing, hoarseness, chest pain, syncope, dyspnea on exertion, peripheral edema, balance deficits, hemoptysis, abdominal pain, melena, hematochezia, severe indigestion/heartburn, hematuria, incontinence, genital sores, muscle weakness, suspicious skin lesions, transient blindness, difficulty walking, depression, unusual weight change, abnormal bleeding, enlarged lymph nodes, angioedema, and breast masses.    Past Medical History  Diagnosis Date  . Hypertension   . Coronary artery disease     s/p CABG '98  . Myocardial infarction   . Atrial flutter   . SSS (sick sinus syndrome)     syncope, s/p ppm 12/12  . Arthritis   . Allergic rhinitis   . Osteoarthritis 06/14/2012     Past Surgical History  Procedure Laterality Date  . Coronary artery bypass graft  1998  . Insert / replace / remove pacemaker  08/27/2011  . Hemorrhoid surgery  2012  . Cataracts      bilateral  . Cardiac catheterization  2002  . Left rotator cuff surgery  2000     Medications:  HOME MEDS: Prior to Admission medications   Medication Sig Start Date End Date Taking? Authorizing Provider  Acetaminophen (ACETAMIN PO) Take 1 capsule by mouth daily as needed (for headache).   Yes Historical  Provider, MD  aspirin EC 81 MG tablet Take 81 mg by mouth daily.   Yes Historical Provider, MD  celecoxib (CELEBREX) 200 MG capsule Take 200 mg by mouth daily as needed for pain.    Yes Historical Provider, MD  ciprofloxacin (CIPRO) 500 MG tablet Take 1 tablet (500 mg total) by mouth 2 (two) times daily. 12/21/12  Yes Ethelda Chick, MD  Coenzyme Q10 (CO Q 10 PO) Take 1 tablet by mouth daily.     Yes Historical Provider, MD  diltiazem (CARDIZEM LA) 240 MG 24 hr tablet Take 240 mg by mouth daily.   Yes Historical Provider, MD  fish oil-omega-3 fatty acids 1000 MG capsule Take 1 g by mouth daily.     Yes Historical Provider, MD  GINSENG PO Take 1 capsule by mouth 2 (two) times daily.   Yes Historical Provider, MD  nitroGLYCERIN (NITROSTAT) 0.4 MG SL tablet Place 0.4 mg under the tongue every 5 (five) minutes as needed for chest pain.    Yes Historical Provider, MD  PRESCRIPTION MEDICATION Take 1 tablet by mouth at bedtime. For cholesterol   Yes Historical Provider, MD  ranolazine (RANEXA) 500 MG 12 hr tablet Take 500 mg by mouth 2 (two) times daily.     Yes Historical Provider, MD  Tamsulosin HCl (FLOMAX) 0.4 MG CAPS Take 0.4 mg by mouth every evening.    Yes Historical Provider, MD  valsartan (DIOVAN) 320 MG tablet Take 106.6 mg by mouth daily.  1/3 tablet   Yes Historical Provider, MD  vitamin E 400 UNIT capsule Take 400 Units by mouth daily.   Yes Historical Provider, MD    Allergies:  Allergies  Allergen Reactions  . Zyrtec (Cetirizine Hcl) Other (See Comments)    Unknown   . Penicillins Rash    Social History:   reports that he quit smoking about 47 years ago. His smoking use included Cigarettes. He has a 5 pack-year smoking history. He has never used smokeless tobacco. He reports that he does not drink alcohol or use illicit drugs.  Family History: Family History  Problem Relation Age of Onset  . Coronary artery disease    . Other Father 7    [place]  . Heart attack Mother 35   . COPD Brother   . Hypertension Brother   . Heart disease Brother      Physical Exam:  GEN:  Pleasant Elderly thin 79 year old well nourished and well developed Asian Male examined  and in no acute distress; cooperative with exam Filed Vitals:   12/22/12 1900 12/22/12 1915 12/22/12 1930 12/22/12 1945  BP: 129/77 110/63 116/59 115/60  Pulse: 70 68 66 66  Temp:      TempSrc:      Resp:      SpO2: 97% 96% 97% 98%   Blood pressure 115/60, pulse 66, temperature 99.2 F (37.3 C), temperature source Oral, resp. rate 14, SpO2 98.00%. PSYCH: He is alert and oriented x4; does not appear anxious does not appear depressed; affect is normal HEENT: Normocephalic and Atraumatic, Mucous membranes pink; PERRLA; EOM intact; Fundi:  Benign;  No scleral icterus, Nares: Patent, Oropharynx: Clear, Fair Dentition, Neck:  FROM, no cervical lymphadenopathy nor thyromegaly or carotid bruit; no JVD; Breasts:: Not examined CHEST WALL: No tenderness CHEST: Normal respiration, clear to auscultation bilaterally HEART: Regular rate and rhythm; no murmurs rubs or gallops BACK: No kyphosis or scoliosis; no CVA tenderness ABDOMEN: Positive Bowel Sounds, Scaphoid, soft non-tender; no masses, no organomegaly.    Rectal Exam: Not done EXTREMITIES: No cyanosis, clubbing or edema; no ulcerations. Genitalia: not examined PULSES: 2+ and symmetric SKIN: Normal hydration no rash or ulceration,  Diffuse erythematous plaques on distal Lower Left Extremity (= [place]) CNS: Cranial nerves 2-12 grossly intact no focal neurologic deficit     Labs & Imaging Results for orders placed during the hospital encounter of 12/22/12 (from the past 48 hour(s))  CG4 I-STAT (LACTIC ACID)     Status: None   Collection Time    12/22/12  5:57 PM      Result Value Range   Lactic Acid, Venous 1.42  0.5 - 2.2 mmol/L     Assessment/Plan Principal Problem:   Nausea and vomiting Active Problems:   HYPERLIPIDEMIA   CAD (coronary  artery disease)   Osteoarthritis   Prostatitis, acute   1.   Nausea and Vomiting - due to PO antibiotic,  Antiemetics PRN to control nausea, and placed on IV Cipro for now.     2.   Acute Prostatitis-  Urine C+S sent, IV Cipro, adjust when Culture results return.     3.   CAD-stable on medications.     4.   Hyperlipidemia-  Continue, Omega 3 Fish Oil.     5.   Osteoarthritis-  Pain Rx PRN.        Code Status:  FULL CODE Family Communication:  Daughter at Bedside       Disposition Plan:  Return to Home on Discharge  Time spent: 60 minutes  Ron Parker [facility] Pager [phone]  If 7PM-7AM, please contact night-coverage [email or web address] Password [email or web address] 12/22/2012, 8:30 PM

## 2012-12-22 NOTE — ED Provider Notes (Signed)
I saw and evaluated the patient, reviewed the resident's note and I agree with the findings and plan.  The patient seen by me. Started on Cipro yesterday for an enlarged tender prostate felt to be related to prostatitis. Patient had a urinalysis done at that time and had some abnormalities no urine culture was done. Patient based on his age has been struggling not been well at home up a lot PE will most likely require admission some concern that there may be a pre-septic picture here. We'll get a lactic acid some basic labs and we'll work towards admission we'll continue Cipro is only had oral Cipro less than 24 hours we'll continue with IV. Patient currently is mentating blood pressures have been in the low 100s occasionally had a blood pressure in the upper 80s but we feel that this was due to his arm being bent. He is not tachycardic.     Shelda Jakes, MD 12/22/12 [phone]

## 2012-12-22 NOTE — ED Notes (Signed)
Per ems- Pt comes from home c/o nausea that started today, vomiting x 2. Started cipro yesterday for enlarged prostate. Given 4 mg zofran, 20 left forearm. A x 4. Denies pain.

## 2012-12-22 NOTE — Telephone Encounter (Signed)
Caller: [name]/Spouse; Phone: [phone]; Reason for Call: [name] called to say she had called 9-1-1 to take Shaun James to the E D, was Dx with a prostate infection and she feels that he needs to be admitted.  They will be going to [facility] .  Jms/can

## 2012-12-23 DIAGNOSIS — I495 Sick sinus syndrome: Secondary | ICD-10-CM

## 2012-12-23 LAB — BASIC METABOLIC PANEL
Calcium: 8.2 mg/dL — ABNORMAL LOW (ref 8.4–10.5)
GFR calc Af Amer: >90 mL/min (ref >90)
GFR calc non Af Amer: 78 mL/min — ABNORMAL LOW (ref >90)
Potassium: 3.1 mEq/L — ABNORMAL LOW (ref 3.5–5.1)
Sodium: 134 mEq/L — ABNORMAL LOW (ref 135–145)

## 2012-12-23 LAB — CBC
Hemoglobin: 12.1 g/dL — ABNORMAL LOW (ref 13.0–17.0)
MCHC: 36.2 g/dL — ABNORMAL HIGH (ref 30.0–36.0)
Platelets: 187 10*3/uL (ref 150–400)
RBC: 3.85 MIL/uL — ABNORMAL LOW (ref 4.22–5.81)

## 2012-12-23 LAB — URINE CULTURE: Colony Count: NO GROWTH

## 2012-12-23 MED ORDER — POTASSIUM CHLORIDE CRYS ER 20 MEQ PO TBCR
EXTENDED_RELEASE_TABLET | ORAL | Status: AC
  Administered 2012-12-23: 40 meq via ORAL
  Filled 2012-12-23: qty 2

## 2012-12-23 MED ORDER — POTASSIUM CHLORIDE CRYS ER 20 MEQ PO TBCR: 40.0000 meq | EXTENDED_RELEASE_TABLET | Freq: Once | ORAL | Status: AC

## 2012-12-23 NOTE — Progress Notes (Signed)
PATIENT DETAILS Name: Shaun James Age: 79 y.o. Sex: male Date of Birth: [birth year] Admit Date: 12/22/2012 Admitting Physician Ron Parker, MD ZOX:[id number] [name], MD  Subjective: No nausea of vomiting since this morning. Feels better. Hard of hearing.  Assessment/Plan: Principal Problem:   Nausea and vomiting - this was either secondary to oral ciprofloxacin or from underlying acute prostatitis/UTI - This is resolved with supportive care - Advancing diet  Active Problems: Acute prostatitis - Continue with IV ciprofloxacin, await urine culture sensitivity results - Remains afebrile but does have leukocytosis, but is nontoxic looking  History of coronary artery disease - Continue with aspirin - Currently without chest pain or shortness of breath  BPH - Continue Flomax  History of sick sinus syndrome - Status post PPM 12/12  Disposition: Remain inpatient  DVT Prophylaxis: Prophylactic Lovenox  Code Status: Full code   Procedures:  None  CONSULTS:  None  PHYSICAL EXAM: Vital signs in last 24 hours: Filed Vitals:   12/22/12 2146 12/23/12 0541 12/23/12 1017 12/23/12 1331  BP: 116/70 106/61 114/60 107/59  Pulse: 85 78  71  Temp: 99.7 F (37.6 C) 99.2 F (37.3 C)  98.5 F (36.9 C)  TempSrc: Oral Oral  Oral  Resp: 18 20    Height: 5\' 5"  (1.651 m)     Weight: 50.2 kg (110 lb 10.7 oz)     SpO2: 97% 93%  98%    Weight change:  Body mass index is 18.42 kg/(m^2).   Gen Exam: Awake and alert with clear speech.   Neck: Supple, No JVD.   Chest: B/L Clear.   CVS: S1 S2 Regular, no murmurs.  Abdomen: soft, BS +, non tender, non distended.  Extremities: no edema, lower extremities warm to touch. Neurologic: Non Focal.   Skin: No Rash.   Wounds: N/A.   Intake/Output from previous day:  Intake/Output Summary (Last 24 hours) at 12/23/12 1347 Last data filed at 12/23/12 0600  Gross per 24 hour  Intake 1022.5 ml  Output    750 ml  Net  272.5 ml      LAB RESULTS: CBC  Recent Labs Lab 12/21/12 2140 12/23/12 0700  WBC 13.0* 16.6*  HGB 13.4* 12.1*  HCT 42.4* 33.4*  PLT  --  187  MCV 94.1 86.8  MCH 29.7 31.4  MCHC 31.6* 36.2*  RDW  --  13.3    Chemistries   Recent Labs Lab 12/21/12 2138 12/23/12 0700  NA 138 134*  K 4.0 3.1*  CL 102 102  CO2 29 26  GLUCOSE 92 152*  BUN 13 10  CREATININE 0.96 0.91  CALCIUM 9.2 8.2*    CBG: No results found for this basename: GLUCAP,  in the last 168 hours  GFR Estimated Creatinine Clearance: 46.7 ml/min (by C-G formula based on Cr of 0.91).  Coagulation profile No results found for this basename: INR, PROTIME,  in the last 168 hours  Cardiac Enzymes No results found for this basename: CK, CKMB, TROPONINI, MYOGLOBIN,  in the last 168 hours  No components found with this basename: POCBNP,  No results found for this basename: DDIMER,  in the last 72 hours No results found for this basename: HGBA1C,  in the last 72 hours No results found for this basename: CHOL, HDL, LDLCALC, TRIG, CHOLHDL, LDLDIRECT,  in the last 72 hours No results found for this basename: TSH, T4TOTAL, FREET3, T3FREE, THYROIDAB,  in the last 72 hours No results found for this basename: VITAMINB12, FOLATE, FERRITIN,  TIBC, IRON, RETICCTPCT,  in the last 72 hours No results found for this basename: LIPASE, AMYLASE,  in the last 72 hours  Urine Studies No results found for this basename: UACOL, UAPR, USPG, UPH, UTP, UGL, UKET, UBIL, UHGB, UNIT, UROB, ULEU, UEPI, UWBC, URBC, UBAC, CAST, CRYS, UCOM, BILUA,  in the last 72 hours  MICROBIOLOGY: No results found for this or any previous visit (from the past 240 hour(s)).  RADIOLOGY STUDIES/RESULTS: Dg Chest 2 View  12/06/2012  *RADIOLOGY REPORT*  Clinical Data: Chronic shortness of breath.  CHEST - 2 VIEW  Comparison: 12/29/2011  Findings: Calcifications at the right base, likely calcified pleural plaques.  Prior CABG.  Left pacer is unchanged.  No confluent  airspace opacities.  No effusions.  No acute bony abnormality.  IMPRESSION: No acute cardiopulmonary disease.   Original Report Authenticated By: Charlett Nose, M.D.    Ct Head Wo Contrast  11/24/2012  *RADIOLOGY REPORT*  Clinical Data: Daily headaches  CT HEAD WITHOUT CONTRAST  Technique:  Contiguous axial images were obtained from the base of the skull through the vertex without contrast.  Comparison: None.  Findings: Generalized atrophy, mildly advanced for age.  Chronic microvascular ischemia in the white matter.  Negative for acute infarct.  Negative for hemorrhage or mass lesion.  No midline shift.  Atherosclerotic disease.  Calvarium is intact.  IMPRESSION: Atrophy and chronic microvascular ischemia.  No acute abnormality.   Original Report Authenticated By: Janeece Riggers, M.D.     MEDICATIONS: Scheduled Meds: . aspirin EC  81 mg Oral Daily  . ciprofloxacin  400 mg Intravenous Q12H  . diltiazem  240 mg Oral Daily  . enoxaparin (LOVENOX) injection  40 mg Subcutaneous Q24H  . omega-3 acid ethyl esters  1 g Oral QHS  . ranolazine  500 mg Oral BID  . tamsulosin  0.4 mg Oral QPM  . vitamin E  400 Units Oral Daily   Continuous Infusions: . sodium chloride 75 mL/hr at 12/23/12 0750   PRN Meds:.acetaminophen, acetaminophen, alum & mag hydroxide-simeth, celecoxib, HYDROmorphone (DILAUDID) injection, nitroGLYCERIN, ondansetron (ZOFRAN) IV, ondansetron, oxyCODONE, zolpidem  Antibiotics: Anti-infectives   Start     Dose/Rate Route Frequency Ordered Stop   12/23/12 0800  ciprofloxacin (CIPRO) IVPB 400 mg     400 mg 200 mL/hr over 60 Minutes Intravenous Every 12 hours 12/22/12 2002     12/22/12 1745  ciprofloxacin (CIPRO) IVPB 400 mg     400 mg 200 mL/hr over 60 Minutes Intravenous  Once 12/22/12 1737 12/22/12 2057       Jeoffrey Massed, MD  [facility] Pager:[phone]  If 7PM-7AM, please contact night-coverage [email or web address] Password [email or web address] 12/23/2012, 1:47 PM   LOS:  1 day

## 2012-12-23 NOTE — Progress Notes (Signed)
Utilization Review Completed Chelbi Herber J. Laylynn Campanella, RN, BSN, NCM [phone]  

## 2012-12-23 NOTE — Progress Notes (Signed)
Patient has had a few loose bowel movements today, patient stated, "due to iv cipro".  Doctor called and new order given for cdiff pcr and place the patient on contact to rule out cdiff.  Will continue to monitor.  Macarthur Critchley, RN

## 2012-12-23 NOTE — ED Provider Notes (Signed)
I saw and evaluated the patient, reviewed the resident's note and I agree with the findings and plan.   Diamon Reddinger W. Tommi Crepeau, MD 12/23/12 2005 

## 2012-12-23 NOTE — Progress Notes (Signed)
Ambulated patient 300 feet. Minimal assistance needed.

## 2012-12-24 LAB — BASIC METABOLIC PANEL
CO2: 25 mEq/L (ref 19–32)
Chloride: 103 mEq/L (ref 96–112)
Creatinine, Ser: 0.84 mg/dL (ref 0.50–1.35)
GFR calc Af Amer: >90 mL/min (ref >90)
Potassium: 3.7 mEq/L (ref 3.5–5.1)

## 2012-12-24 LAB — CBC
HCT: 33.3 % — ABNORMAL LOW (ref 39.0–52.0)
MCV: 86.7 fL (ref 78.0–100.0)
Platelets: 202 10*3/uL (ref 150–400)
RBC: 3.84 MIL/uL — ABNORMAL LOW (ref 4.22–5.81)
RDW: 13.2 % (ref 11.5–15.5)
WBC: 14.2 10*3/uL — ABNORMAL HIGH (ref 4.0–10.5)

## 2012-12-24 LAB — URINE CULTURE

## 2012-12-24 MED ORDER — CIPROFLOXACIN HCL 500 MG PO TABS: 500.0000 mg | ORAL_TABLET | Freq: Two times a day (BID) | ORAL | Status: DC

## 2012-12-24 MED ORDER — SACCHAROMYCES BOULARDII 250 MG PO CAPS
250.0000 mg | ORAL_CAPSULE | Freq: Two times a day (BID) | ORAL | Status: DC
  Administered 2012-12-24: 250 mg via ORAL
  Filled 2012-12-24 (×2): qty 1

## 2012-12-24 MED ORDER — SACCHAROMYCES BOULARDII 250 MG PO CAPS: 250.0000 mg | ORAL_CAPSULE | Freq: Two times a day (BID) | ORAL | Status: DC

## 2012-12-24 NOTE — Discharge Summary (Signed)
PATIENT DETAILS Name: Shaun James Age: 79 y.o. Sex: male Date of Birth: August 25, 1931 MRN: [id number]. Admit Date: 12/22/2012 Admitting Physician: Ron Parker, MD WUJ:[id number] [name], MD  Recommendations for Outpatient Follow-up:  1. Please followup urine cultures done on 4/10-preliminary results show Escherichia coli, sensitivities are pending. (The urine cultures were drawn at  urgent care)  PRIMARY DISCHARGE DIAGNOSIS:  Principal Problem:   Nausea and vomiting Active Problems:   HYPERLIPIDEMIA   CAD (coronary artery disease)   Osteoarthritis   Prostatitis, acute      PAST MEDICAL HISTORY: Past Medical History  Diagnosis Date  . Hypertension   . Coronary artery disease     s/p CABG '98  . Myocardial infarction   . Atrial flutter   . SSS (sick sinus syndrome)     syncope, s/p ppm 12/12  . Arthritis   . Allergic rhinitis   . Osteoarthritis 06/14/2012    DISCHARGE MEDICATIONS:   Medication List    TAKE these medications       ACETAMIN PO  Take 1 capsule by mouth daily as needed (for headache).     aspirin EC 81 MG tablet  Take 81 mg by mouth daily.     celecoxib 200 MG capsule  Commonly known as:  CELEBREX  Take 200 mg by mouth daily as needed for pain.     ciprofloxacin 500 MG tablet  Commonly known as:  CIPRO  Take 1 tablet (500 mg total) by mouth 2 (two) times daily.     CO Q 10 PO  Take 1 tablet by mouth daily.     diltiazem 240 MG 24 hr tablet  Commonly known as:  CARDIZEM LA  Take 240 mg by mouth daily.     fish oil-omega-3 fatty acids 1000 MG capsule  Take 1 g by mouth daily.     GINSENG PO  Take 1 capsule by mouth 2 (two) times daily.     nitroGLYCERIN 0.4 MG SL tablet  Commonly known as:  NITROSTAT  Place 0.4 mg under the tongue every 5 (five) minutes as needed for chest pain.     ranolazine 500 MG 12 hr tablet  Commonly known as:  RANEXA  Take 500 mg by mouth 2 (two) times daily.     saccharomyces boulardii 250 MG capsule   Commonly known as:  FLORASTOR  Take 1 capsule (250 mg total) by mouth 2 (two) times daily.     simvastatin 40 MG tablet  Commonly known as:  ZOCOR  Take 40 mg by mouth at bedtime.     tamsulosin 0.4 MG Caps  Commonly known as:  FLOMAX  Take 0.4 mg by mouth every evening.     valsartan 320 MG tablet  Commonly known as:  DIOVAN  Take 106.6 mg by mouth daily. 1/3 tablet     vitamin E 400 UNIT capsule  Take 400 Units by mouth daily.         BRIEF HPI:  See H&P, Labs, Consult and Test reports for all details in brief, patient is a 79 year old [place] male who was just diagnosed with acute stat typed as one day prior to this admission, and was started on ciprofloxacin presented with nausea vomiting.  CONSULTATIONS:   None  PERTINENT RADIOLOGIC STUDIES: Dg Chest 2 View  12/06/2012  *RADIOLOGY REPORT*  Clinical Data: Chronic shortness of breath.  CHEST - 2 VIEW  Comparison: 12/29/2011  Findings: Calcifications at the right base, likely calcified pleural plaques.  Prior CABG.  Left pacer is unchanged.  No confluent airspace opacities.  No effusions.  No acute bony abnormality.  IMPRESSION: No acute cardiopulmonary disease.   Original Report Authenticated By: Charlett Nose, M.D.      PERTINENT LAB RESULTS: CBC:  Recent Labs  12/23/12 0700 12/24/12 0508  WBC 16.6* 14.2*  HGB 12.1* 11.9*  HCT 33.4* 33.3*  PLT 187 202   CMET CMP     Component Value Date/Time   NA 136 12/24/2012 0508   K 3.7 12/24/2012 0508   CL 103 12/24/2012 0508   CO2 25 12/24/2012 0508   GLUCOSE 108* 12/24/2012 0508   BUN 9 12/24/2012 0508   CREATININE 0.84 12/24/2012 0508   CREATININE 0.96 12/21/2012 2138   CALCIUM 8.4 12/24/2012 0508   PROT 6.8 11/21/2012 1208   ALBUMIN 3.9 11/21/2012 1208   AST 19 11/21/2012 1208   ALT 12 11/21/2012 1208   ALKPHOS 49 11/21/2012 1208   BILITOT 0.9 11/21/2012 1208   GFRNONAA 81* 12/24/2012 0508   GFRAA >90 12/24/2012 0508    GFR Estimated Creatinine Clearance: 50.6 ml/min  (by C-G formula based on Cr of 0.84). No results found for this basename: LIPASE, AMYLASE,  in the last 72 hours No results found for this basename: CKTOTAL, CKMB, CKMBINDEX, TROPONINI,  in the last 72 hours No components found with this basename: POCBNP,  No results found for this basename: DDIMER,  in the last 72 hours No results found for this basename: HGBA1C,  in the last 72 hours No results found for this basename: CHOL, HDL, LDLCALC, TRIG, CHOLHDL, LDLDIRECT,  in the last 72 hours No results found for this basename: TSH, T4TOTAL, FREET3, T3FREE, THYROIDAB,  in the last 72 hours No results found for this basename: VITAMINB12, FOLATE, FERRITIN, TIBC, IRON, RETICCTPCT,  in the last 72 hours Coags: No results found for this basename: PT, INR,  in the last 72 hours Microbiology: Recent Results (from the past 240 hour(s))  URINE CULTURE     Status: None   Collection Time    12/21/12  9:35 PM      Result Value Range Status   Colony Count >=100,000 COLONIES/ML   Preliminary   Preliminary Report ESCHERICHIA COLI   Preliminary  URINE CULTURE     Status: None   Collection Time    12/22/12  6:00 PM      Result Value Range Status   Specimen Description URINE, CLEAN CATCH   Final   Special Requests NONE   Final   Culture  Setup Time 12/22/2012 18:44   Final   Colony Count NO GROWTH   Final   Culture NO GROWTH   Final   Report Status 12/23/2012 FINAL   Final     BRIEF HOSPITAL COURSE:   Principal Problem:   Nausea and vomiting - Not sure whether this was related to antibiotics or more likely from underlying acute UTI/prostatitis, in any event patient was admitted and initially started on liquids, given IV fluids and antiemetics. His diet was slowly advanced. He is not able to tolerate a regular diet, he is requesting that he be discharged home. His abdomen is soft and nontender.  Active Problems: Acute prostatitis/UTI - Patient has been started on ciprofloxacin in the outpatient  setting, urine cultures done at urgent care on 4/10 is positive for Escherichia coli however sensitivities are currently pending. Urine cultures done on admission on 4/11 are negative. - Patient is being discharged home today at his own request, he is  afebrile, he does have some mild leukocytosis which is now down finding. I've asked him to followup with his primary care practitioner next 1-2 days to followup on the urine culture results and maybe adjust antibiotic therapy accordingly.  History of coronary artery disease  - Continue with aspirin  - Currently without chest pain or shortness of breath   BPH  - Continue Flomax   History of sick sinus syndrome  - Status post PPM 12/12 -stable    TODAY-DAY OF DISCHARGE:  Subjective:   Nikan Ellingson today has no headache,no chest abdominal pain,no new weakness tingling or numbness, feels much better wants to go home today.   Objective:   Blood pressure 91/50, pulse 70, temperature 98.3 F (36.8 C), temperature source Oral, resp. rate 16, height 5\' 5"  (1.651 m), weight 50.2 kg (110 lb 10.7 oz), SpO2 95.00%.  Intake/Output Summary (Last 24 hours) at 12/24/12 1142 Last data filed at 12/24/12 0018  Gross per 24 hour  Intake 669.33 ml  Output    200 ml  Net 469.33 ml    Exam Awake Alert, Oriented *3, No new F.N deficits, Normal affect [place].AT,PERRAL Supple Neck,No JVD, No cervical lymphadenopathy appriciated.  Symmetrical Chest wall movement, Good air movement bilaterally, CTAB RRR,No Gallops,Rubs or new Murmurs, No Parasternal Heave +ve B.Sounds, Abd Soft, Non tender, No organomegaly appriciated, No rebound -guarding or rigidity. No Cyanosis, Clubbing or edema, No new Rash or bruise  DISCHARGE CONDITION: Stable  DISPOSITION: HOME  DISCHARGE INSTRUCTIONS:    Activity:  As tolerated   Diet recommendation: Heart Healthy diet      Follow-up Information   Follow up with Rene Paci, MD. Schedule an appointment as soon as  possible for a visit in 2 days.   Contact information:   [address] [place] [address] [phone]         Total Time spent on discharge equals 45 minutes.  SignedJeoffrey Massed 12/24/2012 11:42 AM

## 2012-12-24 NOTE — Progress Notes (Signed)
NURSING PROGRESS NOTE  Shaun James [id number] Discharge Data: 12/24/2012 1:39 PM Attending Provider: No att. providers found WUJ:WJXBJYN [name], MD     Claudean Severance to be D/C'd Home per MD order.  Discussed with the patient the After Visit Summary and all questions fully answered. All IV's discontinued with no bleeding noted. All belongings returned to patient for patient to take home.   Last Vital Signs:  Blood pressure 91/50, pulse 70, temperature 98.3 F (36.8 C), temperature source Oral, resp. rate 16, height 5\' 5"  (1.651 m), weight 50.2 kg (110 lb 10.7 oz), SpO2 95.00%.  Discharge Medication List   Medication List    TAKE these medications       ACETAMIN PO  Take 1 capsule by mouth daily as needed (for headache).     aspirin EC 81 MG tablet  Take 81 mg by mouth daily.     celecoxib 200 MG capsule  Commonly known as:  CELEBREX  Take 200 mg by mouth daily as needed for pain.     ciprofloxacin 500 MG tablet  Commonly known as:  CIPRO  Take 1 tablet (500 mg total) by mouth 2 (two) times daily.     CO Q 10 PO  Take 1 tablet by mouth daily.     diltiazem 240 MG 24 hr tablet  Commonly known as:  CARDIZEM LA  Take 240 mg by mouth daily.     fish oil-omega-3 fatty acids 1000 MG capsule  Take 1 g by mouth daily.     GINSENG PO  Take 1 capsule by mouth 2 (two) times daily.     nitroGLYCERIN 0.4 MG SL tablet  Commonly known as:  NITROSTAT  Place 0.4 mg under the tongue every 5 (five) minutes as needed for chest pain.     ranolazine 500 MG 12 hr tablet  Commonly known as:  RANEXA  Take 500 mg by mouth 2 (two) times daily.     saccharomyces boulardii 250 MG capsule  Commonly known as:  FLORASTOR  Take 1 capsule (250 mg total) by mouth 2 (two) times daily.     simvastatin 40 MG tablet  Commonly known as:  ZOCOR  Take 40 mg by mouth at bedtime.     tamsulosin 0.4 MG Caps  Commonly known as:  FLOMAX  Take 0.4 mg by mouth every evening.     valsartan 320 MG tablet   Commonly known as:  DIOVAN  Take 106.6 mg by mouth daily. 1/3 tablet     vitamin E 400 UNIT capsule  Take 400 Units by mouth daily.         Madelin Rear RN, MSN, [employer]

## 2012-12-25 ENCOUNTER — Ambulatory Visit: Payer: Medicare Other | Admitting: Internal Medicine

## 2012-12-25 DIAGNOSIS — Z0289 Encounter for other administrative examinations: Secondary | ICD-10-CM

## 2012-12-25 MED ORDER — SULFAMETHOXAZOLE-TRIMETHOPRIM 800-160 MG PO TABS: 1.0000 | ORAL_TABLET | Freq: Two times a day (BID) | ORAL | Status: DC

## 2012-12-26 ENCOUNTER — Telehealth: Payer: Self-pay | Admitting: General Practice

## 2012-12-26 NOTE — Telephone Encounter (Signed)
Transitional care call:  Discharged: 4/13 - Diagnosis: Nausea and vomiting and acute prostatitis/UTI -  Spoke with patient and he denies any questions regarding D/C instructions.  RN reviewed medication list with patient and patient states that he has all medications in the home.  Pt denies any questions about medications.  Patient does state that he has been having some dizziness but it is not affecting his gait. PT denies nausea or vomiting and states that appetite is returning.  Patient has f/u appointment with Dr. [name] on Thursday, 4/17 and will need follow up urine culture and possibly antibiotic adjustment pending urine culture sensitivities.

## 2012-12-28 ENCOUNTER — Encounter: Payer: Self-pay | Admitting: Internal Medicine

## 2012-12-28 ENCOUNTER — Ambulatory Visit (INDEPENDENT_AMBULATORY_CARE_PROVIDER_SITE_OTHER): Payer: 59 | Admitting: Internal Medicine

## 2012-12-28 VITALS — BP 100/58 | HR 69 | Temp 97.3°F | Wt 133.8 lb

## 2012-12-28 DIAGNOSIS — R112 Nausea with vomiting, unspecified: Secondary | ICD-10-CM

## 2012-12-28 DIAGNOSIS — N410 Acute prostatitis: Secondary | ICD-10-CM

## 2012-12-28 DIAGNOSIS — I2510 Atherosclerotic heart disease of native coronary artery without angina pectoris: Secondary | ICD-10-CM

## 2012-12-28 MED ORDER — CIPROFLOXACIN HCL 500 MG PO TABS: 500.0000 mg | ORAL_TABLET | Freq: Two times a day (BID) | ORAL | Status: DC

## 2012-12-28 NOTE — Assessment & Plan Note (Signed)
No evidence for GI or cardiac issue Symptoms appear related to GU infection Continue treatment for infection as above, symptomatic relief with Zofran as needed

## 2012-12-28 NOTE — Assessment & Plan Note (Signed)
No overt anginal symptoms - reviewed med hx Follows with [facility] for same Shaun James) Recent stress test fall 2013 WNL -advise followup with cardiology as planned The current medical regimen is effective;  continue present plan and medications.

## 2012-12-28 NOTE — Assessment & Plan Note (Signed)
Urgent care visit for same symptoms April 10 reviewed -empirically begin on Cipro, Because of persisting nausea and vomiting associated with same, patient hospitalized April 11-13 -discharge summary reviewed Urine culture during hospitalization April 11 showed no growth, April 10 urine culture with Escherichia coli resistant to fluoroquinolones Subsequently, patient instructed to discontinue Cipro and begin sulfa antibiotic No culture also sensitive to penicillins but patient with allergy to same Since antibiotic change, patient reports increasing prostatitis symptoms and recurrent nausea/vomiting -therefore believes sulfa antibiotic not as effective as Cipro Reviewed sensitivities in depth, patient remains convinced Cipro more effective Therefore I have instructed patient to take both antibiotics: continue sulfa antibiotic as added April 14 and resume Cipro Continue treatment for 30 day course Patient to call if recurrent problems prior to completion of antibiotic course Patient understands and agrees

## 2012-12-28 NOTE — Patient Instructions (Signed)
It was good to see you today. We have reviewed your hospital and urgent care visit records including labs and tests today Ok to resume Cipro antibody 2x/day - take all until gone Take Cipro in addition to Sulfa antibiotics as prescribed - both antibiotics will help prostate symptoms Continue Zofran as needed for nausea symptoms follow up with Dr Katrinka Blazing as discussed No other medication changes recommended today Followup here as scheduled for routine visit, call sooner if problems

## 2012-12-28 NOTE — Progress Notes (Signed)
Subjective:    Patient ID: Shaun James, male    DOB: 30-Nov-1930, 79 y.o.   MRN: [id number]  HPI   Here for hospital follow up - 4/1-4/13 with N/V and acute prostatitis - tx with IVF and Cipro + zofran - felt improved After DC home, called by UC 4/14 where intial dx made/UCx done 4/10 due to cx results indicating FQ resistant Ecoli - changed to Septra - began same 2 days ago Pt feels sulfa making him "sick" and not working as well as cipro  Also reviewed chronic medical issues today: Coronary artery disease. History of MI status post CABG in 1997. Last stress Myoview may 2012 without ischemia. Follows regularly with cardiology. Reviewed med management as ongoing, reports compliance as prescribed  Syncope with history sick sinus syndrome. Status post permanent pacemaker for same December 2012 -no recurrent dizziness or syncope events  osteoarthritis - B hands, hips and hx L shoulder RTC problems - denies recent injury or joint swelling. Uses Celebrex as needed with good relief  Past Medical History  Diagnosis Date  . Hypertension   . Coronary artery disease     s/p CABG '98  . Myocardial infarction   . Atrial flutter   . SSS (sick sinus syndrome)     syncope, s/p ppm 12/12  . Arthritis   . Allergic rhinitis   . Osteoarthritis 06/14/2012  . Prostatitis dx 12/2102    E coli Ucx    Review of Systems  Constitutional: Negative for fever or weight change.  complains of fatigue Respiratory: Negative for cough and shortness of breath.   Cardiovascular: Negative for chest pain or palpitations.  GI: complains of recurrent nausea in past 2 days since change of antibiotic from Cipro to sulfa, symptoms relieved with Zofran. No vomiting. No abdominal pain or bowel change Musculoskeletal: complains of bilateral lower back ache in past 2 days,  No injury or overuse. No joint swelling. No myalgia or arthralgia See history of present illness above; complete review of systems otherwise reviewed and  negative     Objective:   Physical Exam  BP 100/58  Pulse 69  Temp(Src) 97.3 F (36.3 C) (Oral)  Wt 133 lb 12.8 oz (60.691 kg)  BMI 22.27 kg/m2  SpO2 95% Wt Readings from Last 3 Encounters:  12/28/12 133 lb 12.8 oz (60.691 kg)  12/22/12 110 lb 10.7 oz (50.2 kg)  12/21/12 135 lb (61.236 kg)   Constitutional:  He appears well-developed and well-nourished. No distress. Very hard of hearing, bilateral hearing aids in place Neck: Normal range of motion. Neck supple. No JVD present. No thyromegaly present.  Cardiovascular: Normal rate, regular rhythm and normal heart sounds.  No murmur heard. no BLE edema Pulmonary/Chest: Effort normal and breath sounds normal. No respiratory distress. no wheezes. Abdomen: soft, nontender, nondistended. Bowel sounds present throughout. No mass. No rebound or guarding GU: Deferred at patient preference  Musculoskeletal: Normal range of motion. Patient exhibits no gross deformity.  Neurological: he is alert and oriented to person, place, and time. No cranial nerve deficit. Coordination, gait, speech, strength, balance and recall are normal.  Skin: Skin is warm and dry.  No erythema or ulceration.  Psychiatric: he has a normal mood and affect. behavior is normal. Judgment and thought content normal.   Lab Results  Component Value Date   WBC 14.2* 12/24/2012   HGB 11.9* 12/24/2012   HCT 33.3* 12/24/2012   PLT 202 12/24/2012   GLUCOSE 108* 12/24/2012   CHOL 149 06/14/2012  TRIG 199.0* 06/14/2012   HDL 42.90 06/14/2012   LDLCALC 66 06/14/2012   ALT 12 11/21/2012   AST 19 11/21/2012   NA 136 12/24/2012   K 3.7 12/24/2012   CL 103 12/24/2012   CREATININE 0.84 12/24/2012   BUN 9 12/24/2012   CO2 25 12/24/2012   TSH 3.36 11/21/2012   PSA 4.59* 12/21/2012   INR 1.19 08/27/2011       Assessment & Plan:   Transitional care visit within 7 days of hospital discharge Nursing phone note reviewed  See problem list. Medications and labs reviewed today.

## 2013-01-01 ENCOUNTER — Telehealth: Payer: Self-pay | Admitting: Internal Medicine

## 2013-01-01 NOTE — Telephone Encounter (Signed)
[facility] Triage Call Report Triage Record Num: [id number] Operator: Ether Griffins Patient Name: Shaun James Call Date & Time: 12/28/2012 8:16:00PM Patient Phone: [phone] PCP: Rene Paci Patient Gender: Male PCP Fax : [phone] Patient DOB: September 20, 1930 Practice Name: [facility] Reason for Call: Caller: Terry/Daughter; PCP: Rene Paci (Adults only); CB#: [phone]; Calling about severe dizziness and weakness,shuffling when walking,unsteady on feet,poor appetite after starting Cipro again today. Onset 12/28/12. Was d/c'd from hospital on 12/24/12 for prostatitis and severe weakness after starting Cipro. Guideline: Weakness or Paralysis. Disposition: Activate EMS 911. Reason for Disposition: New or worsening signs and symptoms that may indicate shock. Advised to hang up and call 911. Protocol(s) Used: Weakness or Paralysis Recommended Outcome per Protocol: Activate EMS 911 Reason for Outcome: New or worsening signs and symptoms that may indicate shock

## 2013-01-01 NOTE — Telephone Encounter (Signed)
Noted and agree with advice. 

## 2013-01-11 ENCOUNTER — Telehealth: Payer: Self-pay

## 2013-01-11 NOTE — Telephone Encounter (Signed)
There is no alternative antibiotic that can be used - based on his allergies and culture sensitivities, Septra is the only effective antibiotic he can take I recommend continuing Zofran for treatment of continued nausea, may refill if needed Reminded patient to take antibiotics with food If symptoms worse or unimproved, office visit to reevaluate other possible causes of nausea. Thanks

## 2013-01-11 NOTE — Telephone Encounter (Signed)
Pt's spouse called stating that ABX for prostatitis is causing severe nausea. Pt is requesting alternative medication, please advise

## 2013-01-11 NOTE — Telephone Encounter (Signed)
Pt's spouse advised in detail and expressed understanding.

## 2013-01-22 DIAGNOSIS — I2510 Atherosclerotic heart disease of native coronary artery without angina pectoris: Secondary | ICD-10-CM

## 2013-01-22 DIAGNOSIS — N410 Acute prostatitis: Secondary | ICD-10-CM

## 2013-01-22 DIAGNOSIS — R112 Nausea with vomiting, unspecified: Secondary | ICD-10-CM

## 2013-01-31 ENCOUNTER — Ambulatory Visit: Payer: Medicare Other

## 2013-01-31 ENCOUNTER — Ambulatory Visit (INDEPENDENT_AMBULATORY_CARE_PROVIDER_SITE_OTHER): Payer: Medicare Other | Admitting: Emergency Medicine

## 2013-01-31 VITALS — BP 100/50 | HR 73 | Temp 97.7°F | Resp 16 | Ht 64.5 in | Wt 128.0 lb

## 2013-01-31 DIAGNOSIS — M542 Cervicalgia: Secondary | ICD-10-CM

## 2013-01-31 DIAGNOSIS — S139XXA Sprain of joints and ligaments of unspecified parts of neck, initial encounter: Secondary | ICD-10-CM

## 2013-01-31 MED ORDER — METHOCARBAMOL 500 MG PO TABS: 500.0000 mg | ORAL_TABLET | Freq: Four times a day (QID) | ORAL | Status: DC

## 2013-01-31 NOTE — Patient Instructions (Addendum)
Cervical Sprain A cervical sprain is when the ligaments in the neck stretch or tear. The ligaments are the tissues that hold the neck bones in place. HOME CARE   Put ice on the injured area.  Put ice in a plastic bag.  Place a towel between your skin and the bag.  Leave the ice on for 15-20 minutes, 3-4 times a day.  Only take medicine as told by your doctor.  Keep all doctor visits as told.  Keep all physical therapy visits as told.  If your doctor gives you a neck collar, wear it as told.  Do not drive while wearing a neck collar.  Adjust your work station so that you have good posture while you work.  Avoid positions and activities that make your problems worse.  Warm up and stretch before being active. GET HELP RIGHT AWAY IF:   You are bleeding or your stomach is upset.  You have an allergic reaction to your medicine.  Your problems (symptoms) get worse.  You develop new problems.  You lose feeling (numbness) or you cannot move (paralysis) any part of your body.  You have tingling or weakness in any part of your body.  Your pain is not controlled with medicine.  You cannot take less pain medicine over time as planned.  Your activity level does not improve as expected. MAKE SURE YOU:   Understand these instructions.  Will watch your condition.  Will get help right away if you are not doing well or get worse. Document Released: 02/16/2008 Document Revised: 11/22/2011 Document Reviewed: 06/03/2011 [employer] Patient Information 2014 [employer].  
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
## 2013-02-14 ENCOUNTER — Ambulatory Visit (INDEPENDENT_AMBULATORY_CARE_PROVIDER_SITE_OTHER): Payer: 59 | Admitting: Internal Medicine

## 2013-02-14 ENCOUNTER — Encounter: Payer: Self-pay | Admitting: Internal Medicine

## 2013-02-14 VITALS — BP 122/68 | HR 63 | Temp 97.4°F | Wt 128.8 lb

## 2013-02-14 DIAGNOSIS — M25511 Pain in right shoulder: Secondary | ICD-10-CM

## 2013-02-14 DIAGNOSIS — M25519 Pain in unspecified shoulder: Secondary | ICD-10-CM

## 2013-02-14 DIAGNOSIS — M62838 Other muscle spasm: Secondary | ICD-10-CM

## 2013-02-14 MED ORDER — TRAMADOL HCL 50 MG PO TABS: 50.0000 mg | ORAL_TABLET | Freq: Three times a day (TID) | ORAL | Status: DC | PRN

## 2013-02-14 MED ORDER — TIZANIDINE HCL 4 MG PO TABS: 4.0000 mg | ORAL_TABLET | Freq: Four times a day (QID) | ORAL | Status: DC | PRN

## 2013-02-14 NOTE — Patient Instructions (Signed)
It was good to see you today. We have reviewed your prior records including labs and tests today Change to different muscle relaxer - zanaflex every 6 hours as needed for muscle spasm and pain Also use tramadol as needed for pain unrelieved with muscle relaxer  we'll make referral to physical therapy for your neck and shoulder pain. Our office will contact you regarding appointment(s) once made.
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
## 2013-06-09 ENCOUNTER — Other Ambulatory Visit: Payer: Self-pay | Admitting: Interventional Cardiology

## 2013-06-09 DIAGNOSIS — Z79899 Other long term (current) drug therapy: Secondary | ICD-10-CM

## 2013-06-09 DIAGNOSIS — E782 Mixed hyperlipidemia: Secondary | ICD-10-CM

## 2013-06-18 ENCOUNTER — Other Ambulatory Visit (INDEPENDENT_AMBULATORY_CARE_PROVIDER_SITE_OTHER): Payer: 59

## 2013-06-18 DIAGNOSIS — E782 Mixed hyperlipidemia: Secondary | ICD-10-CM

## 2013-06-18 DIAGNOSIS — Z79899 Other long term (current) drug therapy: Secondary | ICD-10-CM

## 2013-06-18 LAB — LIPID PANEL
HDL: 44.2 mg/dL (ref >39.00)
LDL Cholesterol: 70 mg/dL (ref 0–99)
VLDL: 35 mg/dL (ref 0.0–40.0)

## 2013-06-18 LAB — ALT: ALT: 15 U/L (ref 0–53)

## 2013-06-20 ENCOUNTER — Telehealth: Payer: Self-pay

## 2013-06-20 NOTE — Telephone Encounter (Signed)
Message copied by Jarvis Newcomer on Wed Jun 20, 2013  8:39 AM ------      Message from: Verdis Prime      Created: Tue Jun 19, 2013  6:12 PM       At goal. Repeat in 1 year. ------

## 2013-06-20 NOTE — Telephone Encounter (Signed)
pt made aware of labs At goal. Repeat in 1 year.pt verbalized understanding. anf rqst paper copy of labs to be mailed

## 2013-07-04 ENCOUNTER — Other Ambulatory Visit: Payer: Self-pay | Admitting: Interventional Cardiology

## 2013-07-15 ENCOUNTER — Other Ambulatory Visit: Payer: Self-pay | Admitting: *Deleted

## 2013-07-15 DIAGNOSIS — Z79899 Other long term (current) drug therapy: Secondary | ICD-10-CM

## 2013-07-15 DIAGNOSIS — I4891 Unspecified atrial fibrillation: Secondary | ICD-10-CM

## 2013-07-17 DIAGNOSIS — Z0271 Encounter for disability determination: Secondary | ICD-10-CM

## 2013-07-31 ENCOUNTER — Ambulatory Visit (INDEPENDENT_AMBULATORY_CARE_PROVIDER_SITE_OTHER): Payer: 59 | Admitting: Internal Medicine

## 2013-07-31 ENCOUNTER — Encounter: Payer: Self-pay | Admitting: Internal Medicine

## 2013-07-31 VITALS — BP 156/100 | HR 57 | Temp 97.1°F | Wt 135.8 lb

## 2013-07-31 DIAGNOSIS — N390 Urinary tract infection, site not specified: Secondary | ICD-10-CM

## 2013-07-31 DIAGNOSIS — R300 Dysuria: Secondary | ICD-10-CM

## 2013-07-31 NOTE — Progress Notes (Signed)
Subjective:    Patient ID: Shaun James, male    DOB: 1931/09/12, 79 y.o.   MRN: [id number]  HPI Shaun James presents for a several day h/o dysuria, cloudy urine, strong odor, post void dribble, mild low back pain, no lower abdomen pain. He has a h/o prostate infection.  Dip UA mild leuk, no blood, normal pH.  Past Medical History  Diagnosis Date  . Hypertension   . Coronary artery disease     s/p CABG '98  . Myocardial infarction   . Atrial flutter   . SSS (sick sinus syndrome)     syncope, s/p ppm 12/12  . Arthritis   . Allergic rhinitis   . Osteoarthritis 06/14/2012  . Prostatitis dx 12/2102    E coli Ucx   Past Surgical History  Procedure Laterality Date  . Coronary artery bypass graft  1998  . Insert / replace / remove pacemaker  08/27/2011  . Hemorrhoid surgery  2012  . Cataracts      bilateral  . Cardiac catheterization  2002  . Left rotator cuff surgery  2000   Family History  Problem Relation Age of Onset  . Coronary artery disease    . Other Father 65    [place]  . Heart attack Mother 24  . COPD Brother   . Hypertension Brother   . Heart disease Brother    History   Social History  . Marital Status: Married    Spouse Name: N/A    Number of Children: 4  . Years of Education: N/A   Occupational History  . retired     retired Acupuncturist   Social History Main Topics  . Smoking status: Former Smoker -- 0.50 packs/day for 10 years    Types: Cigarettes    Quit date: 09/13/1965  . Smokeless tobacco: Never Used  . Alcohol Use: No  . Drug Use: No  . Sexual Activity: Not Currently   Other Topics Concern  . Not on file   Social History Narrative   Moved from [place] in 1958    Current Outpatient Prescriptions on File Prior to Visit  Medication Sig Dispense Refill  . Acetaminophen (ACETAMIN PO) Take 1 capsule by mouth daily as needed (for headache).      . Coenzyme Q10 (CO Q 10 PO) Take 1 tablet by mouth daily.        Marland Kitchen diltiazem (CARDIZEM  LA) 240 MG 24 hr tablet Take 240 mg by mouth daily.      . fish oil-omega-3 fatty acids 1000 MG capsule Take 1 g by mouth daily.        . nitroGLYCERIN (NITROSTAT) 0.4 MG SL tablet Place 0.4 mg under the tongue every 5 (five) minutes as needed for chest pain.       . ranolazine (RANEXA) 500 MG 12 hr tablet Take 500 mg by mouth 2 (two) times daily.        Marland Kitchen saccharomyces boulardii (FLORASTOR) 250 MG capsule Take 1 capsule (250 mg total) by mouth 2 (two) times daily.  30 capsule  0  . simvastatin (ZOCOR) 20 MG tablet TAKE 1 TABLET EVERY EVENING  90 tablet  1  . simvastatin (ZOCOR) 40 MG tablet Take 40 mg by mouth at bedtime.       . Tamsulosin HCl (FLOMAX) 0.4 MG CAPS Take 0.4 mg by mouth every evening.       . valsartan (DIOVAN) 320 MG tablet Take 106.6 mg by mouth daily. 1/3  tablet      . vitamin E 400 UNIT capsule Take 400 Units by mouth daily.       No current facility-administered medications on file prior to visit.       Review of Systems System review is negative for any constitutional, cardiac, pulmonary, GI or neuro symptoms or complaints other than as described in the HPI.      Objective:   Physical Exam Filed Vitals:   07/31/13 1708  BP: 156/100  Pulse: 57  Temp: 97.1 F (36.2 C)   gen'l- elderly man in no acute distress Cor- RRR PUlm - normal respirations Abd - mild tenderness to percussion over the flanks, tender left abd at level of umbilicus. No suprapubic tenderness.          Assessment & Plan:  Urinary tract infection - Presentation does NOT suggest prostatitis. reviewed culture results from April - E. Coli ss to ceftriaxone, nitrufurantoin, ampicillin.  Plan    Hydrate  Cipro twice a day x 5 days. May use the supply you have at home.  Call if you do not see your urine clear.

## 2013-07-31 NOTE — Patient Instructions (Signed)
Urinary tract infection - Presentation does NOT suggest prostatitis. reviewed culture results from April - E. Coli ss to ceftriaxone, nitrufurantoin, ampicillin.  Plan    Hydrate  Cipro twice a day x 5 days. May use the supply you have at home.  Call if you do not see your urine clear.

## 2013-07-31 NOTE — Progress Notes (Signed)
Pre visit review using our clinic review tool, if applicable. No additional management support is needed unless otherwise documented below in the visit note. 

## 2013-08-01 LAB — POCT URINALYSIS DIPSTICK
Nitrite, UA: NEGATIVE
Protein, UA: NEGATIVE

## 2013-08-16 ENCOUNTER — Other Ambulatory Visit: Payer: 59

## 2013-08-20 ENCOUNTER — Encounter: Payer: Self-pay | Admitting: Interventional Cardiology

## 2013-08-30 ENCOUNTER — Other Ambulatory Visit (INDEPENDENT_AMBULATORY_CARE_PROVIDER_SITE_OTHER): Payer: 59

## 2013-08-30 ENCOUNTER — Ambulatory Visit (INDEPENDENT_AMBULATORY_CARE_PROVIDER_SITE_OTHER): Payer: 59 | Admitting: Internal Medicine

## 2013-08-30 ENCOUNTER — Encounter: Payer: Self-pay | Admitting: Internal Medicine

## 2013-08-30 VITALS — BP 120/82 | HR 65 | Temp 97.0°F | Wt 135.0 lb

## 2013-08-30 DIAGNOSIS — I2510 Atherosclerotic heart disease of native coronary artery without angina pectoris: Secondary | ICD-10-CM

## 2013-08-30 DIAGNOSIS — E785 Hyperlipidemia, unspecified: Secondary | ICD-10-CM

## 2013-08-30 DIAGNOSIS — N410 Acute prostatitis: Secondary | ICD-10-CM

## 2013-08-30 DIAGNOSIS — N411 Chronic prostatitis: Secondary | ICD-10-CM

## 2013-08-30 LAB — CBC WITH DIFFERENTIAL/PLATELET
Basophils Absolute: 0 10*3/uL (ref 0.0–0.1)
Eosinophils Absolute: 0.2 10*3/uL (ref 0.0–0.7)
Lymphocytes Relative: 23.4 % (ref 12.0–46.0)
MCHC: 33.8 g/dL (ref 30.0–36.0)
Neutrophils Relative %: 63.1 % (ref 43.0–77.0)
Platelets: 214 10*3/uL (ref 150.0–400.0)
RDW: 13.2 % (ref 11.5–14.6)
WBC: 6.3 10*3/uL (ref 4.5–10.5)

## 2013-08-30 LAB — URINALYSIS, ROUTINE W REFLEX MICROSCOPIC
Ketones, ur: NEGATIVE
Leukocytes, UA: NEGATIVE
Nitrite: NEGATIVE
RBC / HPF: NONE SEEN (ref 0–?)
Specific Gravity, Urine: >=1.03 — AB (ref 1.000–1.030)
Total Protein, Urine: NEGATIVE
pH: 6 (ref 5.0–8.0)

## 2013-08-30 LAB — BASIC METABOLIC PANEL
Calcium: 9.4 mg/dL (ref 8.4–10.5)
Chloride: 106 mEq/L (ref 96–112)
GFR: 81.99 mL/min (ref >60.00)
Glucose, Bld: 106 mg/dL — ABNORMAL HIGH (ref 70–99)
Potassium: 4.3 mEq/L (ref 3.5–5.1)
Sodium: 140 mEq/L (ref 135–145)

## 2013-08-30 MED ORDER — OFLOXACIN 200 MG PO TABS: 200.0000 mg | ORAL_TABLET | Freq: Two times a day (BID) | ORAL | Status: DC

## 2013-08-30 NOTE — Patient Instructions (Signed)
It was good to see you today.  We have reviewed your prior records including labs and tests today  Test(s) ordered today. Your results will be released to MyChart (or called to you) after review, usually within 72hours after test completion. If any changes need to be made, you will be notified at that same time.  Medications reviewed and updated Start ofloxacin antibiotics 2x/day x 6 weeks for prostate infection and pain symptoms - Your prescription(s) have been submitted to your pharmacy. Please take as directed and contact our office if you believe you are having problem(s) with the medication(s).  we'll make referral to urology for prostate exam and treatment. Our office will contact you regarding appointment(s) once made.  follow up with your cardiologist as planned

## 2013-08-30 NOTE — Progress Notes (Signed)
Pre-visit discussion using our clinic review tool. No additional management support is needed unless otherwise documented below in the visit note.  

## 2013-08-30 NOTE — Assessment & Plan Note (Signed)
No overt anginal symptoms - reviewed med hx Follows with cards for same Shaun James) Last stress test fall 2013 WNL -advised followup with cardiology as planned The current medical regimen is effective;  continue present plan and medications.

## 2013-08-30 NOTE — Assessment & Plan Note (Signed)
On statin Last lipids reviewed The current medical regimen is effective;  continue present plan and medications.  

## 2013-08-30 NOTE — Progress Notes (Signed)
Subjective:    Patient ID: Shaun James, male    DOB: 07-20-1931, 79 y.o.   MRN: [id number]  Back Pain Pertinent negatives include no chest pain or weakness.    Feels similar to prior prostate infection symptoms (per pt)  reviewed hospital stay 4/1-4/13 with N/V and acute prostatitis - tx with IVF and Cipro + zofran - felt improved After DC home, called by UC 4/14 where intial dx made/UCx done 4/10 due to cx results indicating FQ resistant Ecoli - changed to Septra, but pt stopped dsme because he felt sulfa making him "sick" and not working as well as cipro  Also reviewed chronic medical issues today: Coronary artery disease. History of MI status post CABG in 1997. Last stress Myoview may 2012 without ischemia. Follows regularly with cardiology. Reviewed med management as ongoing, reports compliance as prescribed  Syncope with history sick sinus syndrome. Status post permanent pacemaker for same December 2012 -no recurrent dizziness or syncope events  osteoarthritis - B hands, hips and hx L shoulder RTC problems - denies recent injury or joint swelling. Uses Celebrex as needed with good relief  Past Medical History  Diagnosis Date  . Hypertension   . Coronary artery disease     s/p CABG '98  . Myocardial infarction   . Atrial flutter   . SSS (sick sinus syndrome)     syncope, s/p ppm 12/12  . Arthritis   . Allergic rhinitis   . Osteoarthritis 06/14/2012  . Prostatitis dx 12/2102    E coli Ucx     Review of Systems  Constitutional: Positive for fatigue. Negative for unexpected weight change.  Respiratory: Negative for cough and shortness of breath.   Cardiovascular: Negative for chest pain and leg swelling.  Endocrine: Negative for polydipsia and polyuria.  Genitourinary: Positive for flank pain. Negative for hematuria, discharge and testicular pain. Decreased urine volume: ?  Musculoskeletal: Positive for back pain. Negative for gait problem, joint swelling and myalgias.    Neurological: Negative for weakness.       Objective:   Physical Exam BP 120/82  Pulse 65  Temp(Src) 97 F (36.1 C) (Oral)  Wt 135 lb (61.236 kg)  SpO2 95% Wt Readings from Last 3 Encounters:  08/30/13 135 lb (61.236 kg)  07/31/13 135 lb 12.8 oz (61.598 kg)  02/14/13 128 lb 12.8 oz (58.423 kg)   Constitutional: he appears well-developed and well-nourished. No distress.  Cardiovascular: Normal rate, irregular rhythm and normal heart sounds.  No murmur heard. No BLE edema. Pulmonary/Chest: Effort normal and breath sounds normal. No respiratory distress. he has no wheezes.  Abdominal: Soft. Bowel sounds are normal. he exhibits no distension. There is no tenderness. no masses GU: defer at pt request MSkel: Back: full range of motion of thoracic and lumbar spine. Non tender to palpation. Negative straight leg raise. DTR's are symmetrically intact. Sensation intact in all dermatomes of the lower extremities. Full strength to manual muscle testing. patient is able to heel toe walk without difficulty and ambulates with antalgic gait. Skin: Skin is warm and dry. No rash noted. No erythema.  Psychiatric: he has a normal mood and affect. behavior is normal. Judgment and thought content normal.   Lab Results  Component Value Date   WBC 14.2* 12/24/2012   HGB 11.9* 12/24/2012   HCT 33.3* 12/24/2012   PLT 202 12/24/2012   GLUCOSE 108* 12/24/2012   CHOL 149 06/18/2013   TRIG 175.0* 06/18/2013   HDL 44.20 06/18/2013   LDLCALC  70 06/18/2013   ALT 15 06/18/2013   AST 19 11/21/2012   NA 136 12/24/2012   K 3.7 12/24/2012   CL 103 12/24/2012   CREATININE 0.84 12/24/2012   BUN 9 12/24/2012   CO2 25 12/24/2012   TSH 3.36 11/21/2012   PSA 4.59* 12/21/2012   INR 1.19 08/27/2011       Assessment & Plan:    Recurrent prostatitis symptoms - hx same with hosp 12/2011 reviewed  Will check UA, Ucx and PSA, CBC, BMet  Start empiric abx and refer to uro

## 2013-09-03 ENCOUNTER — Ambulatory Visit (INDEPENDENT_AMBULATORY_CARE_PROVIDER_SITE_OTHER): Payer: 59 | Admitting: Interventional Cardiology

## 2013-09-03 ENCOUNTER — Encounter: Payer: Self-pay | Admitting: Interventional Cardiology

## 2013-09-03 VITALS — BP 120/78 | HR 61 | Ht 64.0 in | Wt 137.0 lb

## 2013-09-03 DIAGNOSIS — I219 Acute myocardial infarction, unspecified: Secondary | ICD-10-CM

## 2013-09-03 DIAGNOSIS — I2510 Atherosclerotic heart disease of native coronary artery without angina pectoris: Secondary | ICD-10-CM

## 2013-09-03 DIAGNOSIS — R42 Dizziness and giddiness: Secondary | ICD-10-CM

## 2013-09-03 DIAGNOSIS — I4892 Unspecified atrial flutter: Secondary | ICD-10-CM

## 2013-09-03 DIAGNOSIS — E785 Hyperlipidemia, unspecified: Secondary | ICD-10-CM

## 2013-09-03 DIAGNOSIS — Z950 Presence of cardiac pacemaker: Secondary | ICD-10-CM

## 2013-09-03 MED ORDER — VALSARTAN 320 MG PO TABS: 160.0000 mg | ORAL_TABLET | Freq: Every day | ORAL | Status: DC

## 2013-09-03 NOTE — Patient Instructions (Addendum)
Your physician has recommended you make the following change in your medication: Decrease (Diovan)Valasrtan to 160mg  daily 1/2tablet.  Please schedule a appointment for a device check asap.  Continue all other medications as prescribed.  Monitor you blood pressure at home daily.  Your physician wants you to follow-up in: 6 months You will receive a reminder letter in the mail two months in advance. If you don't receive a letter, please call our office to schedule the follow-up appointment.

## 2013-09-03 NOTE — Progress Notes (Signed)
Patient ID: Shaun James, male   DOB: 04-16-1931, 79 y.o.   MRN: [id number] Past Medical History  ASHD status post CABG in 27 (LIMA to LAD, SVG to diagonal-50% closed on catheterization in 99, SVG to OM1, SVG to PDA), repeat cath 2009 with patent grafts   Hypercholesterolemia   Benign positional vertigo   GERD, PUD   Presbycusis with bilateral hearing aids   Diverticulosis   DJD   Urinary frequency and nocturia   Remote history of tuberculosis   Calcification in his liver on CT scan   Intermittent confusion   Colon polyps   Hypertension   Mouth burning   3.1 cm AAA, reevaluated December, 2010 per ultrasound - stable   skin lesion mid back, melanotic in appearance, punch biopsy on August 28, 2009, 5 x 5 mm, and ulceration   Probable aortic sclerosis on physical exam, 2010   severe internal hemorrhoids, surgery July 31, 2010 per Dr. Apolonio Schneiders [facility]   BPH   Atrial fibrillation onm       [address] [place], [place]  [address] Phone: [phone] Fax:  [phone]  Date:  09/03/2013   ID:  Shaun James, DOB 06/14/1931, MRN [id number]  PCP:  Rene Paci, MD   ASSESSMENT:  1. Dizziness 2. Coronary atherosclerotic heart disease, with coronary bypass grafting in 1997 including LIMA to LAD. In 2009 grafts are noted to be patent 3. History of atrial fib 4. chronic anticoagulation therapy 5. Hypertension   PLAN:  1. Operating on the hypothesis that he may have relatively low blood pressure, I will decrease valsartan to 160 mg daily 2. He should call back in mid-January with a report of whether the feeling of dizziness has improved. 3. He should monitor blood pressure on the lower dose of valsartan and call if he consistently runs above 140/90 mmHg 4. In January we will determine if any adjustments in the medical regimen need to be made. If not we will send him a prescription for valsartan 160 instead of the 320 mg  tablets 5. Otherwise clinical followup in 6-9 months 6. He missed his pacemaker visit and will therefore return tomorrow for evaluation   SUBJECTIVE: Shaun James is a 79 y.o. male who has no specific cardiac complaints other than feeling somewhat dizzy. He wonders if it could be related to his blood pressure. He exercises every other day and has no dizziness with exercise. He denies chest pain. No orthopnea or PND. He has not needed nitroglycerin. He denies leg pain consistent with claudication. He has not had prolonged palpitations. His appetite is been stable.   Wt Readings from Last 3 Encounters:  09/03/13 137 lb (62.143 kg)  08/30/13 135 lb (61.236 kg)  07/31/13 135 lb 12.8 oz (61.598 kg)     Past Medical History  Diagnosis Date  . Hypertension   . Coronary artery disease     s/p CABG '98  . Myocardial infarction   . Atrial flutter   . SSS (sick sinus syndrome)     syncope, s/p ppm 12/12  . Arthritis   . Allergic rhinitis   . Osteoarthritis 06/14/2012  . Prostatitis dx 12/2102    E coli Ucx    Current Outpatient Prescriptions  Medication Sig Dispense Refill  . Acetaminophen (ACETAMIN PO) Take 1 capsule by mouth daily as needed (for headache).      . Apixaban (ELIQUIS PO) Take by mouth.      Marland Kitchen  Coenzyme Q10 (CO Q 10 PO) Take 1 tablet by mouth daily.        Marland Kitchen diltiazem (CARDIZEM LA) 240 MG 24 hr tablet Take 240 mg by mouth daily.      . fish oil-omega-3 fatty acids 1000 MG capsule Take 1 g by mouth daily.        . nitroGLYCERIN (NITROSTAT) 0.4 MG SL tablet Place 0.4 mg under the tongue every 5 (five) minutes as needed for chest pain.       Marland Kitchen ofloxacin (FLOXIN) 200 MG tablet Take 1 tablet (200 mg total) by mouth 2 (two) times daily.  84 tablet  0  . ranolazine (RANEXA) 500 MG 12 hr tablet Take 500 mg by mouth 2 (two) times daily.        Marland Kitchen saccharomyces boulardii (FLORASTOR) 250 MG capsule Take 1 capsule (250 mg total) by mouth 2 (two) times daily.  30 capsule  0  . simvastatin  (ZOCOR) 20 MG tablet TAKE 1 TABLET EVERY EVENING  90 tablet  1  . Tamsulosin HCl (FLOMAX) 0.4 MG CAPS Take 0.4 mg by mouth every evening.       . valsartan (DIOVAN) 320 MG tablet Take 106.6 mg by mouth daily. 1/3 tablet      . vitamin E 400 UNIT capsule Take 400 Units by mouth daily.       No current facility-administered medications for this visit.    Allergies:    Allergies  Allergen Reactions  . Zyrtec [Cetirizine Hcl] Other (See Comments)    Unknown   . Penicillins Rash    Social History:  The patient  reports that he quit smoking about 48 years ago. His smoking use included Cigarettes. He has a 5 pack-year smoking history. He has never used smokeless tobacco. He reports that he does not drink alcohol or use illicit drugs.   ROS:  Please see the history of present illness.   No transient neurological complaints.   All other systems reviewed and negative.   OBJECTIVE: VS:  BP 120/78  Pulse 61  Ht 5\' 4"  (1.626 m)  Wt 137 lb (62.143 kg)  BMI 23.50 kg/m2 Well nourished, well developed, in no acute distress, healthy HEENT: normal Neck: JVD flat. Carotid bruit absent  Cardiac:  normal S1, S2; RRR; no murmur Lungs:  clear to auscultation bilaterally, no wheezing, rhonchi or rales Abd: soft, nontender, no hepatomegaly Ext: Edema absent. Pulses 2+ Skin: warm and dry Neuro:  CNs 2-12 intact, no focal abnormalities noted  EKG:  First-degree AV block, otherwise normal.       Signed, Darci Needle III, MD 09/03/2013 10:06 AM

## 2013-09-04 ENCOUNTER — Encounter: Payer: Self-pay | Admitting: Internal Medicine

## 2013-09-04 ENCOUNTER — Ambulatory Visit (INDEPENDENT_AMBULATORY_CARE_PROVIDER_SITE_OTHER): Payer: 59 | Admitting: *Deleted

## 2013-09-04 DIAGNOSIS — I495 Sick sinus syndrome: Secondary | ICD-10-CM

## 2013-09-04 LAB — MDC_IDC_ENUM_SESS_TYPE_INCLINIC
Lead Channel Impedance Value: 604 Ohm
Lead Channel Impedance Value: 798 Ohm
Lead Channel Pacing Threshold Amplitude: 0.9 V
Lead Channel Pacing Threshold Pulse Width: 0.4 ms
Lead Channel Sensing Intrinsic Amplitude: 3.9 mV

## 2013-09-04 NOTE — Progress Notes (Signed)
Pacemaker check in clinic. Normal device function. Thresholds, sensing, impedances consistent with previous measurements. Device programmed to maximize longevity. 6 mode switches, <1%, + eliquis.  No high ventricular rates noted. Device programmed at appropriate safety margins. Histogram distribution appropriate for patient activity level. Device programmed to optimize intrinsic conduction. Estimated longevity 8 years. Patient enrolled in remote follow-up/TTM's with [employer]. Plan to follow every 3 months remotely and see annually in office. Patient education completed.  ROV in March with Dr. Ladona Ridgel.

## 2013-09-26 ENCOUNTER — Ambulatory Visit: Payer: 59 | Admitting: Interventional Cardiology

## 2013-10-01 ENCOUNTER — Telehealth: Payer: Self-pay

## 2013-10-01 NOTE — Telephone Encounter (Signed)
called and spoke with pt caregiver.pt scheduled for an appt with Dr.Smith tomorrow 10/02/13 for 58mo f/u. appt cancelled pt seen by Dr.Smith 08/2013 and needs a 77mo f/u.pt to be seen 02/2014.recall in epic

## 2013-10-02 ENCOUNTER — Ambulatory Visit: Payer: Medicare Other | Admitting: Interventional Cardiology

## 2013-10-09 ENCOUNTER — Other Ambulatory Visit: Payer: Self-pay | Admitting: Interventional Cardiology

## 2013-12-06 ENCOUNTER — Ambulatory Visit (INDEPENDENT_AMBULATORY_CARE_PROVIDER_SITE_OTHER): Payer: Medicare Other | Admitting: Internal Medicine

## 2013-12-06 ENCOUNTER — Encounter: Payer: Self-pay | Admitting: Internal Medicine

## 2013-12-06 VITALS — BP 113/69 | HR 69 | Ht 64.0 in | Wt 138.0 lb

## 2013-12-06 DIAGNOSIS — I4892 Unspecified atrial flutter: Secondary | ICD-10-CM

## 2013-12-06 DIAGNOSIS — Z950 Presence of cardiac pacemaker: Secondary | ICD-10-CM

## 2013-12-06 DIAGNOSIS — I495 Sick sinus syndrome: Secondary | ICD-10-CM

## 2013-12-06 DIAGNOSIS — I219 Acute myocardial infarction, unspecified: Secondary | ICD-10-CM

## 2013-12-06 LAB — MDC_IDC_ENUM_SESS_TYPE_INCLINIC
Brady Statistic RA Percent Paced: 4 %
Brady Statistic RV Percent Paced: 12 %
Date Time Interrogation Session: 20150326040000
Lead Channel Impedance Value: 805 Ohm
Lead Channel Pacing Threshold Amplitude: 0.6 V
Lead Channel Pacing Threshold Amplitude: 1 V
Lead Channel Pacing Threshold Pulse Width: 0.4 ms
Lead Channel Setting Pacing Amplitude: 2 V
Lead Channel Setting Sensing Sensitivity: 2.5 mV
MDC IDC MSMT LEADCHNL RA IMPEDANCE VALUE: 627 Ohm
MDC IDC MSMT LEADCHNL RA SENSING INTR AMPL: 4 mV
MDC IDC MSMT LEADCHNL RV PACING THRESHOLD PULSEWIDTH: 0.4 ms
MDC IDC MSMT LEADCHNL RV SENSING INTR AMPL: 25 mV — AB
MDC IDC PG SERIAL: 118262
MDC IDC SET LEADCHNL RV PACING AMPLITUDE: 1.1 V
MDC IDC SET LEADCHNL RV PACING PULSEWIDTH: 0.4 ms
Zone Setting Detection Interval: 375 ms

## 2013-12-06 NOTE — Progress Notes (Signed)
HPI Shaun James is referred to me today for ongoing evaluation and management of his DDD PM. He is a very Copy with a h/o CAD, PAF, and symptomatic bradycardia who underwent PPM insertion 2.5 years ago. In the interim, he has been stable. He denies chest pain or sob. No peripheral edema. No syncope.  Allergies  Allergen Reactions  . Penicillins Rash  . Zyrtec [Cetirizine Hcl] Rash          Current Outpatient Prescriptions  Medication Sig Dispense Refill  . Acetaminophen (ACETAMIN PO) Take 1 capsule by mouth daily as needed (for headache).      . Apixaban (ELIQUIS PO) Take by mouth.      . Coenzyme Q10 (CO Q 10 PO) Take 1 tablet by mouth daily.        Marland Kitchen diltiazem (CARDIZEM LA) 240 MG 24 hr tablet Take 240 mg by mouth daily.      . fish oil-omega-3 fatty acids 1000 MG capsule Take 1 g by mouth daily.        . nitroGLYCERIN (NITROSTAT) 0.4 MG SL tablet Place 0.4 mg under the tongue every 5 (five) minutes as needed for chest pain.       Marland Kitchen ofloxacin (FLOXIN) 200 MG tablet Take 1 tablet (200 mg total) by mouth 2 (two) times daily.  84 tablet  0  . RANEXA 500 MG 12 hr tablet take 1 tablet by mouth twice a day  60 tablet  5  . saccharomyces boulardii (FLORASTOR) 250 MG capsule Take 1 capsule (250 mg total) by mouth 2 (two) times daily.  30 capsule  0  . simvastatin (ZOCOR) 20 MG tablet TAKE 1 TABLET EVERY EVENING  90 tablet  1  . Tamsulosin HCl (FLOMAX) 0.4 MG CAPS Take 0.4 mg by mouth every evening.       . valsartan (DIOVAN) 320 MG tablet Take 0.5 tablets (160 mg total) by mouth daily.      . vitamin E 400 UNIT capsule Take 400 Units by mouth daily.       No current facility-administered medications for this visit.     Past Medical History  Diagnosis Date  . Coronary artery disease     s/p CABG '98  . Myocardial infarction   . Atrial flutter   . SSS (sick sinus syndrome)     syncope, s/p ppm 12/12  . Arthritis   . Allergic rhinitis   . Osteoarthritis  06/14/2012  . Prostatitis dx 12/2102    E coli Ucx  . Inflamed seborrheic keratosis   . Basal cell carcinoma   . Encounter for long-term (current) use of other medications   . Personal history of colonic polyps   . Essential hypertension, benign   . Coronary atherosclerosis of native coronary artery   . Esophageal reflux   . Peptic ulcer, unspecified site, unspecified as acute or chronic, without mention of hemorrhage, perforation, or obstruction   . Diverticulosis of colon (without mention of hemorrhage)   . Mixed hyperlipidemia   . Near syncope     uncertain cause. R/O arrhythmia, R/O med effect  . Cardiac pacemaker in situ   . Systolic murmur     Worrisome for AS. AS could cause exertional fatigue.  . Atrial fibrillation   . Long term (current) use of anticoagulants   . Benign positional vertigo   . GERD (gastroesophageal reflux disease)   . Intermittent confusion   . Presbycusis of both ears  Bilateral hearing aids  . DJD (degenerative joint disease)   . History of tuberculosis     remote Hx  . Mouth burn   . BPH (benign prostatic hyperplasia)   . Internal hemorrhoids     severe  . Aortic sclerosis     Probable AS on physical exam, 2010  . Urinary frequency   . Nocturia   . AAA (abdominal aortic aneurysm)     3.1 cm AAA, reevaluated 08/2009 per ultrasound - stable    ROS:   All systems reviewed and negative except as noted in the HPI.   Past Surgical History  Procedure Laterality Date  . Hemorrhoid surgery  07/31/2010  . Cataracts      bilateral  . Cardiac catheterization  2002  . Left rotator cuff surgery  2000  . Coronary artery bypass graft  1997    (LIMA to LAD, SVG to diagonal-50% closed on catheterization in 1999, SVG to OM1, SVG to PDA) Repeat cath 2009 with patent grafts  . Excision of tongue lesion    . Punch biopsy of skin  08/2009    5 mm punch biopsy on upper mid back melanotic appearing lesion  . Insert / replace / remove pacemaker   08/27/2011    PPM implant     Family History  Problem Relation Age of Onset  . Coronary artery disease    . Other Father 60    [place]  . Heart attack Mother 27  . Heart disease Mother     ASHD  . COPD Brother   . Hypertension Brother   . Heart disease Brother     ASHD  . Heart disease Brother     ASHD  . Heart disease Sister     ASHD  . COPD Brother      History   Social History  . Marital Status: Married    Spouse Name: N/A    Number of Children: 4  . Years of Education: N/A   Occupational History  . retired     retired Art gallery manager   Social History Main Topics  . Smoking status: Former Smoker -- 0.50 packs/day for 15 years    Types: Cigarettes    Quit date: 09/13/1960  . Smokeless tobacco: Never Used  . Alcohol Use: No  . Drug Use: No  . Sexual Activity: Not Currently   Other Topics Concern  . Not on file   Social History Narrative   Moved from [place] in 1958     BP 113/69  Pulse 69  Ht 5\' 4"  (1.626 m)  Wt 138 lb (62.596 kg)  BMI 23.68 kg/m2  Physical Exam:  Well appearing elderly man, NAD HEENT: Unremarkable Neck:  No JVD, no thyromegally Back:  No CVA tenderness Lungs:  Clear with no wheezes HEART:  Regular rate rhythm, no murmurs, no rubs, no clicks Abd:  soft, positive bowel sounds, no organomegally, no rebound, no guarding Ext:  2 plus pulses, no edema, no cyanosis, no clubbing Skin:  No rashes no nodules Neuro:  CN II through XII intact, motor grossly intact   DEVICE  Normal device function.  See PaceArt for details.   Assess/Plan:

## 2013-12-06 NOTE — Patient Instructions (Signed)
Your physician wants you to follow-up in: 12 months with Dr Knox Saliva will receive a reminder letter in the mail two months in advance. If you don't receive a letter, please call our office to schedule the follow-up appointment.     Remote monitoring is used to monitor your Pacemaker of ICD from home. This monitoring reduces the number of office visits required to check your device to one time per year. It allows [place] to keep an eye on the functioning of your device to ensure it is working properly. You are scheduled for a device check from home on 03/14/14. You may send your transmission at any time that day. If you have a wireless device, the transmission will be sent automatically. After your physician reviews your transmission, you will receive a postcard with your next transmission date.

## 2013-12-06 NOTE — Assessment & Plan Note (Signed)
Interogation of his [place] [facility] DDD PPM demonstrates normal device function. He will recheck in several months.

## 2013-12-06 NOTE — Assessment & Plan Note (Signed)
He denies anginal symptoms. He will continue his current meds and followup with Dr. Tamala Julian.

## 2013-12-11 ENCOUNTER — Encounter: Payer: Self-pay | Admitting: Internal Medicine

## 2013-12-31 ENCOUNTER — Other Ambulatory Visit: Payer: Self-pay | Admitting: Interventional Cardiology

## 2014-01-09 ENCOUNTER — Other Ambulatory Visit: Payer: Self-pay | Admitting: Interventional Cardiology

## 2014-01-09 NOTE — Telephone Encounter (Signed)
Is this the correct dose? Thanks, MI

## 2014-02-28 ENCOUNTER — Ambulatory Visit (INDEPENDENT_AMBULATORY_CARE_PROVIDER_SITE_OTHER): Payer: 59 | Admitting: Internal Medicine

## 2014-02-28 ENCOUNTER — Encounter: Payer: Self-pay | Admitting: Internal Medicine

## 2014-02-28 ENCOUNTER — Other Ambulatory Visit (INDEPENDENT_AMBULATORY_CARE_PROVIDER_SITE_OTHER): Payer: 59

## 2014-02-28 VITALS — BP 120/80 | HR 65 | Temp 97.4°F | Wt 136.1 lb

## 2014-02-28 DIAGNOSIS — R5383 Other fatigue: Secondary | ICD-10-CM

## 2014-02-28 DIAGNOSIS — Z0000 Encounter for general adult medical examination without abnormal findings: Secondary | ICD-10-CM

## 2014-02-28 DIAGNOSIS — R5381 Other malaise: Secondary | ICD-10-CM

## 2014-02-28 DIAGNOSIS — I2510 Atherosclerotic heart disease of native coronary artery without angina pectoris: Secondary | ICD-10-CM

## 2014-02-28 LAB — CBC WITH DIFFERENTIAL/PLATELET
BASOS PCT: 0.4 % (ref 0.0–3.0)
Basophils Absolute: 0 10*3/uL (ref 0.0–0.1)
Eosinophils Absolute: 0.2 10*3/uL (ref 0.0–0.7)
Eosinophils Relative: 2.5 % (ref 0.0–5.0)
HCT: 42.2 % (ref 39.0–52.0)
Hemoglobin: 14.3 g/dL (ref 13.0–17.0)
LYMPHS PCT: 24.5 % (ref 12.0–46.0)
Lymphs Abs: 1.6 10*3/uL (ref 0.7–4.0)
MCHC: 33.9 g/dL (ref 30.0–36.0)
MCV: 92.8 fl (ref 78.0–100.0)
MONOS PCT: 9.3 % (ref 3.0–12.0)
Monocytes Absolute: 0.6 10*3/uL (ref 0.1–1.0)
Neutro Abs: 4.2 10*3/uL (ref 1.4–7.7)
Neutrophils Relative %: 63.3 % (ref 43.0–77.0)
Platelets: 243 10*3/uL (ref 150.0–400.0)
RBC: 4.55 Mil/uL (ref 4.22–5.81)
RDW: 13.1 % (ref 11.5–15.5)
WBC: 6.6 10*3/uL (ref 4.0–10.5)

## 2014-02-28 LAB — BASIC METABOLIC PANEL
BUN: 13 mg/dL (ref 6–23)
CALCIUM: 9.1 mg/dL (ref 8.4–10.5)
CHLORIDE: 105 meq/L (ref 96–112)
CO2: 27 mEq/L (ref 19–32)
CREATININE: 1 mg/dL (ref 0.4–1.5)
GFR: 78.05 mL/min (ref >60.00)
Glucose, Bld: 125 mg/dL — ABNORMAL HIGH (ref 70–99)
Potassium: 4.2 mEq/L (ref 3.5–5.1)
Sodium: 138 mEq/L (ref 135–145)

## 2014-02-28 LAB — URINALYSIS, ROUTINE W REFLEX MICROSCOPIC
Bilirubin Urine: NEGATIVE
HGB URINE DIPSTICK: NEGATIVE
Ketones, ur: NEGATIVE
LEUKOCYTES UA: NEGATIVE
Nitrite: NEGATIVE
RBC / HPF: NONE SEEN (ref 0–?)
SPECIFIC GRAVITY, URINE: 1.025 (ref 1.000–1.030)
Total Protein, Urine: NEGATIVE
UROBILINOGEN UA: 0.2 (ref 0.0–1.0)
Urine Glucose: NEGATIVE
WBC UA: NONE SEEN (ref 0–?)
pH: 6.5 (ref 5.0–8.0)

## 2014-02-28 LAB — HEPATIC FUNCTION PANEL
ALK PHOS: 58 U/L (ref 39–117)
ALT: 18 U/L (ref 0–53)
AST: 23 U/L (ref 0–37)
Albumin: 4.2 g/dL (ref 3.5–5.2)
BILIRUBIN DIRECT: 0.1 mg/dL (ref 0.0–0.3)
TOTAL PROTEIN: 7.1 g/dL (ref 6.0–8.3)
Total Bilirubin: 0.8 mg/dL (ref 0.2–1.2)

## 2014-02-28 LAB — LIPID PANEL
CHOL/HDL RATIO: 4
Cholesterol: 164 mg/dL (ref 0–200)
HDL: 45.1 mg/dL (ref >39.00)
LDL Cholesterol: 88 mg/dL (ref 0–99)
NONHDL: 118.9
Triglycerides: 155 mg/dL — ABNORMAL HIGH (ref 0.0–149.0)
VLDL: 31 mg/dL (ref 0.0–40.0)

## 2014-02-28 LAB — TSH: TSH: 3.44 u[IU]/mL (ref 0.35–4.50)

## 2014-02-28 NOTE — Assessment & Plan Note (Signed)
Appears to have icreasing anginal symptoms with treadmill exertion - nausea/pressure epigastric regio relieved with rest or NTG reviewed med hx Follows with cards for same Shaun James) Last stress test fall 2013 WNL -advised followup with cardiology as planned The current medical regimen is effective;  continue present plan and medications.

## 2014-02-28 NOTE — Patient Instructions (Addendum)
It was good to see you today.  We have reviewed your prior records including labs and tests today  Health Maintenance reviewed - all recommended immunizations and age-appropriate screenings are up-to-date.  Test(s) ordered today. Your results will be released to [facility] (or called to you) after review, usually within 72hours after test completion. If any changes need to be made, you will be notified at that same time.  Medications reviewed and updated, no changes recommended at this time.  Keep appointment with Dr. Tamala Julian as planned to discuss your "pressure" and nausea symptoms  Please schedule followup in 12 months for annual exam and labs, call sooner if problems.  Health Maintenance A healthy lifestyle and preventative care can promote health and wellness.  Maintain regular health, dental, and eye exams.  Eat a healthy diet. Foods like vegetables, fruits, whole grains, low-fat dairy products, and lean protein foods contain the nutrients you need and are low in calories. Decrease your intake of foods high in solid fats, added sugars, and salt. Get information about a proper diet from your health care provider, if necessary.  Regular physical exercise is one of the most important things you can do for your health. Most adults should get at least 150 minutes of moderate-intensity exercise (any activity that increases your heart rate and causes you to sweat) each week. In addition, most adults need muscle-strengthening exercises on 2 or more days a week.   Maintain a healthy weight. The body mass index (BMI) is a screening tool to identify possible weight problems. It provides an estimate of body fat based on height and weight. Your health care provider can find your BMI and can help you achieve or maintain a healthy weight. For males 20 years and older:  A BMI below 18.5 is considered underweight.  A BMI of 18.5 to 24.9 is normal.  A BMI of 25 to 29.9 is considered overweight.  A BMI  of 30 and above is considered obese.  Maintain normal blood lipids and cholesterol by exercising and minimizing your intake of saturated fat. Eat a balanced diet with plenty of fruits and vegetables. Blood tests for lipids and cholesterol should begin at age 12 and be repeated every 5 years. If your lipid or cholesterol levels are high, you are over age 76, or you are at high risk for heart disease, you may need your cholesterol levels checked more frequently.Ongoing high lipid and cholesterol levels should be treated with medicines if diet and exercise are not working.  If you smoke, find out from your health care provider how to quit. If you do not use tobacco, do not start.  Lung cancer screening is recommended for adults aged 10-80 years who are at high risk for developing lung cancer because of a history of smoking. A yearly low-dose CT scan of the lungs is recommended for people who have at least a 30-pack-year history of smoking and are current smokers or have quit within the past 15 years. A pack year of smoking is smoking an average of 1 pack of cigarettes a day for 1 year (for example, a 30-pack-year history of smoking could mean smoking 1 pack a day for 30 years or 2 packs a day for 15 years). Yearly screening should continue until the smoker has stopped smoking for at least 15 years. Yearly screening should be stopped for people who develop a health problem that would prevent them from having lung cancer treatment.  If you choose to drink alcohol, do not  have more than 2 drinks per day. One drink is considered to be 12 oz (360 mL) of beer, 5 oz (150 mL) of wine, or 1.5 oz (45 mL) of liquor.  Avoid the use of street drugs. Do not share needles with anyone. Ask for help if you need support or instructions about stopping the use of drugs.  High blood pressure causes heart disease and increases the risk of stroke. Blood pressure should be checked at least every 1-2 years. Ongoing high blood  pressure should be treated with medicines if weight loss and exercise are not effective.  If you are 17-53 years old, ask your health care provider if you should take aspirin to prevent heart disease.  Diabetes screening involves taking a blood sample to check your fasting blood sugar level. This should be done once every 3 years after age 66 if you are at a normal weight and without risk factors for diabetes. Testing should be considered at a younger age or be carried out more frequently if you are overweight and have at least 1 risk factor for diabetes.  Colorectal cancer can be detected and often prevented. Most routine colorectal cancer screening begins at the age of 47 and continues through age 5. However, your health care provider may recommend screening at an earlier age if you have risk factors for colon cancer. On a yearly basis, your health care provider may provide home test kits to check for hidden blood in the stool. A small camera at the end of a tube may be used to directly examine the colon (sigmoidoscopy or colonoscopy) to detect the earliest forms of colorectal cancer. Talk to your health care provider about this at age 7 when routine screening begins. A direct exam of the colon should be repeated every 5-10 years through age 71, unless early forms of precancerous polyps or small growths are found.  People who are at an increased risk for hepatitis B should be screened for this virus. You are considered at high risk for hepatitis B if:  You were born in a country where hepatitis B occurs often. Talk with your health care provider about which countries are considered high risk.  Your parents were born in a high-risk country and you have not received a shot to protect against hepatitis B (hepatitis B vaccine).  You have HIV or AIDS.  You use needles to inject street drugs.  You live with, or have sex with, someone who has hepatitis B.  You are a man who has sex with other men  (MSM).  You get hemodialysis treatment.  You take certain medicines for conditions like cancer, organ transplantation, and autoimmune conditions.  Hepatitis C blood testing is recommended for all people born from [birth year] through 1963 and any individual with known risk factors for hepatitis C.  Healthy men should no longer receive prostate-specific antigen (PSA) blood tests as part of routine cancer screening. Talk to your health care provider about prostate cancer screening.  Testicular cancer screening is not recommended for adolescents or adult males who have no symptoms. Screening includes self-exam, a health care provider exam, and other screening tests. Consult with your health care provider about any symptoms you have or any concerns you have about testicular cancer.  Practice safe sex. Use condoms and avoid high-risk sexual practices to reduce the spread of sexually transmitted infections (STIs).  You should be screened for STIs, including gonorrhea and chlamydia if:  You are sexually active and are younger than 45  years.  Dennis Bast are older than 24 years, and your health care provider tells you that you are at risk for this type of infection.  Your sexual activity has changed since you were last screened, and you are at an increased risk for chlamydia or gonorrhea. Ask your health care provider if you are at risk.  If you are at risk of being infected with HIV, it is recommended that you take a prescription medicine daily to prevent HIV infection. This is called pre-exposure prophylaxis (PrEP). You are considered at risk if:  You are a man who has sex with other men (MSM).  You are a heterosexual man who is sexually active with multiple partners.  You take drugs by injection.  You are sexually active with a partner who has HIV.  Talk with your health care provider about whether you are at high risk of being infected with HIV. If you choose to begin PrEP, you should first be tested for  HIV. You should then be tested every 3 months for as long as you are taking PrEP.  Use sunscreen. Apply sunscreen liberally and repeatedly throughout the day. You should seek shade when your shadow is shorter than you. Protect yourself by wearing long sleeves, pants, a wide-brimmed hat, and sunglasses year round whenever you are outdoors.  Tell your health care provider of new moles or changes in moles, especially if there is a change in shape or color. Also, tell your health care provider if a mole is larger than the size of a pencil eraser.  A one-time screening for abdominal aortic aneurysm (AAA) and surgical repair of large AAAs by ultrasound is recommended for men aged 40-75 years who are current or former smokers.  Stay current with your vaccines (immunizations). Document Released: 02/26/2008 Document Revised: 09/04/2013 Document Reviewed: 01/25/2011 [facility] Patient Information 2015 [place], [place]. This information is not intended to replace advice given to you by your health care provider. Make sure you discuss any questions you have with your health care provider.

## 2014-02-28 NOTE — Progress Notes (Signed)
Subjective:    Patient ID: Shaun James, male    DOB: 08-04-1931, 80 y.o.   MRN: [id number]  HPI   Here for medicare wellness/annual physical  Diet: heart healthy  Physical activity: treadmill 30" 3-4/wk at home Depression/mood screen: negative Hearing: diminished - wears  B hearing aide and following with audiology ADLs: capable Fall risk: none Home safety: good Cognitive evaluation: intact to orientation, naming, recall and repetition EOL planning: adv directives, full code/ I agree  I have personally reviewed and have noted 1. The patient's medical and social history 2. Their use of alcohol, tobacco or illicit drugs 3. Their current medications and supplements 4. The patient's functional ability including ADL's, fall risks, home safety risks and hearing or visual impairment. 5. Diet and physical activities 6. Evidence for depression or mood disorders  Also reviewed chronic medical issues and interval medical events  Past Medical History  Diagnosis Date  . Coronary artery disease     s/p CABG '98  . Myocardial infarction   . Atrial flutter   . SSS (sick sinus syndrome)     syncope, s/p ppm 12/12  . Arthritis   . Allergic rhinitis   . Osteoarthritis 06/14/2012  . Prostatitis dx 12/2102    E coli Ucx  . Inflamed seborrheic keratosis   . Basal cell carcinoma   . Encounter for long-term (current) use of other medications   . Personal history of colonic polyps   . Essential hypertension, benign   . Coronary atherosclerosis of native coronary artery   . Esophageal reflux   . Peptic ulcer, unspecified site, unspecified as acute or chronic, without mention of hemorrhage, perforation, or obstruction   . Diverticulosis of colon (without mention of hemorrhage)   . Mixed hyperlipidemia   . Near syncope     uncertain cause. R/O arrhythmia, R/O med effect  . Cardiac pacemaker in situ   . Systolic murmur     Worrisome for AS. AS could cause exertional fatigue.  . Atrial  fibrillation   . Long term (current) use of anticoagulants   . Benign positional vertigo   . GERD (gastroesophageal reflux disease)   . Intermittent confusion   . Presbycusis of both ears     Bilateral hearing aids  . DJD (degenerative joint disease)   . History of tuberculosis     remote Hx  . Mouth burn   . BPH (benign prostatic hyperplasia)   . Internal hemorrhoids     severe  . Aortic sclerosis     Probable AS on physical exam, 2010  . Urinary frequency   . Nocturia   . AAA (abdominal aortic aneurysm)     3.1 cm AAA, reevaluated 08/2009 per ultrasound - stable   Family History  Problem Relation Age of Onset  . Coronary artery disease    . Other Father 42    [place]  . Heart attack Mother 7  . Heart disease Mother     ASHD  . COPD Brother   . Hypertension Brother   . Heart disease Brother     ASHD  . Heart disease Brother     ASHD  . Heart disease Sister     ASHD  . COPD Brother    History  Substance Use Topics  . Smoking status: Former Smoker -- 0.50 packs/day for 15 years    Types: Cigarettes    Quit date: 09/13/1960  . Smokeless tobacco: Never Used  . Alcohol Use: No  Review of Systems  Constitutional: Positive for fatigue. Negative for fever, activity change, appetite change and unexpected weight change.  HENT: Positive for hearing loss (B, chronic w/o change).   Respiratory: Negative for cough, chest tightness, shortness of breath and wheezing.   Cardiovascular: Negative for chest pain, palpitations and leg swelling.  Gastrointestinal: Positive for nausea ("pressure" in epigastric region when on treadmill, relieved with rest or NTG). Negative for vomiting, abdominal pain, diarrhea and constipation.  Musculoskeletal: Positive for arthralgias (diffuse). Negative for joint swelling.  Neurological: Positive for headaches (chronic, relieved with Tylenol). Negative for dizziness, weakness and light-headedness.  Psychiatric/Behavioral: Negative for  dysphoric mood. The patient is not nervous/anxious.   All other systems reviewed and are negative.      Objective:   Physical Exam  BP 120/80  Pulse 65  Temp(Src) 97.4 F (36.3 C) (Oral)  Wt 136 lb 1.9 oz (61.744 kg)  SpO2 95% Wt Readings from Last 3 Encounters:  02/28/14 136 lb 1.9 oz (61.744 kg)  12/06/13 138 lb (62.596 kg)  09/03/13 137 lb (62.143 kg)   Constitutional: he appears well-developed and well-nourished. No distress.  HENT: Head: Normocephalic and atraumatic. Ears: HOH - wears B hearing aides; Nose: Nose normal. Mouth/Throat: Oropharynx is clear and moist. No oropharyngeal exudate.  Eyes: Conjunctivae and EOM are normal. Pupils are equal, round, and reactive to light. No scleral icterus.  Neck: Normal range of motion. Neck supple. No JVD present. No thyromegaly present.  Cardiovascular: Normal rate, regular rhythm and normal heart sounds.  No murmur heard. No BLE edema. Pulmonary/Chest: Effort normal and breath sounds normal. No respiratory distress. he has no wheezes.  Abdominal: Soft. Bowel sounds are normal. he exhibits no distension. There is no tenderness. no masses GU: defer to uro Musculoskeletal: Normal range of motion, no joint effusions. No gross deformities Neurological: he is alert and oriented to person, place, and time. No cranial nerve deficit. Coordination, balance, strength, speech and gait are normal.  Skin: Skin is warm and dry. No rash noted. No erythema.  Psychiatric: he has a normal mood and affect. behavior is normal. Judgment and thought content normal.   Lab Results  Component Value Date   WBC 6.3 08/30/2013   HGB 14.5 08/30/2013   HCT 42.9 08/30/2013   PLT 214.0 08/30/2013   GLUCOSE 106* 08/30/2013   CHOL 149 06/18/2013   TRIG 175.0* 06/18/2013   HDL 44.20 06/18/2013   LDLCALC 70 06/18/2013   ALT 15 06/18/2013   AST 19 11/21/2012   NA 140 08/30/2013   K 4.3 08/30/2013   CL 106 08/30/2013   CREATININE 0.9 08/30/2013   BUN 16 08/30/2013    CO2 31 08/30/2013   TSH 3.36 11/21/2012   PSA 3.40 08/30/2013   INR 1.19 08/27/2011    No results found.     Assessment & Plan:   V70.0/CPX/AWV - Today patient counseled on age appropriate routine health concerns for screening and prevention, each reviewed and up to date or declined. Immunizations reviewed and up to date or declined. Labs odered and reviewed. Risk factors for depression reviewed and negative. Hearing function and visual acuity are stable/addressed. ADLs screened and addressed as needed. Functional ability and level of safety reviewed and appropriate. Education, counseling and referrals performed based on assessed risks today. Patient provided with a copy of personalized plan for preventive services.  Fatigue - nonspecific symptoms/exam - check screening labs  Problem List Items Addressed This Visit   CAD (coronary artery disease)  Appears to have icreasing anginal symptoms with treadmill exertion - nausea/pressure epigastric regio relieved with rest or NTG reviewed med hx Follows with cards for same Tamala Julian) Last stress test fall 2013 WNL -advised followup with cardiology as planned The current medical regimen is effective;  continue present plan and medications.    Relevant Orders      Basic metabolic panel      CBC with Differential      Hepatic function panel      Lipid panel      TSH    Other Visit Diagnoses   Routine general medical examination at a health care facility    -  Primary    Relevant Orders       Basic metabolic panel       CBC with Differential       Hepatic function panel       Lipid panel       TSH       Urinalysis, Routine w reflex microscopic    Fatigue        Relevant Orders       Basic metabolic panel       CBC with Differential       Hepatic function panel       Lipid panel       TSH       Urinalysis, Routine w reflex microscopic

## 2014-02-28 NOTE — Progress Notes (Signed)
Pre visit review using our clinic review tool, if applicable. No additional management support is needed unless otherwise documented below in the visit note. 

## 2014-03-03 DIAGNOSIS — I209 Angina pectoris, unspecified: Secondary | ICD-10-CM | POA: Diagnosis not present

## 2014-03-06 ENCOUNTER — Encounter: Payer: Self-pay | Admitting: Interventional Cardiology

## 2014-03-06 ENCOUNTER — Ambulatory Visit (INDEPENDENT_AMBULATORY_CARE_PROVIDER_SITE_OTHER): Payer: 59 | Admitting: Interventional Cardiology

## 2014-03-06 VITALS — BP 97/64 | HR 70 | Ht 64.0 in | Wt 137.0 lb

## 2014-03-06 DIAGNOSIS — I209 Angina pectoris, unspecified: Secondary | ICD-10-CM

## 2014-03-06 DIAGNOSIS — I495 Sick sinus syndrome: Secondary | ICD-10-CM

## 2014-03-06 DIAGNOSIS — I25709 Atherosclerosis of coronary artery bypass graft(s), unspecified, with unspecified angina pectoris: Secondary | ICD-10-CM

## 2014-03-06 DIAGNOSIS — Z950 Presence of cardiac pacemaker: Secondary | ICD-10-CM

## 2014-03-06 DIAGNOSIS — I25810 Atherosclerosis of coronary artery bypass graft(s) without angina pectoris: Secondary | ICD-10-CM

## 2014-03-06 DIAGNOSIS — I483 Typical atrial flutter: Secondary | ICD-10-CM

## 2014-03-06 DIAGNOSIS — I4892 Unspecified atrial flutter: Secondary | ICD-10-CM

## 2014-03-06 NOTE — Patient Instructions (Signed)
Your physician recommends that you continue on your current medications as directed. Please refer to the Current Medication list given to you today.  Your physician has requested that you have a lexiscan myoview. For further information please visit [email or web address]. Please follow instruction sheet, as given.  Your physician wants you to follow-up in: 1 year You will receive a reminder letter in the mail two months in advance. If you don't receive a letter, please call our office to schedule the follow-up appointment.

## 2014-03-06 NOTE — Progress Notes (Signed)
Patient ID: MARKEVIUS TROMBETTA, male   DOB: 1930/11/05, 80 y.o.   MRN: [id number]    [address] [place], [place]  [address] Phone: [phone] Fax:  [phone]  Date:  03/06/2014   ID:  SHELDEN RABORN, DOB 1931-04-19, MRN [id number]  PCP:  Gwendolyn Grant, MD   ASSESSMENT:  1. Coronary artery disease with angina on during treadmill exercise despite medical therapy. The angina is relatively new over the past 2-3 months. 2. Sick sinus syndrome with permanent pacemaker 3. Chronic anticoagulation,, Eliquis 4. Hypertension 5. History of paroxysmal atrial fibrillation  PLAN:  1. Myocardial perfusion imaging with pharmacologic stress 2. No change in the medical regimen 3. Clinical followup in one year assuming a low risk myocardial perfusion study. If this study is low risk we will intensify the Ranexa   SUBJECTIVE: DAYSHON ROBACK is a 80 y.o. male 2-3 month history of exertional substernal chest tightness. It resolves with rest. It occasionally occurs at rest and nitroglycerin resolves the discomfort. Despite the presence of the mild discomfort, he is able to walk on a treadmill for up to 30 minutes. The discomfort begins approximately 5 minutes after starting exercise. He has never had a prolonged episode of discomfort after exercise or one that is not relieved by sublingual nitroglycerin.  No bleeding on anticoagulation.  No neurological complaints.   Wt Readings from Last 3 Encounters:  03/06/14 137 lb (62.143 kg)  02/28/14 136 lb 1.9 oz (61.744 kg)  12/06/13 138 lb (62.596 kg)     Past Medical History  Diagnosis Date  . Coronary artery disease     s/p CABG '98  . Myocardial infarction   . Atrial flutter   . SSS (sick sinus syndrome)     syncope, s/p ppm 12/12  . Arthritis   . Allergic rhinitis   . Osteoarthritis 06/14/2012  . Prostatitis dx 12/2102    E coli Ucx  . Inflamed seborrheic keratosis   . Basal cell carcinoma   . Encounter for long-term (current) use of  other medications   . Personal history of colonic polyps   . Essential hypertension, benign   . Coronary atherosclerosis of native coronary artery   . Esophageal reflux   . Peptic ulcer, unspecified site, unspecified as acute or chronic, without mention of hemorrhage, perforation, or obstruction   . Diverticulosis of colon (without mention of hemorrhage)   . Mixed hyperlipidemia   . Near syncope     uncertain cause. R/O arrhythmia, R/O med effect  . Cardiac pacemaker in situ   . Systolic murmur     Worrisome for AS. AS could cause exertional fatigue.  . Atrial fibrillation   . Long term (current) use of anticoagulants   . Benign positional vertigo   . GERD (gastroesophageal reflux disease)   . Intermittent confusion   . Presbycusis of both ears     Bilateral hearing aids  . DJD (degenerative joint disease)   . History of tuberculosis     remote Hx  . Mouth burn   . BPH (benign prostatic hyperplasia)   . Internal hemorrhoids     severe  . Aortic sclerosis     Probable AS on physical exam, 2010  . Urinary frequency   . Nocturia   . AAA (abdominal aortic aneurysm)     3.1 cm AAA, reevaluated 08/2009 per ultrasound - stable    Current Outpatient Prescriptions  Medication Sig Dispense Refill  . Acetaminophen (ACETAMIN PO) Take  1 capsule by mouth daily as needed (for headache).      . Apixaban (ELIQUIS PO) Take by mouth.      . Coenzyme Q10 (CO Q 10 PO) Take 1 tablet by mouth daily.        Marland Kitchen diltiazem (CARDIZEM LA) 240 MG 24 hr tablet Take 240 mg by mouth daily.      Marland Kitchen ELIQUIS 2.5 MG TABS tablet take 1 tablet by mouth twice a day  60 tablet  3  . fish oil-omega-3 fatty acids 1000 MG capsule Take 1 g by mouth daily.        . nitroGLYCERIN (NITROSTAT) 0.4 MG SL tablet Place 0.4 mg under the tongue every 5 (five) minutes as needed for chest pain.       Marland Kitchen ofloxacin (FLOXIN) 200 MG tablet Take 1 tablet (200 mg total) by mouth 2 (two) times daily.  84 tablet  0  . RANEXA 500 MG 12 hr  tablet take 1 tablet by mouth twice a day  60 tablet  5  . saccharomyces boulardii (FLORASTOR) 250 MG capsule Take 1 capsule (250 mg total) by mouth 2 (two) times daily.  30 capsule  0  . simvastatin (ZOCOR) 20 MG tablet TAKE 1 TABLET EVERY EVENING  90 tablet  0  . Tamsulosin HCl (FLOMAX) 0.4 MG CAPS Take 0.4 mg by mouth every evening.       . valsartan (DIOVAN) 320 MG tablet Take 0.5 tablets (160 mg total) by mouth daily.      . vitamin E 400 UNIT capsule Take 400 Units by mouth daily.       No current facility-administered medications for this visit.    Allergies:    Allergies  Allergen Reactions  . Penicillins Rash  . Zyrtec [Cetirizine Hcl] Rash         Social History:  The patient  reports that he quit smoking about 53 years ago. His smoking use included Cigarettes. He has a 7.5 pack-year smoking history. He has never used smokeless tobacco. He reports that he does not drink alcohol or use illicit drugs.   ROS:  Please see the history of present illness.   Feels dizzy all the time. This is been a chronic complaint   All other systems reviewed and negative.   OBJECTIVE: VS:  BP 97/64  Pulse 70  Ht 5\' 4"  (1.626 m)  Wt 137 lb (62.143 kg)  BMI 23.50 kg/m2 Well nourished, well developed, in no acute distress, elderly but fit-appearing HEENT: normal Neck: JVD flat. Carotid bruit absent  Cardiac:  normal S1, S2; RRR, 2/6 systolic murmur at left lower sternal border Lungs:  clear to auscultation bilaterally, no wheezing, rhonchi or rales Abd: soft, nontender, no hepatomegaly Ext: Edema absent. Pulses 2+ Skin: warm and dry Neuro:  CNs 2-12 intact, no focal abnormalities noted  EKG:  Not performed today       Signed, Illene Labrador III, MD 03/06/2014 10:53 AM

## 2014-03-08 ENCOUNTER — Other Ambulatory Visit: Payer: Self-pay | Admitting: Interventional Cardiology

## 2014-03-14 ENCOUNTER — Encounter: Payer: Medicare Other | Admitting: *Deleted

## 2014-03-14 ENCOUNTER — Ambulatory Visit (HOSPITAL_COMMUNITY): Payer: Medicare Other | Attending: Cardiology | Admitting: Radiology

## 2014-03-14 ENCOUNTER — Telehealth: Payer: Self-pay | Admitting: Cardiology

## 2014-03-14 VITALS — BP 133/74 | HR 54 | Ht 64.0 in | Wt 134.0 lb

## 2014-03-14 DIAGNOSIS — Z87891 Personal history of nicotine dependence: Secondary | ICD-10-CM | POA: Insufficient documentation

## 2014-03-14 DIAGNOSIS — I495 Sick sinus syndrome: Secondary | ICD-10-CM | POA: Insufficient documentation

## 2014-03-14 DIAGNOSIS — Z8249 Family history of ischemic heart disease and other diseases of the circulatory system: Secondary | ICD-10-CM | POA: Insufficient documentation

## 2014-03-14 DIAGNOSIS — I10 Essential (primary) hypertension: Secondary | ICD-10-CM | POA: Insufficient documentation

## 2014-03-14 DIAGNOSIS — I252 Old myocardial infarction: Secondary | ICD-10-CM | POA: Insufficient documentation

## 2014-03-14 DIAGNOSIS — I25709 Atherosclerosis of coronary artery bypass graft(s), unspecified, with unspecified angina pectoris: Secondary | ICD-10-CM

## 2014-03-14 DIAGNOSIS — I2510 Atherosclerotic heart disease of native coronary artery without angina pectoris: Secondary | ICD-10-CM | POA: Insufficient documentation

## 2014-03-14 DIAGNOSIS — R079 Chest pain, unspecified: Secondary | ICD-10-CM

## 2014-03-14 DIAGNOSIS — I739 Peripheral vascular disease, unspecified: Secondary | ICD-10-CM | POA: Insufficient documentation

## 2014-03-14 DIAGNOSIS — Z951 Presence of aortocoronary bypass graft: Secondary | ICD-10-CM | POA: Insufficient documentation

## 2014-03-14 DIAGNOSIS — I209 Angina pectoris, unspecified: Secondary | ICD-10-CM

## 2014-03-14 MED ORDER — TECHNETIUM TC 99M SESTAMIBI GENERIC - CARDIOLITE
10.8000 | Freq: Once | INTRAVENOUS | Status: AC | PRN
  Administered 2014-03-14: 11 via INTRAVENOUS

## 2014-03-14 MED ORDER — REGADENOSON 0.4 MG/5ML IV SOLN
0.4000 mg | Freq: Once | INTRAVENOUS | Status: AC
  Administered 2014-03-14: 0.4 mg via INTRAVENOUS

## 2014-03-14 MED ORDER — TECHNETIUM TC 99M SESTAMIBI GENERIC - CARDIOLITE
33.0000 | Freq: Once | INTRAVENOUS | Status: AC | PRN
  Administered 2014-03-14: 33 via INTRAVENOUS

## 2014-03-14 NOTE — Telephone Encounter (Signed)
Confirmed remote transmission from home with family member and pt.

## 2014-03-14 NOTE — Progress Notes (Signed)
[facility] [address] [place], [place] [address] [phone]    Cardiology Nuclear Med Study  Shaun James is a 80 y.o. male     MRN : [id number]     DOB: 13-Jan-1931  Procedure Date: 03/14/2014  Nuclear Med Background Indication for Stress Test:  Evaluation for Ischemia and Graft Patency History:  CAD, MI, Cath, CABG, PTVP (sick sinus syndrome), hx Afib, Echo 2014 EF 60-65%, MPI 2014 (scar) EF 73% Cardiac Risk Factors: Family History - CAD, History of Smoking, Hypertension and PVD  Symptoms:  Chest Pain   Nuclear Pre-Procedure Caffeine/Decaff Intake:  None> 12 hrs NPO After: 7:30pm   Lungs:  clear O2 Sat: 95% on room air. IV 0.9% NS with Angio Cath:  22g  IV Site: R Forearm x 1, tolerated well IV Started by:  Irven Baltimore, RN  Chest Size (in):  36 Cup Size: n/a  Height: 5\' 4"  (1.626 m)  Weight:  134 lb (60.782 kg)  BMI:  Body mass index is 22.99 kg/(m^2). Tech Comments:  No Medications today per patient. Irven Baltimore, RN.    Nuclear Med Study 1 or 2 day study: 1 day  Stress Test Type:  Carlton Adam  Reading MD: N/A  Order Authorizing Provider:  Daneen Schick, MD  Resting Radionuclide: Technetium 57m Sestamibi  Resting Radionuclide Dose: 11. mCi   Stress Radionuclide:  Technetium 76m Sestamibi  Stress Radionuclide Dose: 33.0 mCi           Stress Protocol Rest HR: 54 Stress HR: 72  Rest BP: 133/74 Stress BP: 122/70  Exercise Time (min): n/a METS: n/a           Dose of Adenosine (mg):  n/a Dose of Lexiscan: 0.4 mg  Dose of Atropine (mg): n/a Dose of Dobutamine: n/a mcg/kg/min (at max HR)  Stress Test Technologist: Glade Lloyd, BS-ES  Nuclear Technologist:  Charlton Amor, CNMT     Rest Procedure:  Myocardial perfusion imaging was performed at rest 45 minutes following the intravenous administration of Technetium 31m Sestamibi. Rest ECG: Sinus bradycardia rate 54 with nonspecific ST-T wave changes.  Stress Procedure:  The patient  received IV Lexiscan 0.4 mg over 15-seconds.  Technetium 12m Sestamibi injected at 30-seconds.  Quantitative spect images were obtained after a 45 minute delay. During the infusion of Lexiscan the patient complained of chest burning and nausea.  These symptoms began to resolve in recovery.  Stress ECG: No significant change from baseline ECG  QPS Raw Data Images:  Normal; no motion artifact; normal heart/lung ratio.  Stress Images:  There is subtle decreased uptake along the mid anteroseptal region, likely breast attenuation artifact seen at both rest and stress. There is no significant ischemia identified. Rest Images:  As above Subtraction (SDS):  No evidence of ischemia. Transient Ischemic Dilatation (Normal <1.22):  0.90 Lung/Heart Ratio (Normal <0.45):  0.26  Quantitative Gated Spect Images QGS EDV:  70 ml QGS ESV:  27 ml  Impression Exercise Capacity:  Lexiscan with no exercise. BP Response:  Normal blood pressure response. Clinical Symptoms:  Typical symptoms with pharmacologic study. ECG Impression:  No significant ST segment change suggestive of ischemia. Comparison with Prior Nuclear Study: No images to compare  Overall Impression:  Low risk stress nuclear study with no areas of significant ischemia identified.  LV Ejection Fraction: 61%.  LV Wall Motion:  There is septal wall hypokinesis consistent with post bypass changes.   Candee Furbish, MD

## 2014-03-18 ENCOUNTER — Encounter: Payer: Self-pay | Admitting: Cardiology

## 2014-03-20 ENCOUNTER — Telehealth: Payer: Self-pay

## 2014-03-20 NOTE — Telephone Encounter (Signed)
Message copied by Lamar Laundry on Wed Mar 20, 2014  3:29 PM ------      Message from: Daneen Schick      Created: Mon Mar 18, 2014  5:55 PM       Low risk study with no evidence of major abnormality. Continue med therapy and aerobic exercise as tolerated. ------

## 2014-03-20 NOTE — Telephone Encounter (Signed)
Pt aware of myoview results.Low risk study with no evidence of major abnormality. Continue med therapy and aerobic exercise as tolerated.pt rqst copy of report be mailed.done pt verbalized understanding.

## 2014-04-08 ENCOUNTER — Encounter: Payer: 59 | Admitting: Internal Medicine

## 2014-04-12 ENCOUNTER — Other Ambulatory Visit: Payer: Self-pay

## 2014-04-12 MED ORDER — RANOLAZINE ER 500 MG PO TB12: ORAL_TABLET | ORAL | Status: DC

## 2014-04-16 ENCOUNTER — Other Ambulatory Visit: Payer: Self-pay | Admitting: Interventional Cardiology

## 2014-04-30 ENCOUNTER — Encounter (HOSPITAL_COMMUNITY): Payer: Self-pay | Admitting: Emergency Medicine

## 2014-04-30 ENCOUNTER — Telehealth: Payer: Self-pay | Admitting: Nurse Practitioner

## 2014-04-30 ENCOUNTER — Emergency Department (HOSPITAL_COMMUNITY)
Admission: EM | Admit: 2014-04-30 | Discharge: 2014-04-30 | Disposition: A | Discharge status: Home / Self Care | Payer: Medicare Other | Attending: Emergency Medicine | Admitting: Emergency Medicine

## 2014-04-30 ENCOUNTER — Ambulatory Visit (INDEPENDENT_AMBULATORY_CARE_PROVIDER_SITE_OTHER): Payer: Medicare Other | Admitting: Nurse Practitioner

## 2014-04-30 ENCOUNTER — Encounter: Payer: Self-pay | Admitting: Nurse Practitioner

## 2014-04-30 ENCOUNTER — Other Ambulatory Visit (INDEPENDENT_AMBULATORY_CARE_PROVIDER_SITE_OTHER): Payer: Medicare Other

## 2014-04-30 ENCOUNTER — Emergency Department (HOSPITAL_COMMUNITY): Payer: Medicare Other

## 2014-04-30 VITALS — BP 118/68 | HR 76 | Temp 98.5°F | Ht 64.0 in | Wt 137.0 lb

## 2014-04-30 DIAGNOSIS — S3981XA Other specified injuries of abdomen, initial encounter: Secondary | ICD-10-CM | POA: Insufficient documentation

## 2014-04-30 DIAGNOSIS — R011 Cardiac murmur, unspecified: Secondary | ICD-10-CM | POA: Insufficient documentation

## 2014-04-30 DIAGNOSIS — Z85038 Personal history of other malignant neoplasm of large intestine: Secondary | ICD-10-CM | POA: Diagnosis not present

## 2014-04-30 DIAGNOSIS — J189 Pneumonia, unspecified organism: Secondary | ICD-10-CM | POA: Diagnosis not present

## 2014-04-30 DIAGNOSIS — Z9889 Other specified postprocedural states: Secondary | ICD-10-CM | POA: Insufficient documentation

## 2014-04-30 DIAGNOSIS — Y9389 Activity, other specified: Secondary | ICD-10-CM | POA: Insufficient documentation

## 2014-04-30 DIAGNOSIS — Z880 Allergy status to penicillin: Secondary | ICD-10-CM | POA: Insufficient documentation

## 2014-04-30 DIAGNOSIS — IMO0002 Reserved for concepts with insufficient information to code with codable children: Secondary | ICD-10-CM | POA: Insufficient documentation

## 2014-04-30 DIAGNOSIS — Z950 Presence of cardiac pacemaker: Secondary | ICD-10-CM | POA: Diagnosis not present

## 2014-04-30 DIAGNOSIS — Z79899 Other long term (current) drug therapy: Secondary | ICD-10-CM | POA: Diagnosis not present

## 2014-04-30 DIAGNOSIS — E785 Hyperlipidemia, unspecified: Secondary | ICD-10-CM | POA: Insufficient documentation

## 2014-04-30 DIAGNOSIS — J029 Acute pharyngitis, unspecified: Secondary | ICD-10-CM

## 2014-04-30 DIAGNOSIS — Y92410 Unspecified street and highway as the place of occurrence of the external cause: Secondary | ICD-10-CM | POA: Insufficient documentation

## 2014-04-30 DIAGNOSIS — Z85820 Personal history of malignant melanoma of skin: Secondary | ICD-10-CM | POA: Diagnosis not present

## 2014-04-30 DIAGNOSIS — D72829 Elevated white blood cell count, unspecified: Secondary | ICD-10-CM

## 2014-04-30 DIAGNOSIS — R51 Headache: Secondary | ICD-10-CM

## 2014-04-30 DIAGNOSIS — Z87891 Personal history of nicotine dependence: Secondary | ICD-10-CM | POA: Insufficient documentation

## 2014-04-30 DIAGNOSIS — Z8739 Personal history of other diseases of the musculoskeletal system and connective tissue: Secondary | ICD-10-CM | POA: Diagnosis not present

## 2014-04-30 DIAGNOSIS — Z951 Presence of aortocoronary bypass graft: Secondary | ICD-10-CM | POA: Diagnosis not present

## 2014-04-30 DIAGNOSIS — I10 Essential (primary) hypertension: Secondary | ICD-10-CM | POA: Insufficient documentation

## 2014-04-30 DIAGNOSIS — J181 Lobar pneumonia, unspecified organism: Secondary | ICD-10-CM

## 2014-04-30 DIAGNOSIS — I2510 Atherosclerotic heart disease of native coronary artery without angina pectoris: Secondary | ICD-10-CM | POA: Insufficient documentation

## 2014-04-30 DIAGNOSIS — K219 Gastro-esophageal reflux disease without esophagitis: Secondary | ICD-10-CM | POA: Insufficient documentation

## 2014-04-30 DIAGNOSIS — I252 Old myocardial infarction: Secondary | ICD-10-CM | POA: Insufficient documentation

## 2014-04-30 DIAGNOSIS — Z8719 Personal history of other diseases of the digestive system: Secondary | ICD-10-CM | POA: Diagnosis not present

## 2014-04-30 LAB — CBC WITH DIFFERENTIAL/PLATELET
BASOS ABS: 0 10*3/uL (ref 0.0–0.1)
BASOS ABS: 0 10*3/uL (ref 0.0–0.1)
Basophils Relative: 0.3 % (ref 0.0–3.0)
Basophils Relative: 0 % (ref 0–1)
EOS ABS: 0.1 10*3/uL (ref 0.0–0.7)
Eosinophils Absolute: 0 10*3/uL (ref 0.0–0.7)
Eosinophils Relative: 0.6 % (ref 0.0–5.0)
Eosinophils Relative: 0 % (ref 0–5)
HCT: 41.5 % (ref 39.0–52.0)
HCT: 37.5 % — ABNORMAL LOW (ref 39.0–52.0)
HEMOGLOBIN: 13 g/dL (ref 13.0–17.0)
Hemoglobin: 14.1 g/dL (ref 13.0–17.0)
LYMPHS PCT: 13 % (ref 12.0–46.0)
LYMPHS PCT: 4 % — AB (ref 12–46)
Lymphs Abs: 1.7 10*3/uL (ref 0.7–4.0)
Lymphs Abs: 0.5 10*3/uL — ABNORMAL LOW (ref 0.7–4.0)
MCH: 31.5 pg (ref 26.0–34.0)
MCHC: 34.1 g/dL (ref 30.0–36.0)
MCHC: 34.7 g/dL (ref 30.0–36.0)
MCV: 92.5 fl (ref 78.0–100.0)
MCV: 90.8 fL (ref 78.0–100.0)
MONO ABS: 1.1 10*3/uL — AB (ref 0.1–1.0)
Monocytes Absolute: 1.2 10*3/uL — ABNORMAL HIGH (ref 0.1–1.0)
Monocytes Relative: 8.9 % (ref 3.0–12.0)
Monocytes Relative: 8 % (ref 3–12)
NEUTROS ABS: 12.1 10*3/uL — AB (ref 1.7–7.7)
NEUTROS PCT: 88 % — AB (ref 43–77)
Neutro Abs: 10.2 10*3/uL — ABNORMAL HIGH (ref 1.4–7.7)
Neutrophils Relative %: 77.2 % — ABNORMAL HIGH (ref 43.0–77.0)
PLATELETS: 257 10*3/uL (ref 150.0–400.0)
Platelets: 195 10*3/uL (ref 150–400)
RBC: 4.48 Mil/uL (ref 4.22–5.81)
RBC: 4.13 MIL/uL — ABNORMAL LOW (ref 4.22–5.81)
RDW: 13 % (ref 11.5–15.5)
RDW: 12.9 % (ref 11.5–15.5)
WBC: 13.2 10*3/uL — ABNORMAL HIGH (ref 4.0–10.5)
WBC: 13.7 10*3/uL — AB (ref 4.0–10.5)

## 2014-04-30 LAB — COMPREHENSIVE METABOLIC PANEL
ALT: 11 U/L (ref 0–53)
AST: 17 U/L (ref 0–37)
Albumin: 3.5 g/dL (ref 3.5–5.2)
Alkaline Phosphatase: 66 U/L (ref 39–117)
Anion gap: 14 (ref 5–15)
BUN: 12 mg/dL (ref 6–23)
CHLORIDE: 99 meq/L (ref 96–112)
CO2: 24 meq/L (ref 19–32)
CREATININE: 0.92 mg/dL (ref 0.50–1.35)
Calcium: 8.9 mg/dL (ref 8.4–10.5)
GFR calc Af Amer: 89 mL/min — ABNORMAL LOW (ref >90)
GFR, EST NON AFRICAN AMERICAN: 77 mL/min — AB (ref >90)
Glucose, Bld: 131 mg/dL — ABNORMAL HIGH (ref 70–99)
Potassium: 4 mEq/L (ref 3.7–5.3)
Sodium: 137 mEq/L (ref 137–147)
Total Bilirubin: 1 mg/dL (ref 0.3–1.2)
Total Protein: 7 g/dL (ref 6.0–8.3)

## 2014-04-30 LAB — LIPASE, BLOOD: LIPASE: 17 U/L (ref 11–59)

## 2014-04-30 LAB — I-STAT TROPONIN, ED: TROPONIN I, POC: 0 ng/mL (ref 0.00–0.08)

## 2014-04-30 MED ORDER — ACETAMINOPHEN 325 MG PO TABS
650.0000 mg | ORAL_TABLET | Freq: Once | ORAL | Status: AC
  Administered 2014-04-30: 650 mg via ORAL
  Filled 2014-04-30: qty 2

## 2014-04-30 MED ORDER — LEVOFLOXACIN 750 MG PO TABS
750.0000 mg | ORAL_TABLET | Freq: Once | ORAL | Status: AC
  Administered 2014-04-30: 750 mg via ORAL
  Filled 2014-04-30: qty 1

## 2014-04-30 MED ORDER — LEVOFLOXACIN 500 MG PO TABS: 500.0000 mg | ORAL_TABLET | Freq: Every day | ORAL | Status: DC

## 2014-04-30 MED ORDER — SODIUM CHLORIDE 0.9 % IV BOLUS (SEPSIS)
1000.0000 mL | Freq: Once | INTRAVENOUS | Status: AC
  Administered 2014-04-30: 1000 mL via INTRAVENOUS

## 2014-04-30 MED ORDER — LEVOFLOXACIN 750 MG PO TABS: 750.0000 mg | ORAL_TABLET | Freq: Every day | ORAL | Status: AC

## 2014-04-30 MED ORDER — DEXTROSE 5 % IV SOLN
500.0000 mg | Freq: Once | INTRAVENOUS | Status: DC
  Filled 2014-04-30: qty 500

## 2014-04-30 MED ORDER — IOHEXOL 300 MG/ML  SOLN
80.0000 mL | Freq: Once | INTRAMUSCULAR | Status: AC | PRN
  Administered 2014-04-30: 80 mL via INTRAVENOUS

## 2014-04-30 MED ORDER — ONDANSETRON 4 MG PO TBDP: 4.0000 mg | ORAL_TABLET | Freq: Three times a day (TID) | ORAL | Status: DC | PRN

## 2014-04-30 NOTE — ED Notes (Signed)
Family requesting fluids for the pt
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
## 2014-04-30 NOTE — Progress Notes (Signed)
Pre visit review using our clinic review tool, if applicable. No additional management support is needed unless otherwise documented below in the visit note. 
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
## 2014-04-30 NOTE — Patient Instructions (Signed)

## 2014-04-30 NOTE — ED Notes (Signed)
Pt did receive zofran 4 mg from EMS

## 2014-04-30 NOTE — ED Notes (Signed)
Pt unable to give urine specimen.
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
## 2014-04-30 NOTE — Discharge Instructions (Signed)

## 2014-05-01 ENCOUNTER — Telehealth: Payer: Self-pay | Admitting: Nurse Practitioner
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
## 2014-05-01 NOTE — Telephone Encounter (Signed)
Called to clarify Levaquin prescription.  Spoke with wife and verified prescription from ER.

## 2014-05-01 NOTE — Telephone Encounter (Signed)
Patient will take Levaquin 750mg  po x4 given at ER visist instead of Levaquin 500mg  1 po x7. Clarified with wife.  Patient will follow up with Dr. Asa Lente.

## 2014-05-03 ENCOUNTER — Observation Stay (HOSPITAL_COMMUNITY)
Admission: EM | Admit: 2014-05-03 | Discharge: 2014-05-03 | Disposition: A | Discharge status: Home / Self Care | Payer: Medicare Other | Source: Other Acute Inpatient Hospital | Attending: Interventional Cardiology | Admitting: Interventional Cardiology

## 2014-05-03 ENCOUNTER — Encounter (HOSPITAL_COMMUNITY): Payer: Self-pay | Admitting: Emergency Medicine

## 2014-05-03 ENCOUNTER — Emergency Department (HOSPITAL_COMMUNITY): Payer: Medicare Other

## 2014-05-03 DIAGNOSIS — Z950 Presence of cardiac pacemaker: Secondary | ICD-10-CM

## 2014-05-03 DIAGNOSIS — R066 Hiccough: Principal | ICD-10-CM

## 2014-05-03 DIAGNOSIS — I209 Angina pectoris, unspecified: Secondary | ICD-10-CM | POA: Diagnosis not present

## 2014-05-03 DIAGNOSIS — I4891 Unspecified atrial fibrillation: Secondary | ICD-10-CM | POA: Diagnosis not present

## 2014-05-03 DIAGNOSIS — Z7901 Long term (current) use of anticoagulants: Secondary | ICD-10-CM | POA: Diagnosis not present

## 2014-05-03 DIAGNOSIS — I495 Sick sinus syndrome: Secondary | ICD-10-CM | POA: Diagnosis not present

## 2014-05-03 DIAGNOSIS — R112 Nausea with vomiting, unspecified: Secondary | ICD-10-CM | POA: Diagnosis present

## 2014-05-03 DIAGNOSIS — I10 Essential (primary) hypertension: Secondary | ICD-10-CM | POA: Insufficient documentation

## 2014-05-03 DIAGNOSIS — E782 Mixed hyperlipidemia: Secondary | ICD-10-CM | POA: Diagnosis not present

## 2014-05-03 DIAGNOSIS — I25810 Atherosclerosis of coronary artery bypass graft(s) without angina pectoris: Secondary | ICD-10-CM | POA: Diagnosis present

## 2014-05-03 DIAGNOSIS — R079 Chest pain, unspecified: Secondary | ICD-10-CM | POA: Diagnosis not present

## 2014-05-03 DIAGNOSIS — I219 Acute myocardial infarction, unspecified: Secondary | ICD-10-CM | POA: Diagnosis present

## 2014-05-03 DIAGNOSIS — I252 Old myocardial infarction: Secondary | ICD-10-CM | POA: Diagnosis not present

## 2014-05-03 DIAGNOSIS — Z951 Presence of aortocoronary bypass graft: Secondary | ICD-10-CM | POA: Insufficient documentation

## 2014-05-03 DIAGNOSIS — I2510 Atherosclerotic heart disease of native coronary artery without angina pectoris: Secondary | ICD-10-CM | POA: Diagnosis not present

## 2014-05-03 DIAGNOSIS — I4892 Unspecified atrial flutter: Secondary | ICD-10-CM | POA: Diagnosis not present

## 2014-05-03 HISTORY — DX: Atherosclerotic heart disease of native coronary artery without angina pectoris: I25.10

## 2014-05-03 LAB — BASIC METABOLIC PANEL
Anion gap: 15 (ref 5–15)
BUN: 11 mg/dL (ref 6–23)
CHLORIDE: 99 meq/L (ref 96–112)
CO2: 19 mEq/L (ref 19–32)
Calcium: 8.7 mg/dL (ref 8.4–10.5)
Creatinine, Ser: 0.92 mg/dL (ref 0.50–1.35)
GFR, EST AFRICAN AMERICAN: 89 mL/min — AB (ref >90)
GFR, EST NON AFRICAN AMERICAN: 77 mL/min — AB (ref >90)
Glucose, Bld: 103 mg/dL — ABNORMAL HIGH (ref 70–99)
POTASSIUM: 4.1 meq/L (ref 3.7–5.3)
Sodium: 133 mEq/L — ABNORMAL LOW (ref 137–147)

## 2014-05-03 LAB — CBC
HEMATOCRIT: 34.3 % — AB (ref 39.0–52.0)
HEMOGLOBIN: 11.8 g/dL — AB (ref 13.0–17.0)
MCH: 30.2 pg (ref 26.0–34.0)
MCHC: 34.4 g/dL (ref 30.0–36.0)
MCV: 87.7 fL (ref 78.0–100.0)
Platelets: 244 10*3/uL (ref 150–400)
RBC: 3.91 MIL/uL — ABNORMAL LOW (ref 4.22–5.81)
RDW: 12.6 % (ref 11.5–15.5)
WBC: 10.2 10*3/uL (ref 4.0–10.5)

## 2014-05-03 LAB — I-STAT CHEM 8, ED
BUN: 9 mg/dL (ref 6–23)
CHLORIDE: 104 meq/L (ref 96–112)
Calcium, Ion: 1.05 mmol/L — ABNORMAL LOW (ref 1.13–1.30)
Creatinine, Ser: 0.9 mg/dL (ref 0.50–1.35)
Glucose, Bld: 108 mg/dL — ABNORMAL HIGH (ref 70–99)
HCT: 39 % (ref 39.0–52.0)
Hemoglobin: 13.3 g/dL (ref 13.0–17.0)
POTASSIUM: 3.8 meq/L (ref 3.7–5.3)
Sodium: 134 mEq/L — ABNORMAL LOW (ref 137–147)
TCO2: 20 mmol/L (ref 0–100)

## 2014-05-03 LAB — TROPONIN I

## 2014-05-03 LAB — I-STAT TROPONIN, ED: TROPONIN I, POC: 0.01 ng/mL (ref 0.00–0.08)

## 2014-05-03 MED ORDER — SODIUM CHLORIDE 0.9 % IV SOLN
12.5000 mg | Freq: Once | INTRAVENOUS | Status: AC
  Administered 2014-05-03: 12.5 mg via INTRAVENOUS
  Filled 2014-05-03 (×2): qty 0.5
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
## 2014-05-03 NOTE — ED Notes (Signed)
Pt refused to stay for observation.  Discharged home, friend to take him home.
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
## 2014-05-03 NOTE — ED Notes (Signed)
2000 on 8/20 began having cp, pressure mid chest.  Ntg x 1 given along with 324 asa by ems.  Is consistent with previous MI.

## 2014-05-03 NOTE — Discharge Instructions (Signed)
Return to see her cardiologist or to the emergency department if your chest pain comes back Or you change your mind and would like further observation the hospital.  If you were given medicines take as directed.  If you are on coumadin or contraceptives realize their levels and effectiveness is altered by many different medicines.  If you have any reaction (rash, tongues swelling, other) to the medicines stop taking and see a physician.   Please follow up as directed and return to the ER or see a physician for new or worsening symptoms.  Thank you. Filed Vitals:   05/03/14 0421 05/03/14 0430 05/03/14 0500 05/03/14 0545  BP: 99/56 96/58 105/57 97/60  Pulse: 72 70 71 69  Resp: 20 20 22 18   Height:      Weight:      SpO2: 97% 98% 96% 95%

## 2014-05-03 NOTE — ED Notes (Signed)
Cardiac underlying rhythm a-flutter.  Reports chest pressure left when hiccoughs stopped.

## 2014-05-03 NOTE — ED Notes (Signed)
Began calling report, pt stated he feels fine and is going home.  Refuses to be admitted.  He told Dr Reather Converse that the hiccoughs are gone and so is his chest pressure.  He told me he is going to take a taxi home.
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
## 2014-05-03 NOTE — ED Notes (Signed)
Hiccoughs have stopped.  Pt sleeping, son at bedside.

## 2014-05-03 NOTE — ED Notes (Signed)
Incontinent of urine. Urinal placed between legs.
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
## 2014-05-03 NOTE — ED Notes (Addendum)
Pt continues to sleep soundly. No hiccoughs.

## 2014-05-08 ENCOUNTER — Encounter: Payer: Self-pay | Admitting: Internal Medicine

## 2014-05-08 ENCOUNTER — Ambulatory Visit (INDEPENDENT_AMBULATORY_CARE_PROVIDER_SITE_OTHER): Payer: Medicare Other | Admitting: Internal Medicine

## 2014-05-08 VITALS — BP 110/76 | HR 68 | Temp 97.8°F | Wt 134.2 lb

## 2014-05-08 DIAGNOSIS — R0609 Other forms of dyspnea: Secondary | ICD-10-CM

## 2014-05-08 DIAGNOSIS — R0989 Other specified symptoms and signs involving the circulatory and respiratory systems: Principal | ICD-10-CM

## 2014-05-08 DIAGNOSIS — R0600 Dyspnea, unspecified: Secondary | ICD-10-CM

## 2014-05-08 MED ORDER — ALBUTEROL SULFATE HFA 108 (90 BASE) MCG/ACT IN AERS: 2.0000 | INHALATION_SPRAY | Freq: Four times a day (QID) | RESPIRATORY_TRACT | Status: DC | PRN

## 2014-05-08 NOTE — Progress Notes (Signed)
Pre visit review using our clinic review tool, if applicable. No additional management support is needed unless otherwise documented below in the visit note. 
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
## 2014-05-08 NOTE — Assessment & Plan Note (Addendum)
ECG reviewed as per emr, pt tx in office with albuterol neb tx empiric and is quite happy with result and excellent resolution of all symptoms, ok for albut MDI prn for presumed mild subclinical bronchospasm vs copd vs other, declines further eval such as repeat cxr, ct or PFT's or other med such as steroid trial for now,  to f/u any worsening symptoms or concerns

## 2014-05-08 NOTE — Patient Instructions (Signed)
Please take all new medication as prescribed  Please continue all other medications as before, and refills have been done if requested.  Please have the pharmacy call with any other refills you may need.  Please keep your appointments with your specialists as you may have planned  Please return in 2 weeks to See Dr Asa Lente

## 2014-05-12 ENCOUNTER — Emergency Department (HOSPITAL_COMMUNITY)
Admission: EM | Admit: 2014-05-12 | Discharge: 2014-05-12 | Disposition: A | Discharge status: Home / Self Care | Payer: Medicare Other | Attending: Emergency Medicine | Admitting: Emergency Medicine

## 2014-05-12 ENCOUNTER — Encounter (HOSPITAL_COMMUNITY): Payer: Self-pay | Admitting: Emergency Medicine

## 2014-05-12 DIAGNOSIS — Z760 Encounter for issue of repeat prescription: Secondary | ICD-10-CM | POA: Insufficient documentation

## 2014-05-12 DIAGNOSIS — Z792 Long term (current) use of antibiotics: Secondary | ICD-10-CM | POA: Insufficient documentation

## 2014-05-12 DIAGNOSIS — M1990 Unspecified osteoarthritis, unspecified site: Secondary | ICD-10-CM | POA: Diagnosis not present

## 2014-05-12 DIAGNOSIS — Z860100 Personal history of colon polyps, unspecified: Secondary | ICD-10-CM | POA: Insufficient documentation

## 2014-05-12 DIAGNOSIS — M129 Arthropathy, unspecified: Secondary | ICD-10-CM | POA: Diagnosis not present

## 2014-05-12 DIAGNOSIS — Z8611 Personal history of tuberculosis: Secondary | ICD-10-CM | POA: Insufficient documentation

## 2014-05-12 DIAGNOSIS — E785 Hyperlipidemia, unspecified: Secondary | ICD-10-CM | POA: Insufficient documentation

## 2014-05-12 DIAGNOSIS — Z87828 Personal history of other (healed) physical injury and trauma: Secondary | ICD-10-CM | POA: Insufficient documentation

## 2014-05-12 DIAGNOSIS — Z7901 Long term (current) use of anticoagulants: Secondary | ICD-10-CM | POA: Insufficient documentation

## 2014-05-12 DIAGNOSIS — Z9889 Other specified postprocedural states: Secondary | ICD-10-CM | POA: Diagnosis not present

## 2014-05-12 DIAGNOSIS — Z880 Allergy status to penicillin: Secondary | ICD-10-CM | POA: Diagnosis not present

## 2014-05-12 DIAGNOSIS — Z85828 Personal history of other malignant neoplasm of skin: Secondary | ICD-10-CM | POA: Insufficient documentation

## 2014-05-12 DIAGNOSIS — Z87891 Personal history of nicotine dependence: Secondary | ICD-10-CM | POA: Insufficient documentation

## 2014-05-12 DIAGNOSIS — Z79899 Other long term (current) drug therapy: Secondary | ICD-10-CM | POA: Diagnosis not present

## 2014-05-12 DIAGNOSIS — Z872 Personal history of diseases of the skin and subcutaneous tissue: Secondary | ICD-10-CM | POA: Diagnosis not present

## 2014-05-12 DIAGNOSIS — Z951 Presence of aortocoronary bypass graft: Secondary | ICD-10-CM | POA: Diagnosis not present

## 2014-05-12 DIAGNOSIS — I4892 Unspecified atrial flutter: Secondary | ICD-10-CM | POA: Diagnosis not present

## 2014-05-12 DIAGNOSIS — R011 Cardiac murmur, unspecified: Secondary | ICD-10-CM | POA: Insufficient documentation

## 2014-05-12 DIAGNOSIS — Z8719 Personal history of other diseases of the digestive system: Secondary | ICD-10-CM | POA: Insufficient documentation

## 2014-05-12 DIAGNOSIS — I252 Old myocardial infarction: Secondary | ICD-10-CM | POA: Diagnosis not present

## 2014-05-12 DIAGNOSIS — I4891 Unspecified atrial fibrillation: Secondary | ICD-10-CM | POA: Diagnosis not present

## 2014-05-12 DIAGNOSIS — Z950 Presence of cardiac pacemaker: Secondary | ICD-10-CM | POA: Insufficient documentation

## 2014-05-12 DIAGNOSIS — I10 Essential (primary) hypertension: Secondary | ICD-10-CM | POA: Insufficient documentation

## 2014-05-12 DIAGNOSIS — I2510 Atherosclerotic heart disease of native coronary artery without angina pectoris: Secondary | ICD-10-CM | POA: Insufficient documentation

## 2014-05-12 MED ORDER — ONDANSETRON 4 MG PO TBDP: 4.0000 mg | ORAL_TABLET | Freq: Three times a day (TID) | ORAL | Status: DC | PRN

## 2014-05-12 MED ORDER — APIXABAN 2.5 MG PO TABS: 2.5000 mg | ORAL_TABLET | Freq: Two times a day (BID) | ORAL | Status: DC

## 2014-05-12 MED ORDER — TAMSULOSIN HCL 0.4 MG PO CAPS: 0.4000 mg | ORAL_CAPSULE | Freq: Every evening | ORAL | Status: DC

## 2014-05-12 MED ORDER — NITROGLYCERIN 0.4 MG SL SUBL: 0.4000 mg | SUBLINGUAL_TABLET | SUBLINGUAL | Status: DC | PRN

## 2014-05-12 MED ORDER — SIMVASTATIN 20 MG PO TABS: 20.0000 mg | ORAL_TABLET | Freq: Every evening | ORAL | Status: DC

## 2014-05-12 MED ORDER — ACETAMINOPHEN 500 MG PO TABS: 500.0000 mg | ORAL_TABLET | Freq: Every day | ORAL | Status: AC | PRN

## 2014-05-12 MED ORDER — DILTIAZEM HCL ER COATED BEADS 240 MG PO TB24: 240.0000 mg | ORAL_TABLET | Freq: Every day | ORAL | Status: DC

## 2014-05-12 MED ORDER — VALSARTAN 320 MG PO TABS: 160.0000 mg | ORAL_TABLET | Freq: Every day | ORAL | Status: DC

## 2014-05-12 MED ORDER — CO Q 10 10 MG PO CAPS: 1.0000 | ORAL_CAPSULE | Freq: Every day | ORAL | Status: DC

## 2014-05-12 MED ORDER — SACCHAROMYCES BOULARDII 250 MG PO CAPS: 250.0000 mg | ORAL_CAPSULE | Freq: Two times a day (BID) | ORAL | Status: DC

## 2014-05-12 MED ORDER — VITAMIN E 180 MG (400 UNIT) PO CAPS: 400.0000 [IU] | ORAL_CAPSULE | Freq: Every day | ORAL | Status: DC

## 2014-05-12 MED ORDER — ALBUTEROL SULFATE HFA 108 (90 BASE) MCG/ACT IN AERS
2.0000 | INHALATION_SPRAY | RESPIRATORY_TRACT | Status: DC | PRN
Start: 2014-05-12 — End: 2014-05-12
  Filled 2014-05-12: qty 6.7

## 2014-05-12 MED ORDER — RANOLAZINE ER 500 MG PO TB12: 500.0000 mg | ORAL_TABLET | Freq: Two times a day (BID) | ORAL | Status: DC
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
## 2014-05-12 NOTE — ED Provider Notes (Signed)
Medical screening examination/treatment/procedure(s) were performed by non-physician practitioner and as supervising physician I was immediately available for consultation/collaboration.   EKG Interpretation None      The chart was scribed for me under my direct supervision.  I personally performed the history, physical, and medical decision making and all procedures in the evaluation of this patient.Maudry Diego, MD 05/12/14 2221

## 2014-05-12 NOTE — Discharge Instructions (Signed)
RESOURCE GUIDE ° °Chronic Pain Problems: °Contact [facility]  [phone] °Patients need to be referred by their primary care doctor. ° °Insufficient Money for Medicine: °Contact [employer]:  call "211" or [facility] [phone]. ° °No Primary Care Doctor: °Call [facility]  [phone] - can help you locate a primary care doctor that  accepts your insurance, provides certain services, etc. °Physician Referral Service- [phone] ° °Agencies that provide inexpensive medical care: °[facility]  [phone] °[facility]  [phone] °[facility]  [phone] °Women's Clinic  [phone] °Planned Parenthood  [phone] °[facility]  [phone] ° °Medicaid-accepting [place] Providers: °[facility]- [name] ° [phone], Mon-Fri 9am-7pm, Sat 9am-1pm °[facility]- [address], [address] ° [phone] °[facility]- [address], [address] ° [phone] °[facility]- [address] [place] [address] ° [phone] °[facility]- [address], [phone] ° Only accepts [place] Access Medicaid patients after they have their name  applied to their card ° °Self Pay (no insurance) in [place]: °Sickle Cell Patients: Dr Eric Dean, [facility] ° [address], [phone] °[facility]- [address] ° [phone] °      -      [facility]- [address] °      -     [facility]- see information above (Speak to Pam H if you do not have insurance) °      -  [facility]- [address], [phone] °      -  [facility] [place]- [address],  [phone] °      -  [facility]- [address] [place] [address], [phone] °      -  Dr Osei-Bonsu-  [address], [place], [phone] °      -  [facility]- [address], [phone] °      -   [facility]- [address] [place] [address], [phone], also [address], [phone] °      -    [facility]- [address], [phone], 1st & 3rd Saturday   every month, 10am-1pm ° °1) Find a Doctor and Pay Out of Pocket °Although you won't have to find out who is covered by your insurance plan, it is a good idea to ask around and get recommendations. You will then need to call the office and see if the doctor you have chosen will accept you as a new patient and what types of options they offer for patients who are self-pay. Some doctors offer discounts or will set up payment plans for their patients who do not have insurance, but you will need to ask so you aren't surprised when you get to your appointment. ° °2) Contact Your Local Health Department °Not all health departments have doctors that can see patients for sick visits, but many do, so it is worth a call to see if yours does. If you don't know where your local health department is, you can check in your phone book. The CDC also has a tool to help you locate your state's health department, and many state websites also have listings of all of their local health departments. ° °3) Find a Walk-in Clinic °If   your illness is not likely to be very severe or complicated, you may want to try a walk in clinic. These are popping up all over the country in pharmacies, drugstores, and shopping centers. They're usually staffed by nurse practitioners or physician assistants that have been trained to treat common illnesses and complaints. They're usually fairly quick and inexpensive. However, if you have serious medical issues or chronic medical problems, these are probably not your best option  [facility] [place], [facility], [address], [place], phone [phone] or [phone].  Monday - Friday, call for an appointment. [place], [facility], [address], [place], phone [phone] or [phone].  Monday - Friday, call for an appointment.  Abuse/Neglect: [place] [phone] [place] [phone] (After Hours)  Emergency Shelter:  [facility] [phone]  Maternity Homes: Room at the [place] [phone] [place] [phone]  MRSA Hotline #:   847-732-0121  [facility] Dept. [address]         [place] Phone:  [phone]                                  Phone:  [phone]                   Phone:  [phone]  [facility], [place]- [phone]       -     [facility] in [place], [address]                                  [place] [phone] or [phone] (After Hours)   [place]  Substance Abuse Resources: Alcohol and Drug Services  [phone] [place] [phone] [facility] Chinita Pester [phone] Residential & Outpatient Substance Abuse Program  [phone]  Psychological Services: [place]  [phone] [place]  [place], [place]. [address], [place], [place]: [phone] or [phone], [email or web address]  Dental Assistance  If unable to pay or uninsured, contact:  [facility] or [facility]. to become qualified for the adult dental clinic.  Patients with Medicaid: [facility] [phone] [address], [place] [address], [phone]  If unable to pay, or uninsured, contact [facility] [phone]) or [place] [phone] in [place], [place] in [facility]) to become qualified for the adult dental clinic  Other Low-Cost  [employer] Services: [facility]- [address] [place], [place], [address], [place], [address], 2nd and 4th Thursday of the month at 6:30am.  10 clients each day by appointment, can sometimes see walk-in patients if someone does not show for an appointment. [facility]- [address] [place]er [place], [place], [address], [place], [place], [place], [address], [place] [facility] [phone]  Please make every effort to establish with a primary care physician for routine medical care  [place]  The [place] provides a wide range of adult health services. Some of these services are designed to address the healthcare needs of all [facility] residents and all services are designed to meet the needs of uninsured/underinsured low income residents. Some services are available to any resident of [place], call [phone] for details. ] [facility], a new medical clinic for adults, is now open. For more information about the Center and its services please call [phone]. For information on our [place] services, click here.  For more information on any of the following Department of Public Health programs, including hours of service, click on the highlighted link.  SERVICES FOR WOMEN (Adults and Teens) [employer] provide a full range of birth control options plus education and counseling. New patient visit and annual return visits include a complete  examination, pap test as indicated, and other laboratory as indicated. Included is our [employer] for men.  Maternity Care is provided through pregnancy, including a six week post partum exam. Women who meet eligibility criteria for the Medicaid for Pregnant Women program, receive care free. Other women are charged on a sliding scale according to income. Note: [facility] provides services to pregnant women who have a Medicaid card. Call [phone] for an appointment in [place] or [phone] for an appointment in [facility].  Primary Care for Medicaid [place] Access Women is available through the [place]. As primary care provider for the [place] program, women may designate the [facility] clinic as their primary care provider.  PLEASE CALL R5958090 FOR AN APPOINTMENT FOR THE ABOVE SERVICES IN EITHER [place] OR [place]. Information available in [place] and [place].   Childbirth Education Classes are open to the public and offered to help families prepare for the best possible childbirth experience as well as to promote lifelong health and wellness. Classes are offered throughout the year and meet on the same night once a week for five weeks. Medicaid covers the cost of the classes for the mother-to-be and her partner. For participants without Medicaid, the cost of the class series is $45.00 for the mother-to-be and her partner. Class size is limited and registration is required. For more information or to register call [phone]. Baby items donated by [employer] and the [employer] [place] are given away during each class series.  SERVICES FOR WOMEN AND MEN Sexually Transmitted Infection appointments, including HIV testing, are available daily (weekdays, except holidays). Call early as same-day appointments are limited. For an appointment in either [facility] or [place],  call [phone]. Services are confidential and free of charge.  Skin Testing for Tuberculosis Please call [phone]. Adult Immunizations are available, usually for a fee. Please call [phone] for details.  PLEASE CALL [phone] FOR AN APPOINTMENT FOR THE ABOVE  SERVICES IN EITHER [place] OR [place].   [facility] provides up to the minute recommended vaccines for your travel destination. We also provide essential health and political information to help insure a safe and pleasurable travel experience. This program is self-sustaining, however, fees are very competitive. We are a CERTIFIED YELLOW FEVER IMMUNIZATION approved clinic site. PLEASE CALL [phone] FOR AN APPOINTMENT IN EITHER [place] OR [place].   If you have questions about the services listed above, we want to answer them! Email [place] at: [email or web address] Home Visiting Services for elderly and the disabled are available to residents of [facility] who are in need of care that compares to the care offered by a nursing home, have needs that can be met by the program, and have [place] Medicaid. Other short term services are available to residents 18 years and older who are unable to meet requirements for eligibility to receive services from a certified home health agency, spend the majority of time at home, and need care for six months or less.  PLEASE CALL [id number] OR [phone] FOR MORE INFORMATION. Medication Assistance Program serves as a link between pharmaceutical companies and patients to provide low cost or free prescription medications. This servce is available for residents who meet certain income restrictions and have no insurance coverage.  PLEASE CALL [phone] ([place]) OR [phone] ([place]) FOR MORE INFORMATION.  Updated Feb. 21, 2013
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
## 2014-05-13 ENCOUNTER — Other Ambulatory Visit: Payer: Self-pay | Admitting: *Deleted

## 2014-05-13 MED ORDER — APIXABAN 2.5 MG PO TABS
2.5000 mg | ORAL_TABLET | Freq: Two times a day (BID) | ORAL | Status: DC
Start: 2014-05-13 — End: 2014-09-26

## 2014-05-16 ENCOUNTER — Telehealth: Payer: Self-pay | Admitting: Interventional Cardiology
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
## 2014-05-16 NOTE — Telephone Encounter (Signed)
Spoke with pt wife and informed pt wife that we ordered a new monitor for pt and that it would be delivered to the office. Pt wife verbalized that this is ok and that she will call back in about 10 days to make sure monitor is here.

## 2014-05-22 ENCOUNTER — Encounter: Payer: Self-pay | Admitting: Internal Medicine

## 2014-05-22 ENCOUNTER — Ambulatory Visit (INDEPENDENT_AMBULATORY_CARE_PROVIDER_SITE_OTHER): Payer: Medicare Other | Admitting: Internal Medicine

## 2014-05-22 VITALS — BP 108/70 | HR 79 | Temp 97.4°F | Wt 131.5 lb

## 2014-05-22 DIAGNOSIS — G4700 Insomnia, unspecified: Secondary | ICD-10-CM | POA: Insufficient documentation

## 2014-05-22 DIAGNOSIS — R0989 Other specified symptoms and signs involving the circulatory and respiratory systems: Secondary | ICD-10-CM

## 2014-05-22 DIAGNOSIS — R0609 Other forms of dyspnea: Secondary | ICD-10-CM

## 2014-05-22 MED ORDER — TEMAZEPAM 15 MG PO CAPS: ORAL_CAPSULE | ORAL | Status: DC

## 2014-05-22 NOTE — Assessment & Plan Note (Addendum)
Acute situational most likely, likely transient, for limited temazepam prn,  to f/u any worsening symptoms or concerns

## 2014-05-22 NOTE — Progress Notes (Signed)
Pre visit review using our clinic review tool, if applicable. No additional management support is needed unless otherwise documented below in the visit note. 
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
## 2014-05-22 NOTE — Assessment & Plan Note (Signed)
Suspect mild underlying copd?  Declines pft';s for now, for cont'd inhaler use prn

## 2014-05-22 NOTE — Patient Instructions (Signed)
Please take all new medication as prescribed - the temazepam  Please continue all other medications as before, and refills have been done if requested.  Please have the pharmacy call with any other refills you may need.  Please keep your appointments with your specialists as you may have planned

## 2014-05-28 ENCOUNTER — Telehealth: Payer: Self-pay | Admitting: Cardiology

## 2014-05-28 NOTE — Telephone Encounter (Signed)
LMOVM informing pt that home monitor for implanted device is in office.

## 2014-05-30 ENCOUNTER — Telehealth: Payer: Self-pay | Admitting: *Deleted

## 2014-05-30 ENCOUNTER — Ambulatory Visit (INDEPENDENT_AMBULATORY_CARE_PROVIDER_SITE_OTHER): Payer: Medicare Other | Admitting: Internal Medicine

## 2014-05-30 ENCOUNTER — Encounter: Payer: Self-pay | Admitting: Internal Medicine

## 2014-05-30 VITALS — BP 122/78 | HR 85 | Temp 97.8°F | Wt 131.2 lb

## 2014-05-30 DIAGNOSIS — J209 Acute bronchitis, unspecified: Secondary | ICD-10-CM

## 2014-05-30 DIAGNOSIS — J208 Acute bronchitis due to other specified organisms: Secondary | ICD-10-CM

## 2014-05-30 MED ORDER — HYDROCODONE-HOMATROPINE 5-1.5 MG/5ML PO SYRP: 5.0000 mL | ORAL_SOLUTION | Freq: Four times a day (QID) | ORAL | Status: DC | PRN

## 2014-05-30 MED ORDER — AZITHROMYCIN 250 MG PO TABS: ORAL_TABLET | ORAL | Status: DC

## 2014-05-30 MED ORDER — PREDNISONE 20 MG PO TABS: 20.0000 mg | ORAL_TABLET | Freq: Two times a day (BID) | ORAL | Status: DC
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
## 2014-05-30 NOTE — Telephone Encounter (Signed)
OK X1 

## 2014-05-30 NOTE — Progress Notes (Signed)
Pre visit review using our clinic review tool, if applicable. No additional management support is needed unless otherwise documented below in the visit note. 

## 2014-05-30 NOTE — Telephone Encounter (Signed)
Left smg on triage requesting refill on his ranexa...Shaun James

## 2014-05-30 NOTE — Patient Instructions (Signed)
Do not take Chlorpromazine while on antibiotics.Carry room temperature water and sip liberally after coughing.

## 2014-05-31 ENCOUNTER — Other Ambulatory Visit: Payer: Self-pay

## 2014-05-31 MED ORDER — RANOLAZINE ER 500 MG PO TB12: 500.0000 mg | ORAL_TABLET | Freq: Two times a day (BID) | ORAL | Status: DC

## 2014-05-31 NOTE — Telephone Encounter (Signed)
Notified wife rx was sent to [facility]...Johny Chess

## 2014-05-31 NOTE — Addendum Note (Signed)
Addended by: Earnstine Regal on: 05/31/2014 08:19 AM   Modules accepted: Orders

## 2014-05-31 NOTE — Addendum Note (Signed)
Addended by: Earnstine Regal on: 05/31/2014 08:55 AM   Modules accepted: Orders

## 2014-06-11 ENCOUNTER — Telehealth: Payer: Self-pay | Admitting: Cardiology

## 2014-06-11 NOTE — Telephone Encounter (Signed)
Informed pt wife the home monitor has arrived. She stated that Mr. Urwin will come by on Wednesday 9-30 to pick up monitor.

## 2014-06-18 ENCOUNTER — Encounter: Payer: Self-pay | Admitting: *Deleted

## 2014-06-25 ENCOUNTER — Encounter: Payer: Self-pay | Admitting: Cardiology

## 2014-06-26 ENCOUNTER — Ambulatory Visit (INDEPENDENT_AMBULATORY_CARE_PROVIDER_SITE_OTHER): Payer: Medicare Other | Admitting: Internal Medicine

## 2014-06-26 ENCOUNTER — Ambulatory Visit (INDEPENDENT_AMBULATORY_CARE_PROVIDER_SITE_OTHER)
Admission: RE | Admit: 2014-06-26 | Discharge: 2014-06-26 | Disposition: A | Discharge status: Home / Self Care | Payer: Medicare Other | Source: Ambulatory Visit | Attending: Internal Medicine | Admitting: Internal Medicine

## 2014-06-26 ENCOUNTER — Encounter: Payer: Self-pay | Admitting: Internal Medicine

## 2014-06-26 VITALS — BP 120/70 | HR 64 | Temp 97.6°F | Resp 13 | Wt 130.2 lb

## 2014-06-26 DIAGNOSIS — Z23 Encounter for immunization: Secondary | ICD-10-CM

## 2014-06-26 DIAGNOSIS — R059 Cough, unspecified: Secondary | ICD-10-CM

## 2014-06-26 DIAGNOSIS — R05 Cough: Secondary | ICD-10-CM

## 2014-06-26 DIAGNOSIS — J029 Acute pharyngitis, unspecified: Secondary | ICD-10-CM

## 2014-06-26 NOTE — Patient Instructions (Addendum)
Plain Mucinex (NOT D) for thick secretions ;force NON dairy fluids .   Nasal cleansing in the shower as discussed with lather of mild shampoo.After 10 seconds wash off lather while  exhaling through nostrils. Make sure that all residual soap is removed to prevent irritation.  Flonase OR Nasacort AQ 1 spray in each nostril twice a day as needed. Use the "crossover" technique into opposite nostril spraying toward opposite ear @ 45 degree angle, not straight up into nostril.  Use a Neti pot daily only  as needed for significant sinus congestion; going from open side to congested side . Plain Allegra (NOT D )  160 daily , Loratidine 10 mg , OR Zyrtec 10 mg @ bedtime  as needed for itchy eyes & sneezing. Zicam Melts or Zinc lozenges as per package label for sore throat .  Report persistent or progressive fever; discolored nasal or chest secretions; or frontal headache or facial  pain.   Your next office appointment will be determined based upon review of your pending x-rays. Those instructions will be transmitted to you by mail Followup as needed for your acute issue. Please report any significant change in your symptoms.
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
## 2014-06-26 NOTE — Progress Notes (Signed)
Pre visit review using our clinic review tool, if applicable. No additional management support is needed unless otherwise documented below in the visit note. 

## 2014-07-17 ENCOUNTER — Encounter: Payer: Self-pay | Admitting: Internal Medicine

## 2014-07-17 ENCOUNTER — Ambulatory Visit (INDEPENDENT_AMBULATORY_CARE_PROVIDER_SITE_OTHER): Payer: Medicare Other | Admitting: Internal Medicine

## 2014-07-17 VITALS — BP 122/80 | HR 84 | Temp 97.8°F | Ht 64.0 in | Wt 133.0 lb

## 2014-07-17 DIAGNOSIS — R05 Cough: Secondary | ICD-10-CM

## 2014-07-17 DIAGNOSIS — J929 Pleural plaque without asbestos: Secondary | ICD-10-CM | POA: Insufficient documentation

## 2014-07-17 DIAGNOSIS — R9389 Abnormal findings on diagnostic imaging of other specified body structures: Secondary | ICD-10-CM

## 2014-07-17 DIAGNOSIS — J411 Mucopurulent chronic bronchitis: Secondary | ICD-10-CM

## 2014-07-17 DIAGNOSIS — R059 Cough, unspecified: Secondary | ICD-10-CM

## 2014-07-17 DIAGNOSIS — R938 Abnormal findings on diagnostic imaging of other specified body structures: Secondary | ICD-10-CM

## 2014-07-17 MED ORDER — TIOTROPIUM BROMIDE MONOHYDRATE 2.5 MCG/ACT IN AERS: 2.0000 | INHALATION_SPRAY | Freq: Every day | RESPIRATORY_TRACT | Status: DC

## 2014-07-17 MED ORDER — TEMAZEPAM 15 MG PO CAPS: ORAL_CAPSULE | ORAL | Status: DC
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
## 2014-07-17 NOTE — Patient Instructions (Signed)

## 2014-07-17 NOTE — Assessment & Plan Note (Signed)
Will start spiriva for symptom relief Will get PFT's done to see if there is COPD, restrictive pattern, asthma, etc

## 2014-07-17 NOTE — Assessment & Plan Note (Signed)
Will get a high resolution CT scan done to see if there is ILD, fibrosis, etc
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
## 2014-07-17 NOTE — Progress Notes (Signed)
Pre visit review using our clinic review tool, if applicable. No additional management support is needed unless otherwise documented below in the visit note. 

## 2014-07-18 ENCOUNTER — Encounter: Payer: Self-pay | Admitting: Internal Medicine

## 2014-07-18 ENCOUNTER — Ambulatory Visit (INDEPENDENT_AMBULATORY_CARE_PROVIDER_SITE_OTHER): Payer: Medicare Other | Admitting: Internal Medicine

## 2014-07-18 VITALS — BP 112/74 | HR 70 | Ht 65.0 in | Wt 136.0 lb

## 2014-07-18 DIAGNOSIS — R059 Cough, unspecified: Secondary | ICD-10-CM

## 2014-07-18 DIAGNOSIS — J705 Respiratory conditions due to smoke inhalation: Secondary | ICD-10-CM

## 2014-07-18 DIAGNOSIS — R9389 Abnormal findings on diagnostic imaging of other specified body structures: Secondary | ICD-10-CM

## 2014-07-18 DIAGNOSIS — R938 Abnormal findings on diagnostic imaging of other specified body structures: Secondary | ICD-10-CM

## 2014-07-18 DIAGNOSIS — R0600 Dyspnea, unspecified: Secondary | ICD-10-CM

## 2014-07-18 DIAGNOSIS — R05 Cough: Secondary | ICD-10-CM

## 2014-07-18 MED ORDER — PANTOPRAZOLE SODIUM 40 MG PO TBEC: 40.0000 mg | DELAYED_RELEASE_TABLET | Freq: Every day | ORAL | Status: DC

## 2014-07-18 MED ORDER — PREDNISONE 10 MG PO TABS: ORAL_TABLET | ORAL | Status: DC

## 2014-07-18 MED ORDER — FAMOTIDINE 20 MG PO TABS: ORAL_TABLET | ORAL | Status: DC

## 2014-07-18 NOTE — Patient Instructions (Addendum)
Stop   all inhalers - hold the fish oil, vit E and Coenzyme 10 until the cough is gone for at least a week without the need for cough medication then ok to resume  Prednisone 10 mg take  4 each am x 2 days,   2 each am x 2 days,  1 each am x 2 days and stop   For cough use delsym 2 tsp every 12 hours as needed   Pantoprazole (protonix) 40 mg   Take 30-60 min before first meal of the day and Pepcid 20 mg one bedtime until return to office - this is the best way to tell whether stomach acid is contributing to your problem.    GERD (REFLUX)  is an extremely common cause of respiratory symptoms just like yours , many times with no obvious heartburn at all.    It can be treated with medication, but also with lifestyle changes including avoidance of late meals, excessive alcohol, smoking cessation, and avoid fatty foods, chocolate, peppermint, colas, red wine, and acidic juices such as orange juice.  NO MINT OR MENTHOL PRODUCTS SO NO COUGH DROPS  USE SUGARLESS CANDY INSTEAD (Jolley ranchers or Stover's or Life Savers) or even ice chips will also do - the key is to swallow to prevent all throat clearing. NO OIL BASED VITAMINS - use powdered substitutes.  Please schedule a follow up office visit in 2 weeks, sooner if needed if not improving
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
## 2014-07-19 ENCOUNTER — Other Ambulatory Visit: Payer: Self-pay | Admitting: *Deleted

## 2014-07-19 MED ORDER — DILTIAZEM HCL ER COATED BEADS 240 MG PO TB24: 240.0000 mg | ORAL_TABLET | Freq: Every day | ORAL | Status: DC
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
## 2014-07-21 NOTE — Assessment & Plan Note (Addendum)
12/29/2011 Office Visit Written 12/29/2011 6:03 PM by Rigoberto Noel, MD   No obvious pulmonary cause for his dyspnea. There is no airway obstruction on spirometry which rules out COPD     07/18/2014  Walked RA  2 laps @ 185 ft each stopped due to  Unstable on feet,mod  pace, no desat or sob    No further w/u needed at this point

## 2014-07-22 ENCOUNTER — Ambulatory Visit (INDEPENDENT_AMBULATORY_CARE_PROVIDER_SITE_OTHER): Payer: Medicare Other | Admitting: *Deleted

## 2014-07-22 DIAGNOSIS — I495 Sick sinus syndrome: Secondary | ICD-10-CM

## 2014-07-22 LAB — MDC_IDC_ENUM_SESS_TYPE_INCLINIC
Lead Channel Impedance Value: 685 Ohm
Lead Channel Pacing Threshold Amplitude: 0.6 V
Lead Channel Pacing Threshold Pulse Width: 0.4 ms
Lead Channel Sensing Intrinsic Amplitude: 24.4 mV
Lead Channel Setting Pacing Amplitude: 1 V
Lead Channel Setting Pacing Amplitude: 2 V
Lead Channel Setting Pacing Pulse Width: 0.4 ms
Lead Channel Setting Sensing Sensitivity: 2.5 mV
MDC IDC MSMT LEADCHNL RA IMPEDANCE VALUE: 640 Ohm
MDC IDC MSMT LEADCHNL RA PACING THRESHOLD AMPLITUDE: 1 V
MDC IDC MSMT LEADCHNL RA PACING THRESHOLD PULSEWIDTH: 0.4 ms
MDC IDC MSMT LEADCHNL RA SENSING INTR AMPL: 3.8 mV
MDC IDC PG SERIAL: 118262
MDC IDC SESS DTM: 20151109050000
Zone Setting Detection Interval: 375 ms

## 2014-07-22 MED ORDER — DILTIAZEM HCL ER COATED BEADS 240 MG PO TB24: 240.0000 mg | ORAL_TABLET | Freq: Every day | ORAL | Status: DC

## 2014-07-22 NOTE — Progress Notes (Signed)
Pacemaker check in clinic. Normal device function. Thresholds, sensing, impedances consistent with previous measurements. Device programmed to maximize longevity. 100 ATR (<1%)---(66) <1 min, (26) btwn 1 min-<1 hr, (8) btwn 1hr-<24 hrs + Eliquis. No high ventricular rates noted. Device programmed at appropriate safety margins. Histogram distribution appropriate for patient activity level. Device programmed to optimize intrinsic conduction. Estimated longevity 7 years. Patient will follow up with [facility] on 2-9 @ 11:00am.

## 2014-07-31 ENCOUNTER — Encounter: Payer: Self-pay | Admitting: Internal Medicine

## 2014-07-31 ENCOUNTER — Ambulatory Visit (INDEPENDENT_AMBULATORY_CARE_PROVIDER_SITE_OTHER)
Admission: RE | Admit: 2014-07-31 | Discharge: 2014-07-31 | Disposition: A | Discharge status: Home / Self Care | Payer: Medicare Other | Source: Ambulatory Visit | Attending: Internal Medicine | Admitting: Internal Medicine

## 2014-07-31 DIAGNOSIS — R05 Cough: Secondary | ICD-10-CM

## 2014-07-31 DIAGNOSIS — R9389 Abnormal findings on diagnostic imaging of other specified body structures: Secondary | ICD-10-CM

## 2014-07-31 DIAGNOSIS — R938 Abnormal findings on diagnostic imaging of other specified body structures: Secondary | ICD-10-CM

## 2014-07-31 DIAGNOSIS — R059 Cough, unspecified: Secondary | ICD-10-CM

## 2014-08-01 ENCOUNTER — Encounter: Payer: Self-pay | Admitting: Internal Medicine

## 2014-08-01 ENCOUNTER — Ambulatory Visit (INDEPENDENT_AMBULATORY_CARE_PROVIDER_SITE_OTHER): Payer: Medicare Other | Admitting: Internal Medicine

## 2014-08-01 VITALS — BP 104/66 | HR 68 | Temp 97.5°F | Ht 65.0 in | Wt 136.0 lb

## 2014-08-01 DIAGNOSIS — J8410 Pulmonary fibrosis, unspecified: Secondary | ICD-10-CM

## 2014-08-01 DIAGNOSIS — J929 Pleural plaque without asbestos: Secondary | ICD-10-CM

## 2014-08-01 DIAGNOSIS — R05 Cough: Secondary | ICD-10-CM

## 2014-08-01 DIAGNOSIS — R059 Cough, unspecified: Secondary | ICD-10-CM
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
## 2014-08-01 NOTE — Patient Instructions (Addendum)
No change in previous recommendations to continue the protonix before bfast and pepcid at bedtime   Continue to do the treadmill but pace yourself to where you always do 30 minutes  Please schedule a follow up office visit in 4 weeks, sooner if needed with pfts

## 2014-08-03 ENCOUNTER — Encounter: Payer: Self-pay | Admitting: Internal Medicine

## 2014-08-03 NOTE — Assessment & Plan Note (Signed)
1st reported to have R pleual plque dating back to 2004 (xrays not available) so likely remote pna/ parapneumonic process
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
## 2014-08-03 NOTE — Assessment & Plan Note (Signed)
Resolved to pt satisfaction p rx for gerd

## 2014-08-07 ENCOUNTER — Encounter: Payer: Self-pay | Admitting: Internal Medicine

## 2014-08-16 ENCOUNTER — Ambulatory Visit (INDEPENDENT_AMBULATORY_CARE_PROVIDER_SITE_OTHER): Payer: Medicare Other | Admitting: Internal Medicine

## 2014-08-16 ENCOUNTER — Encounter: Payer: Self-pay | Admitting: Internal Medicine

## 2014-08-16 VITALS — BP 140/78 | HR 66 | Ht 64.0 in | Wt 139.0 lb

## 2014-08-16 DIAGNOSIS — R059 Cough, unspecified: Secondary | ICD-10-CM

## 2014-08-16 DIAGNOSIS — R9389 Abnormal findings on diagnostic imaging of other specified body structures: Secondary | ICD-10-CM

## 2014-08-16 DIAGNOSIS — J929 Pleural plaque without asbestos: Secondary | ICD-10-CM

## 2014-08-16 DIAGNOSIS — J8410 Pulmonary fibrosis, unspecified: Secondary | ICD-10-CM

## 2014-08-16 DIAGNOSIS — R05 Cough: Secondary | ICD-10-CM

## 2014-08-16 LAB — PULMONARY FUNCTION TEST
DL/VA % pred: 92 %
DL/VA: 3.83 ml/min/mmHg/L
DLCO UNC % PRED: 57 %
DLCO unc: 13.89 ml/min/mmHg
FEF 25-75 Post: 2.26 L/sec
FEF 25-75 Pre: 2.37 L/sec
FEF2575-%Change-Post: -4 %
FEF2575-%Pred-Post: 139 %
FEF2575-%Pred-Pre: 146 %
FEV1-%Change-Post: 1 %
FEV1-%PRED-POST: 105 %
FEV1-%Pred-Pre: 104 %
FEV1-Post: 2.3 L
FEV1-Pre: 2.27 L
FEV1FVC-%Change-Post: 2 %
FEV1FVC-%PRED-PRE: 104 %
FEV6-%CHANGE-POST: -1 %
FEV6-POST: 2.81 L
FEV6-Pre: 2.85 L
FEV6FVC-%CHANGE-POST: 0 %
FVC-%CHANGE-POST: -1 %
FVC-%PRED-POST: 99 %
FVC-%PRED-PRE: 100 %
FVC-POST: 2.83 L
FVC-PRE: 2.87 L
Post FEV1/FVC ratio: 81 %
Post FEV6/FVC ratio: 99 %
Pre FEV1/FVC ratio: 79 %
Pre FEV6/FVC Ratio: 100 %
RV % PRED: 49 %
RV: 1.16 L
TLC % pred: 64 %
TLC: 3.76 L

## 2014-08-16 NOTE — Progress Notes (Signed)
PFT done today. 

## 2014-08-16 NOTE — Patient Instructions (Addendum)
Let us know if you are losing ground with tolerance to exercise   Stay on acid suppression and diet   Please schedule a follow up visit in 6  months but call sooner if needed  pfts and cxr on return
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
## 2014-08-17 NOTE — Assessment & Plan Note (Signed)
12/29/2011 Office Visit Written 12/29/2011 6:03 PM by Rigoberto Noel, MD   No obvious pulmonary cause for his dyspnea. There is no airway obstruction on spirometry which rules out COPD    07/18/2014  Walked RA  2 laps @ 185 ft each stopped due to  Unstable on feet, mod  pace, no desat or sob  HRCT 07/31/14 Pulmonary parenchymal pattern of interstitial lung disease is indicative of nonspecific interstitial pneumonitis (NSIP) or possibly usual interstitial pneumonitis (UIP). Asbestosis is considered less likely, given the unilateral nature of the calcified pleural plaques, but is not exclude 08/02/2014  Walked RA x 3 laps @ 185 ft each stopped due to  End of study, nl pace , no sob, no desat  - PFTs 08/16/14 FEV1  VC  2.61 (91%) s obst and DLCO 57% corrects to 92  - no obst  Whatever this process is does not appear to be progressive at this point so rec f/u q 6 m and empirical rx for gerd based on  Use of PPI is associated with improved survival time and with decreased radiologic fibrosis per King's study published in [facility] vol 184 p1390.  Dec 2011

## 2014-08-17 NOTE — Assessment & Plan Note (Signed)
1st reported to have R pleual plque dating back to 2004 (xrays not available) so likely remote pna/ parapneumonic process   Unlikely he has significant asbestosis lung dz

## 2014-08-21 ENCOUNTER — Encounter (HOSPITAL_COMMUNITY): Payer: Self-pay | Admitting: Internal Medicine

## 2014-09-04 ENCOUNTER — Other Ambulatory Visit: Payer: Self-pay | Admitting: Interventional Cardiology

## 2014-09-26 ENCOUNTER — Other Ambulatory Visit: Payer: Self-pay | Admitting: *Deleted

## 2014-09-26 MED ORDER — APIXABAN 2.5 MG PO TABS: 2.5000 mg | ORAL_TABLET | Freq: Two times a day (BID) | ORAL | Status: DC

## 2014-10-10 ENCOUNTER — Other Ambulatory Visit: Payer: Self-pay | Admitting: Interventional Cardiology

## 2014-10-22 ENCOUNTER — Encounter: Payer: Self-pay | Admitting: Internal Medicine

## 2014-10-22 ENCOUNTER — Ambulatory Visit (INDEPENDENT_AMBULATORY_CARE_PROVIDER_SITE_OTHER): Payer: Medicare Other | Admitting: Internal Medicine

## 2014-10-22 VITALS — BP 114/66 | HR 54 | Ht 64.0 in | Wt 139.6 lb

## 2014-10-22 DIAGNOSIS — Z950 Presence of cardiac pacemaker: Secondary | ICD-10-CM

## 2014-10-22 DIAGNOSIS — I495 Sick sinus syndrome: Secondary | ICD-10-CM

## 2014-10-22 DIAGNOSIS — I4892 Unspecified atrial flutter: Secondary | ICD-10-CM

## 2014-10-22 LAB — MDC_IDC_ENUM_SESS_TYPE_INCLINIC
Battery Remaining Longevity: 7
Brady Statistic RA Percent Paced: 7 %
Brady Statistic RV Percent Paced: 7 %
Lead Channel Impedance Value: 606 Ohm
Lead Channel Pacing Threshold Amplitude: 0.6 V
Lead Channel Pacing Threshold Pulse Width: 0.4 ms
Lead Channel Sensing Intrinsic Amplitude: 25 mV
Lead Channel Sensing Intrinsic Amplitude: 3.8 mV
Lead Channel Setting Pacing Amplitude: 1.1 V
Lead Channel Setting Pacing Amplitude: 2 V
MDC IDC MSMT LEADCHNL RA PACING THRESHOLD AMPLITUDE: 0.8 V
MDC IDC MSMT LEADCHNL RA PACING THRESHOLD PULSEWIDTH: 0.4 ms
MDC IDC MSMT LEADCHNL RV IMPEDANCE VALUE: 805 Ohm
MDC IDC PG SERIAL: 118262
MDC IDC SET LEADCHNL RV PACING PULSEWIDTH: 0.4 ms
MDC IDC SET LEADCHNL RV SENSING SENSITIVITY: 2.5 mV
Zone Setting Detection Interval: 375 ms

## 2014-10-22 NOTE — Patient Instructions (Addendum)
Your physician recommends that you continue on your current medications as directed. Please refer to the Current Medication list given to you today. Your physician wants you to follow-up in: 1 year with Dr. Lovena Le.  You will receive a reminder letter in the mail two months in advance. If you don't receive a letter, please call our office to schedule the follow-up appointment. Remote monitoring is used to monitor your Pacemaker of ICD from home. This monitoring reduces the number of office visits required to check your device to one time per year. It allows [place] to keep an eye on the functioning of your device to ensure it is working properly. You are scheduled for a device check from home on 01/21/15. You may send your transmission at any time that day. If you have a wireless device, the transmission will be sent automatically. After your physician reviews your transmission, you will receive a postcard with your next transmission date.

## 2014-10-22 NOTE — Assessment & Plan Note (Signed)
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 Will recheck in several months.

## 2014-10-22 NOTE — Assessment & Plan Note (Signed)
He has maintained NSR over 99.9% of the time. He will continue his current meds.

## 2014-10-22 NOTE — Assessment & Plan Note (Signed)
He has a h/o MI. He denies anginal symptoms.
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
## 2014-11-11 ENCOUNTER — Encounter: Payer: Self-pay | Admitting: Nurse Practitioner

## 2014-11-11 ENCOUNTER — Ambulatory Visit (INDEPENDENT_AMBULATORY_CARE_PROVIDER_SITE_OTHER): Payer: Medicare Other | Admitting: Nurse Practitioner

## 2014-11-11 ENCOUNTER — Other Ambulatory Visit: Payer: Medicare Other

## 2014-11-11 VITALS — BP 110/70 | HR 65 | Temp 97.5°F | Ht 64.0 in | Wt 138.0 lb

## 2014-11-11 DIAGNOSIS — J029 Acute pharyngitis, unspecified: Secondary | ICD-10-CM

## 2014-11-11 LAB — POCT RAPID STREP A (OFFICE): RAPID STREP A SCREEN: NEGATIVE

## 2014-11-11 NOTE — Patient Instructions (Signed)
This is likely a viral sore throat/upper respiratory infection. However, if your culture comes back growing bacteria, I will call in an antibiotic. In the meantime, you may use benzocaine throat lozenges or throat spray for comfort. Salt water gargles (1/4 cup warm water mixed with 1/4 tsp salt) twice daily & listerene gargles twice daily. If you develop runny nose, start Neilmed sinus rinses daily.  Rest, sip fluids every hour.  Feel better!  Sore Throat A sore throat is pain, burning, irritation, or scratchiness of the throat. There is often pain or tenderness when swallowing or talking. A sore throat may be accompanied by other symptoms, such as coughing, sneezing, fever, and swollen neck glands. A sore throat is often the first sign of another sickness, such as a cold, flu, strep throat, or mononucleosis (commonly known as mono). Most sore throats go away without medical treatment. CAUSES  The most common causes of a sore throat include:  A viral infection, such as a cold, flu, or mono.  A bacterial infection, such as strep throat, tonsillitis, or whooping cough.  Seasonal allergies.  Dryness in the air.  Irritants, such as smoke or pollution.  Gastroesophageal reflux disease (GERD). HOME CARE INSTRUCTIONS   Only take over-the-counter medicines as directed by your caregiver.  Drink enough fluids to keep your urine clear or pale yellow.  Rest as needed.  Try using throat sprays, lozenges, or sucking on hard candy to ease any pain (if older than 4 years or as directed).  Sip warm liquids, such as broth, herbal tea, or warm water with honey to relieve pain temporarily. You may also eat or drink cold or frozen liquids such as frozen ice pops.  Gargle with salt water (mix 1 tsp salt with 8 oz of water).  Do not smoke and avoid secondhand smoke.  Put a cool-mist humidifier in your bedroom at night to moisten the air. You can also turn on a hot shower and sit in the bathroom with the  door closed for 5 10 minutes. SEEK IMMEDIATE MEDICAL CARE IF:  You have difficulty breathing.  You are unable to swallow fluids, soft foods, or your saliva.  You have increased swelling in the throat.  Your sore throat does not get better in 7 days.  You have nausea and vomiting.  You have a fever or persistent symptoms for more than 2 3 days.  You have a fever and your symptoms suddenly get worse. MAKE SURE YOU:   Understand these instructions.  Will watch your condition.  Will get help right away if you are not doing well or get worse. Document Released: 10/07/2004 Document Revised: 08/16/2012 Document Reviewed: 05/07/2012 Exit[employer] Patient Information 2014 [employer]. 

## 2014-11-11 NOTE — Addendum Note (Signed)
Addended by: Julieta Bellini on: 11/11/2014 03:18 PM   Modules accepted: Miquel Dunn

## 2014-11-11 NOTE — Progress Notes (Signed)
Pre visit review using our clinic review tool, if applicable. No additional management support is needed unless otherwise documented below in the visit note. 

## 2014-11-11 NOTE — Progress Notes (Signed)
   Subjective:    Patient ID: Shaun James, male    DOB: 1931/08/21, 81 y.o.   MRN: [id number]  Sore Throat  This is a new problem. The current episode started in the past 7 days. The problem has been gradually worsening. Neither side of throat is experiencing more pain than the other. There has been no fever. The pain is moderate. Associated symptoms include congestion (more "mucous in throat than usual"), coughing (no change from baseline cough) and headaches. Pertinent negatives include no abdominal pain, diarrhea, ear pain, hoarse voice, plugged ear sensation, shortness of breath, swollen glands or trouble swallowing. He has had no exposure to strep. He has tried acetaminophen for the symptoms. The treatment provided moderate (helps HA, not throat. chloraseptic "burns" throat) relief.      Review of Systems  Constitutional: Negative for fever, chills and fatigue.  HENT: Positive for congestion (more "mucous in throat than usual") and postnasal drip. Negative for ear pain, hoarse voice and trouble swallowing.   Respiratory: Positive for cough (no change from baseline cough). Negative for chest tightness, shortness of breath and wheezing.   Cardiovascular: Negative for chest pain.  Gastrointestinal: Negative for abdominal pain and diarrhea.  Neurological: Positive for headaches.       Objective:   Physical Exam  Constitutional: He is oriented to person, place, and time. He appears well-developed and well-nourished. No distress.  HENT:  Head: Normocephalic and atraumatic.  Right Ear: External ear normal.  Left Ear: External ear normal.  Mouth/Throat: No oropharyngeal exudate.  Posterior pharynx mildly erythematous  Eyes: Conjunctivae are normal. Right eye exhibits no discharge. Left eye exhibits no discharge.  Cardiovascular: Normal rate and regular rhythm.   Murmur heard. Pulmonary/Chest: Effort normal.  Diminished BS throughout   Neurological: He is alert and oriented to person,  place, and time.  Skin: Skin is warm and dry.  Psychiatric: He has a normal mood and affect. His behavior is normal. Thought content normal.  Vitals reviewed.         Assessment & Plan:  1. Sore throat Likely viral, symptom management - Upper Respiratory Culture-pending - POCT rapid strep A-Neg F/u PRN

## 2014-11-13 ENCOUNTER — Telehealth: Payer: Self-pay | Admitting: Nurse Practitioner

## 2014-11-13 NOTE — Telephone Encounter (Signed)
LMOVM for pt to return call 

## 2014-11-13 NOTE — Telephone Encounter (Signed)
pls call pt: Strep culture neg. Viral respiratory/sore throat illness. Continue with comfort measures as discussed. 

## 2014-11-14 LAB — CULTURE, UPPER RESPIRATORY: Organism ID, Bacteria: NORMAL

## 2014-11-14 NOTE — Telephone Encounter (Signed)
Brandi notified pt of results.

## 2014-11-26 ENCOUNTER — Ambulatory Visit (INDEPENDENT_AMBULATORY_CARE_PROVIDER_SITE_OTHER): Payer: Medicare Other | Admitting: Internal Medicine

## 2014-11-26 ENCOUNTER — Encounter: Payer: Self-pay | Admitting: Internal Medicine

## 2014-11-26 ENCOUNTER — Other Ambulatory Visit (INDEPENDENT_AMBULATORY_CARE_PROVIDER_SITE_OTHER): Payer: Medicare Other

## 2014-11-26 ENCOUNTER — Ambulatory Visit (INDEPENDENT_AMBULATORY_CARE_PROVIDER_SITE_OTHER)
Admission: RE | Admit: 2014-11-26 | Discharge: 2014-11-26 | Disposition: A | Discharge status: Home / Self Care | Payer: Medicare Other | Source: Ambulatory Visit | Attending: Internal Medicine | Admitting: Internal Medicine

## 2014-11-26 VITALS — BP 112/72 | HR 55 | Temp 97.5°F | Ht 64.0 in | Wt 137.0 lb

## 2014-11-26 DIAGNOSIS — J441 Chronic obstructive pulmonary disease with (acute) exacerbation: Secondary | ICD-10-CM

## 2014-11-26 DIAGNOSIS — Z8709 Personal history of other diseases of the respiratory system: Secondary | ICD-10-CM

## 2014-11-26 DIAGNOSIS — J929 Pleural plaque without asbestos: Secondary | ICD-10-CM

## 2014-11-26 LAB — CBC WITH DIFFERENTIAL/PLATELET
BASOS PCT: 0.3 % (ref 0.0–3.0)
Basophils Absolute: 0 10*3/uL (ref 0.0–0.1)
EOS ABS: 0.1 10*3/uL (ref 0.0–0.7)
EOS PCT: 2.1 % (ref 0.0–5.0)
HEMATOCRIT: 41.3 % (ref 39.0–52.0)
HEMOGLOBIN: 14.2 g/dL (ref 13.0–17.0)
LYMPHS ABS: 1.6 10*3/uL (ref 0.7–4.0)
Lymphocytes Relative: 25.3 % (ref 12.0–46.0)
MCHC: 34.5 g/dL (ref 30.0–36.0)
MCV: 91.3 fl (ref 78.0–100.0)
MONO ABS: 0.6 10*3/uL (ref 0.1–1.0)
Monocytes Relative: 10.1 % (ref 3.0–12.0)
NEUTROS ABS: 4 10*3/uL (ref 1.4–7.7)
Neutrophils Relative %: 62.2 % (ref 43.0–77.0)
Platelets: 240 10*3/uL (ref 150.0–400.0)
RBC: 4.52 Mil/uL (ref 4.22–5.81)
RDW: 13.6 % (ref 11.5–15.5)
WBC: 6.4 10*3/uL (ref 4.0–10.5)

## 2014-11-26 MED ORDER — DOXYCYCLINE HYCLATE 100 MG PO TABS: 100.0000 mg | ORAL_TABLET | Freq: Two times a day (BID) | ORAL | Status: DC

## 2014-11-26 NOTE — Progress Notes (Signed)
Pre visit review using our clinic review tool, if applicable. No additional management support is needed unless otherwise documented below in the visit note. 

## 2014-11-26 NOTE — Patient Instructions (Signed)
  Your next office appointment will be determined based upon review of your pending labs &  x-rays. Those instructions will be transmitted to you by mail  Critical values will be called. Followup as needed for any active or acute issue. Please report any significant change in your symptoms. 
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
## 2014-12-05 ENCOUNTER — Other Ambulatory Visit: Payer: Self-pay | Admitting: Interventional Cardiology

## 2014-12-21 ENCOUNTER — Other Ambulatory Visit: Payer: Self-pay | Admitting: Interventional Cardiology

## 2014-12-31 ENCOUNTER — Other Ambulatory Visit: Payer: Self-pay

## 2014-12-31 MED ORDER — RANOLAZINE ER 500 MG PO TB12: 500.0000 mg | ORAL_TABLET | Freq: Two times a day (BID) | ORAL | Status: DC

## 2015-01-21 ENCOUNTER — Encounter: Payer: Medicare Other | Admitting: *Deleted

## 2015-01-21 ENCOUNTER — Telehealth: Payer: Self-pay | Admitting: Cardiology

## 2015-01-21 NOTE — Telephone Encounter (Signed)
LMOVM reminding pt to send remote transmission.   

## 2015-01-24 ENCOUNTER — Encounter: Payer: Self-pay | Admitting: Cardiology

## 2015-02-16 ENCOUNTER — Other Ambulatory Visit: Payer: Self-pay | Admitting: Interventional Cardiology

## 2015-02-17 NOTE — Telephone Encounter (Signed)
Most recent lipid panel done by pcp. Ok to refill? Please advise. Thanks, MI

## 2015-02-24 ENCOUNTER — Ambulatory Visit (INDEPENDENT_AMBULATORY_CARE_PROVIDER_SITE_OTHER): Payer: Medicare Other | Admitting: Internal Medicine

## 2015-02-24 ENCOUNTER — Encounter: Payer: Self-pay | Admitting: Internal Medicine

## 2015-02-24 VITALS — BP 128/72 | HR 57 | Temp 97.7°F | Wt 135.5 lb

## 2015-02-24 DIAGNOSIS — J209 Acute bronchitis, unspecified: Secondary | ICD-10-CM

## 2015-02-24 DIAGNOSIS — R110 Nausea: Secondary | ICD-10-CM

## 2015-02-24 MED ORDER — AZITHROMYCIN 250 MG PO TABS: ORAL_TABLET | ORAL | Status: DC

## 2015-02-24 MED ORDER — BENZONATATE 100 MG PO CAPS: 100.0000 mg | ORAL_CAPSULE | Freq: Two times a day (BID) | ORAL | Status: DC | PRN

## 2015-02-24 NOTE — Progress Notes (Signed)
   Subjective:    Patient ID: Shaun James, male    DOB: 1931/02/08, 81 y.o.   MRN: [id number]  HPI  Symptoms began 02/21/15 as sore throat. He also had nausea which he related to ingesting soybeans which apparently had been cooked and then fermented.  Chart lists Pepcid and protonix; but he is unsure whether he is taking these or not.  As of 6/11 he had developed a nonproductive cough and low-grade fever. He had some generalized headache which responded to Tylenol.  Other than nausea he has no other active GI symptoms. He has no upper respiratory tract infection symptoms  Review of Systems Unexplained weight loss, abdominal pain, significant dyspepsia, dysphagia, melena, rectal bleeding, or persistently small caliber stools are denied. Facial pain , nasal purulence, dental pain,  otic pain or otic discharge denied. No fever , chills or sweats.     Objective:   Physical Exam Pertinent or positive findings include: Pattern alopecia is present. There is marked loss of the eyebrows laterally. He has hearing aids bilaterally but still is markedly hearing impaired. The nasal septum is dry & deviated to the right. He has a grade 1 systolic murmur. Gait is broad and slightly unsteady   General appearance :adequately nourished; in no distress.  Eyes: No conjunctival inflammation or scleral icterus is present.  Oral exam:  Lips and gums are healthy appearing.There is no oropharyngeal erythema or exudate noted. Dental hygiene is good.  Heart:  Normal rate and regular rhythm. S1 and S2 normal without gallop, click, rub or other extra sounds    Lungs:Chest clear to auscultation; no wheezes, rhonchi,rales ,or rubs present.No increased work of breathing.   Abdomen: bowel sounds normal, soft and non-tender without masses, organomegaly or hernias noted.  No guarding or rebound. No flank tenderness to percussion.  Vascular : all pulses equal ; no bruits present.  Skin:Warm & dry.  Intact without  suspicious lesions or rashes ; no tenting or jaundice   Lymphatic: No lymphadenopathy is noted about the head, neck, axilla  Neuro: Strength, tone decreased         Assessment & Plan:  #1 acute bronchitis  #2 nausea  #3 indefinite medical regimen  Plan: See orders recommendations

## 2015-02-24 NOTE — Patient Instructions (Addendum)
   If you do not have Pepcid 20 mg; take Prilosec OTC 20 mg 30 minutes before breakfast. Stay on clear liquids for 48-72 hours or until bowels are normal.This would include  jello, sherbert (NOT ice cream), Lipton's chicken noodle soup(NOT cream based soups),Gatorade Lite, flat Ginger ale (without High Fructose Corn Syrup),dry toast or crackers, baked potato.No milk , dairy or grease until bowels are formed. Align OR Florastor , a [employer] , daily if stools are loose. Immodium AD for frankly watery stool. Report increasing pain, fever or rectal bleeding.  Please bring your actual pill bottles to every office visit to verify the exact medication you are taking. This is critical.

## 2015-02-24 NOTE — Progress Notes (Signed)
Pre visit review using our clinic review tool, if applicable. No additional management support is needed unless otherwise documented below in the visit note. 

## 2015-02-25 ENCOUNTER — Telehealth: Payer: Self-pay

## 2015-02-25 NOTE — Telephone Encounter (Signed)
Call to Shaun James. Had difficulty hearing over the phone. Asked to speak to dtr in law. Dtr in law interpreted for the patient, but stated he can understand English; just can't hear well over the phone  Explained wellness apt; will bring medications; although states he is taking everything he is "suppose" to take  Apt made for Friday 4/17 at 8am

## 2015-02-28 ENCOUNTER — Ambulatory Visit (INDEPENDENT_AMBULATORY_CARE_PROVIDER_SITE_OTHER): Payer: Medicare Other

## 2015-02-28 ENCOUNTER — Other Ambulatory Visit: Payer: Self-pay

## 2015-02-28 VITALS — BP 122/60 | Ht 64.0 in | Wt 135.0 lb

## 2015-02-28 DIAGNOSIS — Z23 Encounter for immunization: Secondary | ICD-10-CM | POA: Diagnosis not present

## 2015-02-28 DIAGNOSIS — Z0000 Encounter for general adult medical examination without abnormal findings: Secondary | ICD-10-CM

## 2015-02-28 MED ORDER — NITROGLYCERIN 0.4 MG SL SUBL: 0.4000 mg | SUBLINGUAL_TABLET | SUBLINGUAL | Status: DC | PRN

## 2015-02-28 MED ORDER — ZOSTER VACCINE LIVE 19400 UNT/0.65ML ~~LOC~~ SOLR: 0.6500 mL | Freq: Once | SUBCUTANEOUS | Status: DC

## 2015-02-28 NOTE — Patient Instructions (Addendum)
Shaun James , Thank you for taking time to come for your Medicare Wellness Visit. I appreciate your ongoing commitment to your health goals. Please review the following plan we discussed and let me know if I can assist you in the future.   Agrees to take prevnar today Agrees to take shingles;  Controllable  risk for heart disease reviewed for goal setting: Includes; family hx; HTN; decreased renal function; obesity; sedentary lifestyle Educated regarding Metabolic syndrome / Factors that increase risk for Heart Disease: Heart Healthy Diet; less sugar  Excess body fat around the waist Waist circumference women >35 Waist circumference for Men >40  Triglycerides > 150 HDL < 50 BP > 130/85 Glucose > 100 Goals are determined by the physician and based on other relevant data and risk Plan Lifestyle changes  BMI reviewed   Risk Calculator: [email or web address]  BP Education: <120/80; any elevation will warrant further checks  BP can be elevated by lack of quality sleep OTC cold medications  Hormone Therapy post menopause  Eating to much sodium Processed foods (daily intake 2500mg  or one teaspoon of salt) Drinking to much alcohol  Smoking Lack of physical activity   These are the goals we discussed: Goals    . keep working     States his hobby is working; He loves his work and goes to the office every day Does use treadmill x 30 minutes; qod 3 to 4 days a week       This is a list of the screening recommended for you and due dates:  Health Maintenance  Topic Date Due  . Shingles Vaccine  04/13/1993  . Pneumonia vaccines (2 of 2 - PCV13) 06/14/2013  . Flu Shot  04/14/2015  . Tetanus Vaccine  09/13/2018  . Colon Cancer Screening  12/20/2020       Fall Prevention and Home Safety Falls cause injuries and can affect all age groups. It is possible to prevent falls.  HOW TO PREVENT FALLS  Wear shoes with rubber soles that do not have an opening for your  toes.  Keep the inside and outside of your house well lit.  Use night lights throughout your home.  Remove clutter from floors.  Clean up floor spills.  Remove throw rugs or fasten them to the floor with carpet tape.  Do not place electrical cords across pathways.  Put grab bars by your tub, shower, and toilet. Do not use towel bars as grab bars.  Put handrails on both sides of the stairway. Fix loose handrails.  Do not climb on stools or stepladders, if possible.  Do not wax your floors.  Repair uneven or unsafe sidewalks, walkways, or stairs.  Keep items you use a lot within reach.  Be aware of pets.  Keep emergency numbers next to the telephone.  Put smoke detectors in your home and near bedrooms. Ask your doctor what other things you can do to prevent falls. Document Released: 06/26/2009 Document Revised: 02/29/2012 Document Reviewed: 11/30/2011 [facility] Patient Information 2015 [place], [place]. This information is not intended to replace advice given to you by your health care provider. Make sure you discuss any questions you have with your health care provider.  Health Maintenance A healthy lifestyle and preventative care can promote health and wellness.  Maintain regular health, dental, and eye exams.  Eat a healthy diet. Foods like vegetables, fruits, whole grains, low-fat dairy products, and lean protein foods contain the nutrients you need and are low in calories. Decrease your intake  of foods high in solid fats, added sugars, and salt. Get information about a proper diet from your health care provider, if necessary.  Regular physical exercise is one of the most important things you can do for your health. Most adults should get at least 150 minutes of moderate-intensity exercise (any activity that increases your heart rate and causes you to sweat) each week. In addition, most adults need muscle-strengthening exercises on 2 or more days a week.   Maintain a  healthy weight. The body mass index (BMI) is a screening tool to identify possible weight problems. It provides an estimate of body fat based on height and weight. Your health care provider can find your BMI and can help you achieve or maintain a healthy weight. For males 20 years and older:  A BMI below 18.5 is considered underweight.  A BMI of 18.5 to 24.9 is normal.  A BMI of 25 to 29.9 is considered overweight.  A BMI of 30 and above is considered obese.  Maintain normal blood lipids and cholesterol by exercising and minimizing your intake of saturated fat. Eat a balanced diet with plenty of fruits and vegetables. Blood tests for lipids and cholesterol should begin at age 57 and be repeated every 5 years. If your lipid or cholesterol levels are high, you are over age 55, or you are at high risk for heart disease, you may need your cholesterol levels checked more frequently.Ongoing high lipid and cholesterol levels should be treated with medicines if diet and exercise are not working.  If you smoke, find out from your health care provider how to quit. If you do not use tobacco, do not start.  Lung cancer screening is recommended for adults aged 42-80 years who are at high risk for developing lung cancer because of a history of smoking. A yearly low-dose CT scan of the lungs is recommended for people who have at least a 30-pack-year history of smoking and are current smokers or have quit within the past 15 years. A pack year of smoking is smoking an average of 1 pack of cigarettes a day for 1 year (for example, a 30-pack-year history of smoking could mean smoking 1 pack a day for 30 years or 2 packs a day for 15 years). Yearly screening should continue until the smoker has stopped smoking for at least 15 years. Yearly screening should be stopped for people who develop a health problem that would prevent them from having lung cancer treatment.  If you choose to drink alcohol, do not have more than  2 drinks per day. One drink is considered to be 12 oz (360 mL) of beer, 5 oz (150 mL) of wine, or 1.5 oz (45 mL) of liquor.  Avoid the use of street drugs. Do not share needles with anyone. Ask for help if you need support or instructions about stopping the use of drugs.  High blood pressure causes heart disease and increases the risk of stroke. Blood pressure should be checked at least every 1-2 years. Ongoing high blood pressure should be treated with medicines if weight loss and exercise are not effective.  If you are 76-22 years old, ask your health care provider if you should take aspirin to prevent heart disease.  Diabetes screening involves taking a blood sample to check your fasting blood sugar level. This should be done once every 3 years after age 79 if you are at a normal weight and without risk factors for diabetes. Testing should be considered  at a younger age or be carried out more frequently if you are overweight and have at least 1 risk factor for diabetes.  Colorectal cancer can be detected and often prevented. Most routine colorectal cancer screening begins at the age of 48 and continues through age 63. However, your health care provider may recommend screening at an earlier age if you have risk factors for colon cancer. On a yearly basis, your health care provider may provide home test kits to check for hidden blood in the stool. A small camera at the end of a tube may be used to directly examine the colon (sigmoidoscopy or colonoscopy) to detect the earliest forms of colorectal cancer. Talk to your health care provider about this at age 93 when routine screening begins. A direct exam of the colon should be repeated every 5-10 years through age 67, unless early forms of precancerous polyps or small growths are found.  People who are at an increased risk for hepatitis B should be screened for this virus. You are considered at high risk for hepatitis B if:  You were born in a country  where hepatitis B occurs often. Talk with your health care provider about which countries are considered high risk.  Your parents were born in a high-risk country and you have not received a shot to protect against hepatitis B (hepatitis B vaccine).  You have HIV or AIDS.  You use needles to inject street drugs.  You live with, or have sex with, someone who has hepatitis B.  You are a man who has sex with other men (MSM).  You get hemodialysis treatment.  You take certain medicines for conditions like cancer, organ transplantation, and autoimmune conditions.  Hepatitis C blood testing is recommended for all people born from [birth year] through 1963 and any individual with known risk factors for hepatitis C.  Healthy men should no longer receive prostate-specific antigen (PSA) blood tests as part of routine cancer screening. Talk to your health care provider about prostate cancer screening.  Testicular cancer screening is not recommended for adolescents or adult males who have no symptoms. Screening includes self-exam, a health care provider exam, and other screening tests. Consult with your health care provider about any symptoms you have or any concerns you have about testicular cancer.  Practice safe sex. Use condoms and avoid high-risk sexual practices to reduce the spread of sexually transmitted infections (STIs).  You should be screened for STIs, including gonorrhea and chlamydia if:  You are sexually active and are younger than 24 years.  You are older than 24 years, and your health care provider tells you that you are at risk for this type of infection.  Your sexual activity has changed since you were last screened, and you are at an increased risk for chlamydia or gonorrhea. Ask your health care provider if you are at risk.  If you are at risk of being infected with HIV, it is recommended that you take a prescription medicine daily to prevent HIV infection. This is called  pre-exposure prophylaxis (PrEP). You are considered at risk if:  You are a man who has sex with other men (MSM).  You are a heterosexual man who is sexually active with multiple partners.  You take drugs by injection.  You are sexually active with a partner who has HIV.  Talk with your health care provider about whether you are at high risk of being infected with HIV. If you choose to begin PrEP, you should first  be tested for HIV. You should then be tested every 3 months for as long as you are taking PrEP.  Use sunscreen. Apply sunscreen liberally and repeatedly throughout the day. You should seek shade when your shadow is shorter than you. Protect yourself by wearing long sleeves, pants, a wide-brimmed hat, and sunglasses year round whenever you are outdoors.  Tell your health care provider of new moles or changes in moles, especially if there is a change in shape or color. Also, tell your health care provider if a mole is larger than the size of a pencil eraser.  A one-time screening for abdominal aortic aneurysm (AAA) and surgical repair of large AAAs by ultrasound is recommended for men aged 74-75 years who are current or former smokers.  Stay current with your vaccines (immunizations). Document Released: 02/26/2008 Document Revised: 09/04/2013 Document Reviewed: 01/25/2011 [facility] Patient Information 2015 [place], [place]. This information is not intended to replace advice given to you by your health care provider. Make sure you discuss any questions you have with your health care provider.
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
## 2015-03-02 NOTE — Progress Notes (Signed)
Medicare Wellness exam completed by Vicente Serene to update chart in reference to status of needed immunizations, imaging, and procedures as part of the preventive health care program reviewed.

## 2015-03-10 ENCOUNTER — Other Ambulatory Visit: Payer: Self-pay | Admitting: Internal Medicine

## 2015-03-10 DIAGNOSIS — R0600 Dyspnea, unspecified: Secondary | ICD-10-CM

## 2015-03-11 ENCOUNTER — Ambulatory Visit (INDEPENDENT_AMBULATORY_CARE_PROVIDER_SITE_OTHER): Payer: Medicare Other | Admitting: Internal Medicine

## 2015-03-11 ENCOUNTER — Encounter: Payer: Self-pay | Admitting: Internal Medicine

## 2015-03-11 VITALS — BP 118/60 | HR 55 | Ht 64.0 in | Wt 139.0 lb

## 2015-03-11 DIAGNOSIS — J8410 Pulmonary fibrosis, unspecified: Secondary | ICD-10-CM | POA: Diagnosis not present

## 2015-03-11 DIAGNOSIS — R0600 Dyspnea, unspecified: Secondary | ICD-10-CM | POA: Diagnosis not present

## 2015-03-11 LAB — PULMONARY FUNCTION TEST
DL/VA % PRED: 95 %
DL/VA: 3.96 ml/min/mmHg/L
DLCO UNC % PRED: 63 %
DLCO unc: 15.32 ml/min/mmHg
FEF 25-75 Post: 2.71 L/sec
FEF 25-75 Pre: 2.58 L/sec
FEF2575-%CHANGE-POST: 5 %
FEF2575-%PRED-PRE: 161 %
FEF2575-%Pred-Post: 169 %
FEV1-%Change-Post: 0 %
FEV1-%PRED-POST: 108 %
FEV1-%Pred-Pre: 108 %
FEV1-POST: 2.34 L
FEV1-Pre: 2.34 L
FEV1FVC-%CHANGE-POST: 1 %
FEV1FVC-%Pred-Pre: 108 %
FEV6-%CHANGE-POST: -1 %
FEV6-PRE: 2.82 L
FEV6-Post: 2.78 L
FEV6FVC-%CHANGE-POST: 0 %
FVC-%Change-Post: -1 %
FVC-%PRED-PRE: 99 %
FVC-%Pred-Post: 98 %
FVC-PRE: 2.84 L
FVC-Post: 2.8 L
PRE FEV6/FVC RATIO: 99 %
Post FEV1/FVC ratio: 84 %
Post FEV6/FVC ratio: 99 %
Pre FEV1/FVC ratio: 82 %
RV % pred: 49 %
RV: 1.17 L
TLC % PRED: 64 %
TLC: 3.78 L

## 2015-03-11 NOTE — Progress Notes (Signed)
PFT done today. 
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
## 2015-03-11 NOTE — Patient Instructions (Addendum)
pepcid 20 mg one at bedtime (over the counter ok)  If start coughing again for any reason take prilosec otc  20 mg x 30-60 min before first meal of the day   GERD (REFLUX)  is an extremely common cause of respiratory symptoms just like yours , many times with no obvious heartburn at all.    It can be treated with medication, but also with lifestyle changes including elevation of the head of your bed (ideally with 6 inch  bed blocks),  Smoking cessation, avoidance of late meals, excessive alcohol, and avoid fatty foods, chocolate, peppermint, colas, red wine, and acidic juices such as orange juice.  NO MINT OR MENTHOL PRODUCTS SO NO COUGH DROPS  USE SUGARLESS CANDY INSTEAD (Jolley ranchers or Stover's or Life Savers) or even ice chips will also do - the key is to swallow to prevent all throat clearing. NO OIL BASED VITAMINS - use powdered substitutes.   Please schedule a follow up office visit in 6 months, call sooner if needed
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
## 2015-03-13 ENCOUNTER — Encounter: Payer: Self-pay | Admitting: Interventional Cardiology

## 2015-03-13 ENCOUNTER — Ambulatory Visit (INDEPENDENT_AMBULATORY_CARE_PROVIDER_SITE_OTHER): Payer: Medicare Other | Admitting: Interventional Cardiology

## 2015-03-13 VITALS — BP 130/86 | HR 62 | Ht 64.0 in | Wt 135.8 lb

## 2015-03-13 DIAGNOSIS — I25719 Atherosclerosis of autologous vein coronary artery bypass graft(s) with unspecified angina pectoris: Secondary | ICD-10-CM

## 2015-03-13 DIAGNOSIS — I495 Sick sinus syndrome: Secondary | ICD-10-CM

## 2015-03-13 DIAGNOSIS — E785 Hyperlipidemia, unspecified: Secondary | ICD-10-CM

## 2015-03-13 DIAGNOSIS — J8410 Pulmonary fibrosis, unspecified: Secondary | ICD-10-CM

## 2015-03-13 DIAGNOSIS — I4892 Unspecified atrial flutter: Secondary | ICD-10-CM | POA: Diagnosis not present

## 2015-03-13 DIAGNOSIS — Z950 Presence of cardiac pacemaker: Secondary | ICD-10-CM

## 2015-03-13 MED ORDER — RANOLAZINE ER 500 MG PO TB12: 500.0000 mg | ORAL_TABLET | Freq: Two times a day (BID) | ORAL | Status: DC

## 2015-03-13 NOTE — Patient Instructions (Signed)

## 2015-03-14 ENCOUNTER — Encounter: Payer: 59 | Admitting: Internal Medicine

## 2015-03-16 ENCOUNTER — Encounter: Payer: Self-pay | Admitting: Internal Medicine

## 2015-03-16 NOTE — Assessment & Plan Note (Addendum)
12/29/2011 Office Visit Written 12/29/2011 6:03 PM by Rigoberto Noel, MD   No obvious pulmonary cause for his dyspnea. There is no airway obstruction on spirometry which rules out COPD    07/18/2014  Walked RA  2 laps @ 185 ft each stopped due to  Unstable on feet, mod  pace, no desat or sob  HRCT 07/31/14 Pulmonary parenchymal pattern of interstitial lung disease is indicative of nonspecific interstitial pneumonitis (NSIP) or possibly usual interstitial pneumonitis (UIP). Asbestosis is considered less likely, given the unilateral nature of the calcified pleural plaques, but is not excluded 08/02/2014  Walked RA x 3 laps @ 185 ft each stopped due to  End of study, nl pace , no sob, no desat  - PFTs 08/16/14 FEV1  VC  2.61 (91%) s obst and DLCO 57% corrects to 92  - no obst - PFTs 03/11/2015        VC 2.60 (91%) s obst and dlco     63% corrects to 95%   I had an extended discussion with the patient reviewing all relevant studies completed to date and  lasting 15 to 20 minutes of a 25 minute visit on the following ongoing concerns:  1) he stopped the gerd rx as he's not convinced it is a source of any his problems  2) reviewed the latest study on GERD/pf:  Use of PPI is associated with improved survival time and with decreased radiologic fibrosis per King's study published in AJRCCM vol 184 p1390.  Dec 2011 This may not be cause and effect, but given how universally unimpressive and expensive  all the other  Drugs developed to day  have been for pf,   rec restart  rx at least hs h2 diet/ lifestyle modification and add ppi ac qam at the first sign of any flare of cough or worse breathing.

## 2015-03-17 ENCOUNTER — Other Ambulatory Visit: Payer: Self-pay | Admitting: Interventional Cardiology

## 2015-03-21 ENCOUNTER — Other Ambulatory Visit: Payer: Self-pay | Admitting: Interventional Cardiology

## 2015-03-21 ENCOUNTER — Other Ambulatory Visit: Payer: Self-pay

## 2015-03-21 MED ORDER — DILTIAZEM HCL ER COATED BEADS 240 MG PO CP24: 240.0000 mg | ORAL_CAPSULE | Freq: Every day | ORAL | Status: DC

## 2015-04-10 ENCOUNTER — Encounter: Payer: Self-pay | Admitting: Internal Medicine

## 2015-04-10 ENCOUNTER — Ambulatory Visit (INDEPENDENT_AMBULATORY_CARE_PROVIDER_SITE_OTHER): Payer: Medicare Other | Admitting: Internal Medicine

## 2015-04-10 VITALS — BP 108/54 | HR 60 | Temp 97.5°F | Ht 64.0 in | Wt 134.8 lb

## 2015-04-10 DIAGNOSIS — N400 Enlarged prostate without lower urinary tract symptoms: Secondary | ICD-10-CM | POA: Insufficient documentation

## 2015-04-10 DIAGNOSIS — R739 Hyperglycemia, unspecified: Secondary | ICD-10-CM

## 2015-04-10 DIAGNOSIS — I4892 Unspecified atrial flutter: Secondary | ICD-10-CM | POA: Diagnosis not present

## 2015-04-10 DIAGNOSIS — Z0000 Encounter for general adult medical examination without abnormal findings: Secondary | ICD-10-CM | POA: Diagnosis not present

## 2015-04-10 DIAGNOSIS — E785 Hyperlipidemia, unspecified: Secondary | ICD-10-CM

## 2015-04-10 MED ORDER — APIXABAN 2.5 MG PO TABS: 2.5000 mg | ORAL_TABLET | Freq: Two times a day (BID) | ORAL | Status: DC

## 2015-04-10 NOTE — Assessment & Plan Note (Signed)
Follows with EP/cardiology for same per last note February 2016, remains in sinus 99% of the time Continue anticoagulation as ongoing. No medication changes recommended

## 2015-04-10 NOTE — Progress Notes (Signed)
Pre visit review using our clinic review tool, if applicable. No additional management support is needed unless otherwise documented below in the visit note. 

## 2015-04-10 NOTE — Assessment & Plan Note (Signed)
On statin Last lipids reviewed The current medical regimen is effective;  continue present plan and medications.

## 2015-04-10 NOTE — Patient Instructions (Addendum)
It was good to see you today.  We have reviewed your prior records including labs and tests today  Health Maintenance reviewed - all recommended immunizations and age-appropriate screenings are up-to-date.  Test(s) ordered today. Return in the morning when you are fasting to do these labs. Your results will be released to [place] (or called to you) after review, usually within 72hours after test completion. If any changes need to be made, you will be notified at that same time.  Medications reviewed and updated, no changes recommended at this time.  Please schedule followup in 12 months for annual exam and labs, call sooner if problems.  Health Maintenance A healthy lifestyle and preventative care can promote health and wellness.  Maintain regular health, dental, and eye exams.  Eat a healthy diet. Foods like vegetables, fruits, whole grains, low-fat dairy products, and lean protein foods contain the nutrients you need and are low in calories. Decrease your intake of foods high in solid fats, added sugars, and salt. Get information about a proper diet from your health care provider, if necessary.  Regular physical exercise is one of the most important things you can do for your health. Most adults should get at least 150 minutes of moderate-intensity exercise (any activity that increases your heart rate and causes you to sweat) each week. In addition, most adults need muscle-strengthening exercises on 2 or more days a week.   Maintain a healthy weight. The body mass index (BMI) is a screening tool to identify possible weight problems. It provides an estimate of body fat based on height and weight. Your health care provider can find your BMI and can help you achieve or maintain a healthy weight. For males 20 years and older:  A BMI below 18.5 is considered underweight.  A BMI of 18.5 to 24.9 is normal.  A BMI of 25 to 29.9 is considered overweight.  A BMI of 30 and above is considered  obese.  Maintain normal blood lipids and cholesterol by exercising and minimizing your intake of saturated fat. Eat a balanced diet with plenty of fruits and vegetables. Blood tests for lipids and cholesterol should begin at age 39 and be repeated every 5 years. If your lipid or cholesterol levels are high, you are over age 14, or you are at high risk for heart disease, you may need your cholesterol levels checked more frequently.Ongoing high lipid and cholesterol levels should be treated with medicines if diet and exercise are not working.  If you smoke, find out from your health care provider how to quit. If you do not use tobacco, do not start.  Lung cancer screening is recommended for adults aged 35-80 years who are at high risk for developing lung cancer because of a history of smoking. A yearly low-dose CT scan of the lungs is recommended for people who have at least a 30-pack-year history of smoking and are current smokers or have quit within the past 15 years. A pack year of smoking is smoking an average of 1 pack of cigarettes a day for 1 year (for example, a 30-pack-year history of smoking could mean smoking 1 pack a day for 30 years or 2 packs a day for 15 years). Yearly screening should continue until the smoker has stopped smoking for at least 15 years. Yearly screening should be stopped for people who develop a health problem that would prevent them from having lung cancer treatment.  If you choose to drink alcohol, do not have more than  2 drinks per day. One drink is considered to be 12 oz (360 mL) of beer, 5 oz (150 mL) of wine, or 1.5 oz (45 mL) of liquor.  Avoid the use of street drugs. Do not share needles with anyone. Ask for help if you need support or instructions about stopping the use of drugs.  High blood pressure causes heart disease and increases the risk of stroke. Blood pressure should be checked at least every 1-2 years. Ongoing high blood pressure should be treated with  medicines if weight loss and exercise are not effective.  If you are 16-51 years old, ask your health care provider if you should take aspirin to prevent heart disease.  Diabetes screening involves taking a blood sample to check your fasting blood sugar level. This should be done once every 3 years after age 70 if you are at a normal weight and without risk factors for diabetes. Testing should be considered at a younger age or be carried out more frequently if you are overweight and have at least 1 risk factor for diabetes.  Colorectal cancer can be detected and often prevented. Most routine colorectal cancer screening begins at the age of 54 and continues through age 74. However, your health care provider may recommend screening at an earlier age if you have risk factors for colon cancer. On a yearly basis, your health care provider may provide home test kits to check for hidden blood in the stool. A small camera at the end of a tube may be used to directly examine the colon (sigmoidoscopy or colonoscopy) to detect the earliest forms of colorectal cancer. Talk to your health care provider about this at age 61 when routine screening begins. A direct exam of the colon should be repeated every 5-10 years through age 40, unless early forms of precancerous polyps or small growths are found.  People who are at an increased risk for hepatitis B should be screened for this virus. You are considered at high risk for hepatitis B if:  You were born in a country where hepatitis B occurs often. Talk with your health care provider about which countries are considered high risk.  Your parents were born in a high-risk country and you have not received a shot to protect against hepatitis B (hepatitis B vaccine).  You have HIV or AIDS.  You use needles to inject street drugs.  You live with, or have sex with, someone who has hepatitis B.  You are a man who has sex with other men (MSM).  You get hemodialysis  treatment.  You take certain medicines for conditions like cancer, organ transplantation, and autoimmune conditions.  Hepatitis C blood testing is recommended for all people born from [birth year] through 1965 and any individual with known risk factors for hepatitis C.  Healthy men should no longer receive prostate-specific antigen (PSA) blood tests as part of routine cancer screening. Talk to your health care provider about prostate cancer screening.  Testicular cancer screening is not recommended for adolescents or adult males who have no symptoms. Screening includes self-exam, a health care provider exam, and other screening tests. Consult with your health care provider about any symptoms you have or any concerns you have about testicular cancer.  Practice safe sex. Use condoms and avoid high-risk sexual practices to reduce the spread of sexually transmitted infections (STIs).  You should be screened for STIs, including gonorrhea and chlamydia if:  You are sexually active and are younger than 24 years.  You  are older than 24 years, and your health care provider tells you that you are at risk for this type of infection.  Your sexual activity has changed since you were last screened, and you are at an increased risk for chlamydia or gonorrhea. Ask your health care provider if you are at risk.  If you are at risk of being infected with HIV, it is recommended that you take a prescription medicine daily to prevent HIV infection. This is called pre-exposure prophylaxis (PrEP). You are considered at risk if:  You are a man who has sex with other men (MSM).  You are a heterosexual man who is sexually active with multiple partners.  You take drugs by injection.  You are sexually active with a partner who has HIV.  Talk with your health care provider about whether you are at high risk of being infected with HIV. If you choose to begin PrEP, you should first be tested for HIV. You should then be tested  every 3 months for as long as you are taking PrEP.  Use sunscreen. Apply sunscreen liberally and repeatedly throughout the day. You should seek shade when your shadow is shorter than you. Protect yourself by wearing long sleeves, pants, a wide-brimmed hat, and sunglasses year round whenever you are outdoors.  Tell your health care provider of new moles or changes in moles, especially if there is a change in shape or color. Also, tell your health care provider if a mole is larger than the size of a pencil eraser.  A one-time screening for abdominal aortic aneurysm (AAA) and surgical repair of large AAAs by ultrasound is recommended for men aged 86-75 years who are current or former smokers.  Stay current with your vaccines (immunizations). Document Released: 02/26/2008 Document Revised: 09/04/2013 Document Reviewed: 01/25/2011 [facility] Patient Information 2015 [place], [place]. This information is not intended to replace advice given to you by your health care provider. Make sure you discuss any questions you have with your health care provider.

## 2015-04-10 NOTE — Progress Notes (Signed)
Subjective:    Patient ID: Shaun James COLT, male    DOB: 1931/05/02, 81 y.o.   MRN: [id number]  HPI   Here for medicare wellness/annual physcial  Diet: heart healthy  Physical activity: Aerobic exercise at gym with trainer 5 times per week Depression/mood screen: negative Hearing: intact with corrective devices bilaterally Visual acuity: grossly normal, performs annual eye exam  ADLs: capable Fall risk: Shaun James Home safety: good Cognitive evaluation: intact to orientation, naming, recall and repetition EOL planning: adv directives, full code/ I agree  I have personally reviewed and have noted 1. The patient's medical and social history 2. Their use of alcohol, tobacco or illicit drugs 3. Their current medications and supplements 4. The patient's functional ability including ADL's, fall risks, home safety risks and hearing or visual impairment. 5. Diet and physical activities 6. Evidence for depression or mood disorders  Also reviewed chronic medical conditions, interval events; denies current concerns  Past Medical History  Diagnosis Date  . CAD in native artery     a) s/p CABG '98; LIMA-LAD, SVG-D1, SVG-OM1, SVG-PDA); b) CATH -2009: midLAD & RCA 100%, OM2 100% wtih severe native Cx; Grafts patent with ~30-50% SVG-D1 & ~30-40% SVG-OM, EF 45%; c) Lexiscan Cardiolite 02/2014: EF 61%, No Ischemia; subtle fixed anteroseptal defect.  . Myocardial infarction   . Atrial flutter   . SSS (sick sinus syndrome)     syncope, s/p ppm 12/12  . Arthritis   . Allergic rhinitis   . Osteoarthritis 06/14/2012  . Prostatitis dx 12/2102    E coli Ucx  . Inflamed seborrheic keratosis   . Basal cell carcinoma   . Personal history of colonic polyps   . Essential hypertension, benign   . Coronary atherosclerosis of native coronary artery   . Esophageal reflux   . Peptic ulcer, unspecified site, unspecified as acute or chronic, without mention of hemorrhage, perforation, or obstruction   .  Diverticulosis of colon (without mention of hemorrhage)   . Mixed hyperlipidemia   . Near syncope     uncertain cause. R/O arrhythmia, R/O med effect  . Cardiac pacemaker in situ     [employer]  . Systolic murmur     Worrisome for AS. AS could cause exertional fatigue.  . Atrial fibrillation     chronic anticoag  . Benign positional vertigo   . GERD (gastroesophageal reflux disease)   . Intermittent confusion   . Presbycusis of both ears     Bilateral hearing aids  . DJD (degenerative joint disease)   . History of tuberculosis     remote Hx  . Mouth burn   . BPH (benign prostatic hyperplasia)   . Internal hemorrhoids     severe  . Aortic sclerosis     Probable AS on physical exam, 2010  . AAA (abdominal aortic aneurysm)     3.1 cm AAA, reevaluated 08/2009 per ultrasound - stable   Family History  Problem Relation Age of Onset  . Coronary artery disease    . Other Father 74    [place]  . Heart attack Mother 43  . Heart disease Mother     ASHD  . COPD Brother   . Hypertension Brother   . Heart disease Brother     ASHD  . Heart disease Brother     ASHD  . Heart disease Sister     ASHD  . COPD Brother    History  Substance Use Topics  . Smoking status: Former  Smoker -- 0.50 packs/day for 15 years    Types: Cigarettes    Quit date: 09/13/1965  . Smokeless tobacco: Never Used  . Alcohol Use: No    Review of Systems  Constitutional: Negative for fever, activity change, appetite change, fatigue and unexpected weight change.  Respiratory: Negative for cough, chest tightness, shortness of breath and wheezing.   Cardiovascular: Negative for chest pain, palpitations and leg swelling.  Neurological: Negative for dizziness, weakness and headaches.  Psychiatric/Behavioral: Negative for dysphoric mood. The patient is not nervous/anxious.   All other systems reviewed and are negative.   Patient Care Team: Rowe Clack, MD as PCP - General (Internal  Medicine) Evans Lance, MD (Cardiology) Belva Crome, MD (Cardiology) Tanda Rockers, MD (Pulmonary Disease)     Objective:    Physical Exam  Constitutional: He is oriented to person, place, and time. He appears well-developed and well-nourished. No distress.  HENT:  Head: Normocephalic and atraumatic.  Right Ear: External ear normal.  Left Ear: External ear normal.  Nose: Nose normal.  Mouth/Throat: Oropharynx is clear and moist. No oropharyngeal exudate.  Wears hearing aids bilaterally  Eyes: Conjunctivae and EOM are normal. Pupils are equal, round, and reactive to light. No scleral icterus.  Neck: Normal range of motion. Neck supple. No JVD present. No thyromegaly present.  Cardiovascular: Normal rate, regular rhythm and intact distal pulses.  Exam reveals no friction rub.   Murmur (2/6 syst, unchanged) heard. No edema.  Pulmonary/Chest: Effort normal and breath sounds normal. No respiratory distress. He has no wheezes.  Abdominal: Soft. Bowel sounds are normal. He exhibits no distension and no mass. There is no tenderness. There is no guarding.  Genitourinary:  defer  Musculoskeletal: Normal range of motion. He exhibits no edema or tenderness.  Lymphadenopathy:    He has no cervical adenopathy.  Neurological: He is alert and oriented to person, place, and time. He has normal reflexes. No cranial nerve deficit. Coordination normal.  Skin: Skin is warm and dry. No rash noted. No erythema.  Psychiatric: He has a normal mood and affect. His behavior is normal. Judgment and thought content normal.    BP 108/54 mmHg  Pulse 60  Temp(Src) 97.5 F (36.4 C) (Oral)  Ht 5\' 4"  (1.626 m)  Wt 134 lb 12 oz (61.122 kg)  BMI 23.12 kg/m2  SpO2 96% Wt Readings from Last 3 Encounters:  04/10/15 134 lb 12 oz (61.122 kg)  03/13/15 135 lb 12.8 oz (61.598 kg)  03/11/15 139 lb (63.05 kg)    Lab Results  Component Value Date   WBC 6.4 11/26/2014   HGB 14.2 11/26/2014   HCT 41.3  11/26/2014   PLT 240.0 11/26/2014   GLUCOSE 108* 05/03/2014   CHOL 164 02/28/2014   TRIG 155.0* 02/28/2014   HDL 45.10 02/28/2014   LDLCALC 88 02/28/2014   ALT 11 04/30/2014   AST 17 04/30/2014   NA 134* 05/03/2014   K 3.8 05/03/2014   CL 104 05/03/2014   CREATININE 0.90 05/03/2014   BUN 9 05/03/2014   CO2 19 05/03/2014   TSH 3.44 02/28/2014   PSA 3.40 08/30/2013   INR 1.19 08/27/2011    Dg Chest 2 View  11/26/2014   CLINICAL DATA:  Cough and congestion. Obstructive chronic bronchitis with exacerbation.  EXAM: CHEST  2 VIEW  COMPARISON:  June 26, 2014.  FINDINGS: The heart size and mediastinal contours are within normal limits. Status post coronary artery bypass graft. Left-sided pacemaker is unchanged  in position. No pneumothorax or pleural effusion is noted. Stable calcified pleural plaque is seen on the right. Both lungs are clear. The visualized skeletal structures are unremarkable.  IMPRESSION: No active cardiopulmonary disease.   Electronically Signed   By: Marijo Conception, M.D.   On: 11/26/2014 14:28       Assessment & Plan:   CPX/ZWV - documentation of other elements done 6/17 by RN health coach - Today patient counseled on age appropriate routine health concerns for screening and prevention, each reviewed and up to date or declined. Immunizations reviewed and up to date or declined. Labs ordered and  reviewed. Risk factors for depression reviewed and negative. Hearing function stable with hearing aides and visual acuity are intact. ADLs screened and addressed as needed. Functional ability and level of safety reviewed and appropriate. Education, counseling and referrals performed based on assessed risks today. Patient provided with a copy of personalized plan for preventive services.  Random, mild hyperglycemia last labs. Patient believes he was fasting. Recheck with labs today and A1c  Problem List Items Addressed This Visit    Atrial flutter / Atrial Fibrillation  (Chronic)    Follows with EP/cardiology for same per last note February 2016, remains in sinus 99% of the time Continue anticoagulation as ongoing. No medication changes recommended      Relevant Medications   apixaban (ELIQUIS) 2.5 MG TABS tablet   Other Relevant Orders   Basic metabolic panel   CBC with Differential/Platelet   TSH   Hyperlipidemia with target LDL less than 70 (Chronic)    On statin Last lipids reviewed The current medical regimen is effective;  continue present plan and medications.       Relevant Medications   apixaban (ELIQUIS) 2.5 MG TABS tablet   Other Relevant Orders   Lipid panel    Other Visit Diagnoses    Routine general medical examination at a health care facility    -  Primary    Relevant Orders    Basic metabolic panel    CBC with Differential/Platelet    Hepatic function panel    Lipid panel    TSH    Urinalysis, Routine w reflex microscopic (not at [facility])    Hyperglycemia        Relevant Orders    Hemoglobin A1c        Gwendolyn Grant, MD

## 2015-04-11 ENCOUNTER — Other Ambulatory Visit (INDEPENDENT_AMBULATORY_CARE_PROVIDER_SITE_OTHER): Payer: Medicare Other

## 2015-04-11 DIAGNOSIS — R739 Hyperglycemia, unspecified: Secondary | ICD-10-CM | POA: Diagnosis not present

## 2015-04-11 DIAGNOSIS — E785 Hyperlipidemia, unspecified: Secondary | ICD-10-CM

## 2015-04-11 DIAGNOSIS — I4892 Unspecified atrial flutter: Secondary | ICD-10-CM | POA: Diagnosis not present

## 2015-04-11 DIAGNOSIS — Z0000 Encounter for general adult medical examination without abnormal findings: Secondary | ICD-10-CM | POA: Diagnosis not present

## 2015-04-11 LAB — HEPATIC FUNCTION PANEL
ALBUMIN: 4.2 g/dL (ref 3.5–5.2)
ALK PHOS: 62 U/L (ref 39–117)
ALT: 12 U/L (ref 0–53)
AST: 17 U/L (ref 0–37)
BILIRUBIN DIRECT: 0.2 mg/dL (ref 0.0–0.3)
BILIRUBIN TOTAL: 0.8 mg/dL (ref 0.2–1.2)
Total Protein: 7.1 g/dL (ref 6.0–8.3)

## 2015-04-11 LAB — CBC WITH DIFFERENTIAL/PLATELET
BASOS PCT: 0.4 % (ref 0.0–3.0)
Basophils Absolute: 0 10*3/uL (ref 0.0–0.1)
EOS PCT: 4.3 % (ref 0.0–5.0)
Eosinophils Absolute: 0.3 10*3/uL (ref 0.0–0.7)
HCT: 43.7 % (ref 39.0–52.0)
HEMOGLOBIN: 14.6 g/dL (ref 13.0–17.0)
LYMPHS ABS: 1.7 10*3/uL (ref 0.7–4.0)
Lymphocytes Relative: 26.7 % (ref 12.0–46.0)
MCHC: 33.5 g/dL (ref 30.0–36.0)
MCV: 93.8 fl (ref 78.0–100.0)
Monocytes Absolute: 0.6 10*3/uL (ref 0.1–1.0)
Monocytes Relative: 10.1 % (ref 3.0–12.0)
Neutro Abs: 3.7 10*3/uL (ref 1.4–7.7)
Neutrophils Relative %: 58.5 % (ref 43.0–77.0)
Platelets: 228 10*3/uL (ref 150.0–400.0)
RBC: 4.66 Mil/uL (ref 4.22–5.81)
RDW: 13.8 % (ref 11.5–15.5)
WBC: 6.3 10*3/uL (ref 4.0–10.5)

## 2015-04-11 LAB — BASIC METABOLIC PANEL
BUN: 14 mg/dL (ref 6–23)
CALCIUM: 9 mg/dL (ref 8.4–10.5)
CO2: 28 meq/L (ref 19–32)
Chloride: 105 mEq/L (ref 96–112)
Creatinine, Ser: 0.98 mg/dL (ref 0.40–1.50)
GFR: 77.83 mL/min (ref >60.00)
GLUCOSE: 104 mg/dL — AB (ref 70–99)
POTASSIUM: 4.4 meq/L (ref 3.5–5.1)
SODIUM: 139 meq/L (ref 135–145)

## 2015-04-11 LAB — LIPID PANEL
CHOLESTEROL: 131 mg/dL (ref 0–200)
HDL: 41.2 mg/dL (ref >39.00)
LDL Cholesterol: 53 mg/dL (ref 0–99)
NONHDL: 89.64
TRIGLYCERIDES: 182 mg/dL — AB (ref 0.0–149.0)
Total CHOL/HDL Ratio: 3
VLDL: 36.4 mg/dL (ref 0.0–40.0)

## 2015-04-11 LAB — URINALYSIS, ROUTINE W REFLEX MICROSCOPIC
BILIRUBIN URINE: NEGATIVE
Hgb urine dipstick: NEGATIVE
Ketones, ur: NEGATIVE
Leukocytes, UA: NEGATIVE
NITRITE: NEGATIVE
RBC / HPF: NONE SEEN (ref 0–?)
Specific Gravity, Urine: >=1.03 — AB (ref 1.000–1.030)
TOTAL PROTEIN, URINE-UPE24: NEGATIVE
UROBILINOGEN UA: 0.2 (ref 0.0–1.0)
Urine Glucose: NEGATIVE
pH: 6 (ref 5.0–8.0)

## 2015-04-11 LAB — TSH: TSH: 2.29 u[IU]/mL (ref 0.35–4.50)

## 2015-04-11 LAB — HEMOGLOBIN A1C: Hgb A1c MFr Bld: 5.5 % (ref 4.6–6.5)

## 2015-05-09 ENCOUNTER — Ambulatory Visit (INDEPENDENT_AMBULATORY_CARE_PROVIDER_SITE_OTHER): Payer: Medicare Other | Admitting: Internal Medicine

## 2015-05-09 ENCOUNTER — Encounter: Payer: Self-pay | Admitting: Internal Medicine

## 2015-05-09 VITALS — BP 128/60 | HR 61 | Temp 97.8°F | Resp 16 | Wt 134.0 lb

## 2015-05-09 DIAGNOSIS — J029 Acute pharyngitis, unspecified: Secondary | ICD-10-CM

## 2015-05-09 DIAGNOSIS — J309 Allergic rhinitis, unspecified: Secondary | ICD-10-CM

## 2015-05-09 MED ORDER — MONTELUKAST SODIUM 10 MG PO TABS: 10.0000 mg | ORAL_TABLET | Freq: Every day | ORAL | Status: DC

## 2015-05-09 NOTE — Progress Notes (Signed)
Pre visit review using our clinic review tool, if applicable. No additional management support is needed unless otherwise documented below in the visit note. 

## 2015-05-09 NOTE — Patient Instructions (Signed)
Plain Mucinex (NOT D) for thick secretions ;force NON dairy fluids .   Nasal cleansing in the shower as discussed with lather of mild shampoo.After 10 seconds wash off lather while  exhaling through nostrils. Make sure that all residual soap is removed to prevent irritation.  Flonase OR Nasacort AQ 1 spray in each nostril twice a day as needed. Use the "crossover" technique into opposite nostril spraying toward opposite ear @ 45 degree angle, not straight up into nostril.  Plain Allegra (NOT D )  160 daily , Loratidine 10 mg , OR Zyrtec 10 mg @ bedtime  as needed for itchy eyes & sneezing.  Please fill Rx for generic Singulair ( Monteluklast) 10 mg daily if  allergic symptoms are not controlled with the nasal hygiene program above.

## 2015-05-09 NOTE — Progress Notes (Signed)
   Subjective:    Patient ID: Shaun James, male    DOB: [birth year], 81 y.o.   MRN: [id number]  HPI  He has recurrent sore mouth and throat. He was previously treated with azithromycin with improvement; but symptoms have recurred. He's been using Chloraseptic for sore throat.  He has itchy, watery eyes, and sneezing associated with the symptoms.  He is on a PPI daily and denies any significant GI symptoms.  He also denies any other signs of an upper respiratory tract infection or extrinsic symptoms. He is on an angiotensin receptor blocker but denies any tingling of lips or tongue or other angioedema symptoms.    Review of Systems Frontal headache, facial pain , nasal purulence, dental pain,  otic pain or otic discharge denied. No fever , chills or sweats. Unexplained weight loss, abdominal pain, significant dyspepsia, dysphagia, melena, rectal bleeding, or persistently small caliber stools are denied.      Objective:   Physical Exam  General appearance:Adequately nourished; no acute distress or increased work of breathing is present. Pattern alopecia.  Lymphatic: No  lymphadenopathy about the head, neck, or axilla .  Eyes: No conjunctival inflammation or lid edema is present. There is no scleral icterus.  Ears:  External ear exam shows no significant lesions or deformities. He has bilateral hearing aids; but he is still profoundly hard of hearing.  Nose:  External nasal examination shows no deformity or inflammation. The right nasal passage is boggy and edematous. Nasal septum is deviated to the right   Oral exam: Dental hygiene is good; lips and gums are healthy appearing.There is no significant oropharyngeal erythema or exudate .  Neck:  No deformities, thyromegaly, masses, or tenderness noted.   Supple with full range of motion without pain.   Heart:  Normal rate and regular rhythm. S1 and S2 normal without gallop, click, rub or other extra sounds. He has a grade 1 raspy  systolic murmur.  Lungs:faint diffuse rales  present. Breath sounds are decreased.  Extremities:  No cyanosis, edema, or clubbing  noted    Skin: Warm & dry w/o tenting or jaundice. No significant lesions or rash.        Assessment & Plan:  #1 allergic rhinitis See orders & AVS

## 2015-05-12 ENCOUNTER — Telehealth: Payer: Self-pay | Admitting: Interventional Cardiology

## 2015-05-12 NOTE — Telephone Encounter (Signed)
Called patient's home. Patient's daughter is calling for patient about his constant sore throat, patches of sores in mouth and dry mouth. Patient thinks this could be due to his Cartia XT after reading about side effects. Patient wants to know if there is something else he can take. Will forward to Dr. Tamala Julian for advisement.

## 2015-05-12 NOTE — Telephone Encounter (Signed)
Pt c/o medication issue: 1. Name of Medication: Cartia Xt and Flomax  2. How are you currently taking this medication (dosage and times per day)? Cardia Xt 1 capal daily and Flomax once daily   3. Are you having a reaction (difficulty breathing--STAT)?  Red blisters in his mouth. Constant sore throat, dry mouth.  4. What is your medication issue? Pt daughter called states that he has seen other doctors and they said that its not a problem. He has looked up the side effects and found that these two medications could be the cause of the effects that he has currently.

## 2015-05-14 NOTE — Telephone Encounter (Signed)
Shaun James is a longstanding therapy . Doubt that it is responsible. Okay to stop the therapy for up to 1 week to see if improvement.

## 2015-05-15 NOTE — Telephone Encounter (Signed)
Called to give pt daughter Dr.Smith's response.[email or web address] 

## 2015-05-18 ENCOUNTER — Other Ambulatory Visit: Payer: Self-pay | Admitting: Interventional Cardiology

## 2015-07-08 ENCOUNTER — Ambulatory Visit (INDEPENDENT_AMBULATORY_CARE_PROVIDER_SITE_OTHER): Payer: Medicare Other | Admitting: Internal Medicine

## 2015-07-08 ENCOUNTER — Encounter: Payer: Self-pay | Admitting: Internal Medicine

## 2015-07-08 VITALS — BP 110/62 | HR 60 | Temp 98.1°F | Wt 135.0 lb

## 2015-07-08 DIAGNOSIS — L200 Besnier's prurigo: Secondary | ICD-10-CM

## 2015-07-08 DIAGNOSIS — L239 Allergic contact dermatitis, unspecified cause: Secondary | ICD-10-CM

## 2015-07-08 MED ORDER — RANITIDINE HCL 150 MG PO TABS: 150.0000 mg | ORAL_TABLET | Freq: Two times a day (BID) | ORAL | Status: DC

## 2015-07-08 MED ORDER — PREDNISONE 10 MG PO TABS: ORAL_TABLET | ORAL | Status: DC

## 2015-07-08 MED ORDER — VALSARTAN 320 MG PO TABS: 160.0000 mg | ORAL_TABLET | Freq: Every day | ORAL | Status: DC

## 2015-07-08 NOTE — Progress Notes (Signed)
   Subjective:    Patient ID: Shaun James, male    DOB: 1930-10-09, 81 y.o.   MRN: [id number]  HPI He describes an itchy rash mainly over the posterior thorax the last 10 days. There was no specific trigger or exacerbating factor for this. There've been no new medicines or chemical exposures.  Review of Systems  No associated itchy, watery eyes.  Swelling of the lips or tongue or intraoral lesions denied.  Shortness of breath, wheezing, or cough absent.  No vesicles, pustules or urticaria noted.  Fever ,chills , or sweats denied.   Diarrhea not present.  No dysuria, pyuria or hematuria.    Objective:   Physical Exam  Any rash is difficult to visualize over the posterior thorax. He does have some punctate excoriations mainly over the right upper extremity superiorly. He has a grade 1.5 systolic murmur. Faint rales are noted at the bases. Dermatographia can be elicited.  General appearance:Adequately nourished; no acute distress or increased work of breathing is present.    Lymphatic: No  lymphadenopathy about the head, neck, or axilla .  Eyes: No conjunctival inflammation or lid edema is present. There is no scleral icterus.  Ears:  External ear exam shows no significant lesions or deformities.  Otoscopic examination reveals clear canals, tympanic membranes are intact bilaterally without bulging, retraction, inflammation or discharge.  Nose:  External nasal examination shows no deformity or inflammation. Nasal mucosa are pink and moist without lesions or exudates No septal dislocation or deviation.No obstruction to airflow.   Oral exam: Dental hygiene is good; lips and gums are healthy appearing.There is no oropharyngeal erythema or exudate .  Neck:  No deformities, thyromegaly, masses, or tenderness noted.   Supple with full range of motion without pain.   Heart:  Normal rate and regular rhythm. S1 and S2 normal without gallop, click, rub or other extra sounds.    Extremities:  No cyanosis, edema, or clubbing  noted   Skin: Warm & dry w/o tenting or jaundice. No significant lesions or rash.      Assessment & Plan:  #1 allergic dermatitis; this could be food or medication related.  Plan: See orders and after visit summary

## 2015-07-08 NOTE — Patient Instructions (Addendum)
Avoid soaps and cosmetics which are not hypoallergenic. Restrict hyperallergenic foods at this time: Nuts, strawberries, seafood , chocolate, and tomatoes. Take the ranitidine 30 minutes before breakfast and evening meal . This blocks a histamine pathway as we discussed.  Zyrtec 10 mg at bedtime until itching and rash gone.   if you have any skin eruption; please document any dietary , medicinal, chemical or environmental exposures in the previous 8-12 hours.

## 2015-07-11 ENCOUNTER — Other Ambulatory Visit: Payer: Self-pay | Admitting: Internal Medicine

## 2015-07-11 ENCOUNTER — Telehealth: Payer: Self-pay | Admitting: Internal Medicine

## 2015-07-11 DIAGNOSIS — L308 Other specified dermatitis: Secondary | ICD-10-CM

## 2015-07-11 DIAGNOSIS — L299 Pruritus, unspecified: Secondary | ICD-10-CM

## 2015-07-11 NOTE — Telephone Encounter (Signed)
Stop prednisone & monitor rash.Restart it if rash still present after 48 hrs

## 2015-07-11 NOTE — Telephone Encounter (Signed)
Patient was in the other day and was prescribed prednisone.  He has redness on his face and is wondering if the medication would cause this? Should he stop taking this His daughter Coralyn Mark can be reached at [phone]

## 2015-07-11 NOTE — Telephone Encounter (Signed)
Dermatology referral recommended if no better with other oral meds & off prednisone

## 2015-07-11 NOTE — Telephone Encounter (Signed)
Pt called to check up about this, please call his daughter back

## 2015-07-11 NOTE — Telephone Encounter (Signed)
Called daughter she states the m sg was to let md know that he had stop the prednisone yesterday. The rash thatn on his face looks more like acne, but since he stop the prednisone its starting to clear up. The rash that is on his back still look the same when md saw it at the ov. Requesting advisement...Johny Chess

## 2015-07-14 NOTE — Telephone Encounter (Signed)
Called pt no answer LMOM RTC,,,,/lmb

## 2015-07-15 NOTE — Telephone Encounter (Signed)
Called [name] again still no answer LMOM if father rash has not cleared up let Dr. Linna Darner know & he will refer hi  To see dermatologist. Closing encounter...Shaun James

## 2015-08-12 ENCOUNTER — Ambulatory Visit (INDEPENDENT_AMBULATORY_CARE_PROVIDER_SITE_OTHER): Payer: Medicare Other | Admitting: Internal Medicine

## 2015-08-12 ENCOUNTER — Encounter: Payer: Self-pay | Admitting: Internal Medicine

## 2015-08-12 ENCOUNTER — Other Ambulatory Visit: Payer: Medicare Other

## 2015-08-12 VITALS — BP 120/82 | HR 55 | Temp 98.7°F | Resp 20 | Ht 65.0 in | Wt 138.5 lb

## 2015-08-12 DIAGNOSIS — K146 Glossodynia: Secondary | ICD-10-CM

## 2015-08-12 DIAGNOSIS — J3489 Other specified disorders of nose and nasal sinuses: Secondary | ICD-10-CM

## 2015-08-12 DIAGNOSIS — I25719 Atherosclerosis of autologous vein coronary artery bypass graft(s) with unspecified angina pectoris: Secondary | ICD-10-CM

## 2015-08-12 DIAGNOSIS — K219 Gastro-esophageal reflux disease without esophagitis: Secondary | ICD-10-CM

## 2015-08-12 NOTE — Assessment & Plan Note (Signed)
Fungal culture of tongue

## 2015-08-12 NOTE — Progress Notes (Signed)
Pre visit review using our clinic review tool, if applicable. No additional management support is needed unless otherwise documented below in the visit note. 

## 2015-08-12 NOTE — Patient Instructions (Addendum)
We will call you with the results of the tongue culture.   All other Health Maintenance issues reviewed.   All recommended immunizations and age-appropriate screenings are up-to-date.  No immunizations administered today.   Medications reviewed and updated.   No changes recommended at this time.  A referral was ordered for an allergist  Please schedule followup in July for your wellness exam.

## 2015-08-12 NOTE — Assessment & Plan Note (Signed)
Controlled Continue current medicaiton
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
## 2015-08-12 NOTE — Assessment & Plan Note (Signed)
Stable Following with cardiology Continue current medications

## 2015-08-12 NOTE — Assessment & Plan Note (Signed)
Likely related to allergies or medication side effect Some improvement with Singulair He would like to see an allergist for further evaluation-refer today

## 2015-08-20 LAB — FUNGUS CULTURE W SMEAR
ORGANISM ID, BACTERIA: NO GROWTH
SMEAR RESULT: NONE SEEN

## 2015-08-21 ENCOUNTER — Telehealth: Payer: Self-pay | Admitting: Internal Medicine

## 2015-08-21 DIAGNOSIS — K146 Glossodynia: Secondary | ICD-10-CM

## 2015-08-21 NOTE — Telephone Encounter (Signed)
Referral ordered

## 2015-08-21 NOTE — Telephone Encounter (Signed)
Patient is requesting to move forward with ENT referral

## 2015-08-22 ENCOUNTER — Encounter: Payer: Self-pay | Admitting: Internal Medicine

## 2015-08-22 NOTE — Telephone Encounter (Signed)
LVM informing pt

## 2015-09-09 ENCOUNTER — Ambulatory Visit: Payer: Medicare Other | Admitting: Internal Medicine
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
## 2015-10-05 ENCOUNTER — Other Ambulatory Visit: Payer: Self-pay | Admitting: Interventional Cardiology

## 2015-11-05 ENCOUNTER — Encounter: Payer: Self-pay | Admitting: Internal Medicine

## 2015-11-05 ENCOUNTER — Ambulatory Visit (INDEPENDENT_AMBULATORY_CARE_PROVIDER_SITE_OTHER): Payer: Medicare Other | Admitting: Internal Medicine

## 2015-11-05 VITALS — BP 96/50 | HR 51 | Ht 65.0 in | Wt 135.8 lb

## 2015-11-05 DIAGNOSIS — Z950 Presence of cardiac pacemaker: Secondary | ICD-10-CM | POA: Diagnosis not present

## 2015-11-05 DIAGNOSIS — I495 Sick sinus syndrome: Secondary | ICD-10-CM

## 2015-11-05 LAB — CUP PACEART INCLINIC DEVICE CHECK
Date Time Interrogation Session: 20170222150032
Implantable Lead Implant Date: 20121214
Implantable Lead Implant Date: 20121214
Implantable Lead Serial Number: 29098858
MDC IDC LEAD LOCATION: 753859
MDC IDC LEAD LOCATION: 753860
MDC IDC LEAD MODEL: 4135
MDC IDC LEAD MODEL: 4136
MDC IDC LEAD SERIAL: 29066260
MDC IDC PG SERIAL: 118262

## 2015-11-05 MED ORDER — DILTIAZEM HCL ER COATED BEADS 120 MG PO CP24: 120.0000 mg | ORAL_CAPSULE | Freq: Every day | ORAL | Status: DC

## 2015-11-05 NOTE — Patient Instructions (Signed)
Medication Instructions:  Your physician has recommended you make the following change in your medication:  DECREASE  DILTIAZEM 120 MG  EVERY DAY  Labwork: NONE Testing/Procedures: NONE Follow-Up: Your physician wants you to follow-up in: [place]  Zonia Kief will receive a reminder letter in the mail two months in advance. If you don't receive a letter, please call our office to schedule the follow-up appointment.  Any Other Special Instructions Will Be Listed Below (If Applicable).     If you need a refill on your cardiac medications before your next appointment, please call your pharmacy.
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
## 2015-11-10 ENCOUNTER — Encounter: Payer: Self-pay | Admitting: Internal Medicine

## 2015-11-14 ENCOUNTER — Other Ambulatory Visit: Payer: Self-pay | Admitting: Interventional Cardiology

## 2015-11-14 NOTE — Telephone Encounter (Signed)
Lipids followed by pcp. Should this be deferred to their office?

## 2015-11-17 NOTE — Telephone Encounter (Signed)
yes

## 2015-11-26 ENCOUNTER — Telehealth: Payer: Self-pay | Admitting: Internal Medicine

## 2015-11-26 NOTE — Telephone Encounter (Signed)
New Message:  She wants to know if his transmissions were received?

## 2015-11-26 NOTE — Telephone Encounter (Signed)
LMOVM for pt daughter to return call.  

## 2015-11-28 NOTE — Telephone Encounter (Signed)
Spoke w/ pt daughter and informed her that we did receive pt remote transmission on 11-25-15. She verbalized understanding and is aware that if there was any problems w/ the transmission someone would call and we will send a letter w/ next transmission date.

## 2015-12-15 ENCOUNTER — Other Ambulatory Visit: Payer: Self-pay | Admitting: Internal Medicine

## 2016-01-28 ENCOUNTER — Ambulatory Visit (INDEPENDENT_AMBULATORY_CARE_PROVIDER_SITE_OTHER)
Admission: RE | Admit: 2016-01-28 | Discharge: 2016-01-28 | Disposition: A | Discharge status: Home / Self Care | Payer: Medicare Other | Source: Ambulatory Visit | Attending: Internal Medicine | Admitting: Internal Medicine

## 2016-01-28 ENCOUNTER — Other Ambulatory Visit (INDEPENDENT_AMBULATORY_CARE_PROVIDER_SITE_OTHER): Payer: Medicare Other

## 2016-01-28 ENCOUNTER — Ambulatory Visit (INDEPENDENT_AMBULATORY_CARE_PROVIDER_SITE_OTHER): Payer: Medicare Other | Admitting: Internal Medicine

## 2016-01-28 ENCOUNTER — Encounter: Payer: Self-pay | Admitting: Internal Medicine

## 2016-01-28 ENCOUNTER — Telehealth: Payer: Self-pay | Admitting: Geriatric Medicine

## 2016-01-28 VITALS — BP 122/82 | HR 66 | Temp 98.3°F | Resp 16 | Ht 64.0 in | Wt 130.0 lb

## 2016-01-28 DIAGNOSIS — M25521 Pain in right elbow: Secondary | ICD-10-CM | POA: Diagnosis not present

## 2016-01-28 DIAGNOSIS — I4892 Unspecified atrial flutter: Secondary | ICD-10-CM | POA: Diagnosis not present

## 2016-01-28 LAB — URIC ACID: URIC ACID, SERUM: 4.9 mg/dL (ref 4.0–7.8)

## 2016-01-28 LAB — BASIC METABOLIC PANEL
BUN: 14 mg/dL (ref 6–23)
CALCIUM: 9.3 mg/dL (ref 8.4–10.5)
CHLORIDE: 103 meq/L (ref 96–112)
CO2: 29 meq/L (ref 19–32)
Creatinine, Ser: 0.98 mg/dL (ref 0.40–1.50)
GFR: 77.68 mL/min (ref >60.00)
GLUCOSE: 118 mg/dL — AB (ref 70–99)
POTASSIUM: 3.7 meq/L (ref 3.5–5.1)
SODIUM: 138 meq/L (ref 135–145)

## 2016-01-28 LAB — SEDIMENTATION RATE: Sed Rate: 9 mm/hr (ref 0–22)

## 2016-01-28 MED ORDER — PREDNISONE 20 MG PO TABS: 20.0000 mg | ORAL_TABLET | Freq: Every day | ORAL | Status: DC

## 2016-01-28 NOTE — Assessment & Plan Note (Addendum)
Labs X ray Predn x3 d ACE

## 2016-01-28 NOTE — Assessment & Plan Note (Signed)
Unable to use NSAIDs

## 2016-01-28 NOTE — Telephone Encounter (Signed)
Please verify instructions on prednisone. It reads take with breakfast and 3 times daily with food.

## 2016-01-28 NOTE — Telephone Encounter (Signed)
Sorry - it was written x 3 days which meant: 20 mg daily once a day for 3 days Thx

## 2016-01-28 NOTE — Progress Notes (Signed)
Subjective:  Patient ID: Shaun James, male    DOB: 09/14/1930  Age: 82 y.o. MRN: [id number]  CC: No chief complaint on file.   HPI Shaun James presents for pain in the R elbow x 3 weeks. It was bad a couple nights ago - better now... No injury. He has A fib, CAD  Outpatient Prescriptions Prior to Visit  Medication Sig Dispense Refill  . acetaminophen (TYLENOL) 500 MG tablet Take 1 tablet (500 mg total) by mouth daily as needed for headache. 14 tablet 0  . apixaban (ELIQUIS) 2.5 MG TABS tablet Take 1 tablet (2.5 mg total) by mouth 2 (two) times daily. 180 tablet 3  . diltiazem (CARDIZEM CD) 120 MG 24 hr capsule Take 1 capsule (120 mg total) by mouth daily. 90 capsule 3  . nitroGLYCERIN (NITROSTAT) 0.4 MG SL tablet Place 1 tablet (0.4 mg total) under the tongue every 5 (five) minutes as needed for chest pain. 25 tablet 3  . ranolazine (RANEXA) 500 MG 12 hr tablet Take 1 tablet (500 mg total) by mouth 2 (two) times daily. 180 tablet 3  . simvastatin (ZOCOR) 20 MG tablet Take 1 tablet (20 mg total) by mouth every evening. 14 tablet 0  . tamsulosin (FLOMAX) 0.4 MG CAPS capsule Take 0.4 mg by mouth daily.    . valsartan (DIOVAN) 320 MG tablet TAKE ONE-HALF (1/2) TABLET DAILY 45 tablet 2   No facility-administered medications prior to visit.    ROS Review of Systems  Constitutional: Negative for fever.  Respiratory: Negative for cough and shortness of breath.   Cardiovascular: Negative for chest pain and palpitations.  Gastrointestinal: Negative for abdominal pain and anal bleeding.  Musculoskeletal: Positive for arthralgias. Negative for myalgias and gait problem.  Skin: Negative for rash.    Objective:  BP 122/82 mmHg  Pulse 66  Temp(Src) 98.3 F (36.8 C) (Oral)  Resp 16  Ht 5\' 4"  (1.626 m)  Wt 130 lb (58.968 kg)  BMI 22.30 kg/m2  SpO2 97%  BP Readings from Last 3 Encounters:  01/28/16 122/82  11/05/15 96/50  08/12/15 120/82    Wt Readings from Last 3 Encounters:    01/28/16 130 lb (58.968 kg)  11/05/15 135 lb 12.8 oz (61.598 kg)  08/12/15 138 lb 8 oz (62.823 kg)    Physical Exam  Constitutional: No distress.  HENT:  Mouth/Throat: No oropharyngeal exudate.  Neck: No tracheal deviation present.  Cardiovascular: Normal rate, regular rhythm and normal heart sounds.   Abdominal: There is no tenderness.  Musculoskeletal: He exhibits tenderness. He exhibits no edema.  Neurological: Coordination normal.  Skin: No rash noted. No erythema.  R elbow is tender w/flexing and palpation; no effusion Hard hearing  Lab Results  Component Value Date   WBC 6.3 04/11/2015   HGB 14.6 04/11/2015   HCT 43.7 04/11/2015   PLT 228.0 04/11/2015   GLUCOSE 104* 04/11/2015   CHOL 131 04/11/2015   TRIG 182.0* 04/11/2015   HDL 41.20 04/11/2015   LDLCALC 53 04/11/2015   ALT 12 04/11/2015   AST 17 04/11/2015   NA 139 04/11/2015   K 4.4 04/11/2015   CL 105 04/11/2015   CREATININE 0.98 04/11/2015   BUN 14 04/11/2015   CO2 28 04/11/2015   TSH 2.29 04/11/2015   PSA 3.40 08/30/2013   INR 1.19 08/27/2011   HGBA1C 5.5 04/11/2015    Dg Chest 2 View  11/26/2014  CLINICAL DATA:  Cough and congestion. Obstructive chronic bronchitis with exacerbation. EXAM:  CHEST  2 VIEW COMPARISON:  June 26, 2014. FINDINGS: The heart size and mediastinal contours are within normal limits. Status post coronary artery bypass graft. Left-sided pacemaker is unchanged in position. No pneumothorax or pleural effusion is noted. Stable calcified pleural plaque is seen on the right. Both lungs are clear. The visualized skeletal structures are unremarkable. IMPRESSION: No active cardiopulmonary disease. Electronically Signed   By: Marijo Conception, M.D.   On: 11/26/2014 14:28    Assessment & Plan:   There are no diagnoses linked to this encounter. I am having Mr. Traver maintain his acetaminophen, simvastatin, tamsulosin, nitroGLYCERIN, ranolazine, apixaban, diltiazem, and valsartan.  No orders  of the defined types were placed in this encounter.     Follow-up: No Follow-up on file.  Walker Kehr, MD

## 2016-01-28 NOTE — Progress Notes (Signed)
Pre visit review using our clinic review tool, if applicable. No additional management support is needed unless otherwise documented below in the visit note. 

## 2016-01-29 NOTE — Telephone Encounter (Signed)
Spoke with pharmacy to verify infections.

## 2016-02-04 ENCOUNTER — Ambulatory Visit (INDEPENDENT_AMBULATORY_CARE_PROVIDER_SITE_OTHER): Payer: Medicare Other | Admitting: *Deleted

## 2016-02-04 DIAGNOSIS — I495 Sick sinus syndrome: Secondary | ICD-10-CM

## 2016-02-04 NOTE — Progress Notes (Signed)
Remote pacemaker transmission.   

## 2016-02-16 ENCOUNTER — Ambulatory Visit (INDEPENDENT_AMBULATORY_CARE_PROVIDER_SITE_OTHER): Payer: Medicare Other | Admitting: Internal Medicine

## 2016-02-16 ENCOUNTER — Encounter: Payer: Self-pay | Admitting: Internal Medicine

## 2016-02-16 VITALS — BP 124/74 | HR 58 | Temp 97.5°F | Resp 16 | Wt 135.0 lb

## 2016-02-16 DIAGNOSIS — K219 Gastro-esophageal reflux disease without esophagitis: Secondary | ICD-10-CM | POA: Diagnosis not present

## 2016-02-16 DIAGNOSIS — I25719 Atherosclerosis of autologous vein coronary artery bypass graft(s) with unspecified angina pectoris: Secondary | ICD-10-CM

## 2016-02-16 DIAGNOSIS — M25521 Pain in right elbow: Secondary | ICD-10-CM

## 2016-02-16 DIAGNOSIS — E785 Hyperlipidemia, unspecified: Secondary | ICD-10-CM | POA: Diagnosis not present

## 2016-02-16 NOTE — Patient Instructions (Addendum)

## 2016-02-16 NOTE — Progress Notes (Signed)
Subjective:    Shaun James ID: Shaun James, male    DOB: 1931-02-18, 82 y.o.   MRN: [id number]  HPI Shaun James is here for follow up of right elbow pain.   Shaun James had the pain for three week prior to being seen.  Shaun James was prescribed prednisone for 3 days and the pharmacy gave Shaun James a hard time and Shaun James did not end up getting in.  Shaun James just took tylenol and it is better.  Shaun James esr and kidney function was normal.  Shaun James elbow no longer hurts.  CAD, Hypertension: Shaun James is taking Shaun James medication daily. Shaun James is compliant with a low sodium diet.  Shaun James denies chest pain, palpitations, edema, shortness of breath and regular headaches. Shaun James is exercising regularly - treadmill three times a week, sometimes walks, some weights/sit ups.    GERD:  Shaun James has occasional gerd and takes prilosec only as needed.   Hyperlipidemia: Shaun James is taking Shaun James medication daily. Shaun James is compliant with a low fat/cholesterol diet. Shaun James is exercising regularly.    Medications and allergies reviewed with Shaun James and updated if appropriate.  Shaun James Active Problem List   Diagnosis Date Noted  . Right elbow pain 01/28/2016  . Rhinorrhea 08/12/2015  . Burning tongue 08/12/2015  . GERD (gastroesophageal reflux disease) 08/12/2015  . BPH without urinary obstruction 04/10/2015  . 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 Mucopurulent chronic bronchitis ([place]) 07/17/2014  . Insomnia 05/22/2014  . Chronic headache   . Osteoarthritis   . Pacemaker 10/18/2011  . Atrial flutter / Atrial Fibrillation 09/13/2011  . CAD (coronary artery disease), autologous vein bypass graft 09/13/2011  . Sick sinus syndrome ([place]) 09/13/2011  . Hyperlipidemia with target LDL less than 70 04/16/2008  . Acute myocardial infarction ([place]) 04/16/2008  . Pulmonary fibrosis ([place]) 04/16/2008    Current Outpatient Prescriptions on File Prior to Visit  Medication Sig Dispense Refill  . acetaminophen (TYLENOL) 500 MG tablet Take 1 tablet (500 mg total) by mouth daily as needed for headache.  14 tablet 0  . apixaban (ELIQUIS) 2.5 MG TABS tablet Take 1 tablet (2.5 mg total) by mouth 2 (two) times daily. 180 tablet 3  . diltiazem (CARDIZEM CD) 120 MG 24 hr capsule Take 1 capsule (120 mg total) by mouth daily. 90 capsule 3  . nitroGLYCERIN (NITROSTAT) 0.4 MG SL tablet Place 1 tablet (0.4 mg total) under the tongue every 5 (five) minutes as needed for chest pain. 25 tablet 3  . predniSONE (DELTASONE) 20 MG tablet Take 1 tablet (20 mg total) by mouth daily with breakfast. Take x 3 days w/food 3 tablet 1  . ranolazine (RANEXA) 500 MG 12 hr tablet Take 1 tablet (500 mg total) by mouth 2 (two) times daily. 180 tablet 3  . simvastatin (ZOCOR) 20 MG tablet Take 1 tablet (20 mg total) by mouth every evening. 14 tablet 0  . tamsulosin (FLOMAX) 0.4 MG CAPS capsule Take 0.4 mg by mouth daily.    . valsartan (DIOVAN) 320 MG tablet TAKE ONE-HALF (1/2) TABLET DAILY 45 tablet 2   No current facility-administered medications on file prior to visit.    Past Medical History  Diagnosis Date  . CAD in native artery     a) s/p CABG '98; LIMA-LAD, SVG-D1, SVG-OM1, SVG-PDA); b) CATH -2009: midLAD & RCA 100%, OM2 100% wtih severe native Cx; Grafts patent with ~30-50% SVG-D1 & ~30-40% SVG-OM, EF 45%; c) Lexiscan Cardiolite 02/2014: EF 61%, No Ischemia; subtle fixed anteroseptal defect.  . Myocardial infarction (  [place])   . Atrial flutter ([place])   . SSS (sick sinus syndrome) ([facility])     syncope, s/p ppm 12/12  . Arthritis   . Allergic rhinitis   . Osteoarthritis 06/14/2012  . Prostatitis dx 12/2102    E coli Ucx  . Inflamed seborrheic keratosis   . Basal cell carcinoma   . Personal history of colonic polyps   . Essential hypertension, benign   . Coronary atherosclerosis of native coronary artery   . Esophageal reflux   . Peptic ulcer, unspecified site, unspecified as acute or chronic, without mention of hemorrhage, perforation, or obstruction   . Diverticulosis of colon (without mention of hemorrhage)   .  Mixed hyperlipidemia   . Near syncope     uncertain cause. R/O arrhythmia, R/O med effect  . Cardiac pacemaker in situ     [employer]  . Systolic murmur     Worrisome for AS. AS could cause exertional fatigue.  . Atrial fibrillation ([facility])     chronic anticoag  . Benign positional vertigo   . GERD (gastroesophageal reflux disease)   . Intermittent confusion   . Presbycusis of both ears     Bilateral hearing aids  . DJD (degenerative joint disease)   . History of tuberculosis     remote Hx  . Mouth burn   . BPH (benign prostatic hyperplasia)   . Internal hemorrhoids     severe  . Aortic sclerosis ([place])     Probable AS on physical exam, 2010  . AAA (abdominal aortic aneurysm) ([facility])     3.1 cm AAA, reevaluated 08/2009 per ultrasound - stable    Past Surgical History  Procedure Laterality Date  . Hemorrhoid surgery  07/31/2010  . Cataracts      bilateral  . Cardiac catheterization  2002  . Left rotator cuff surgery  2000  . Coronary artery bypass graft  1997    (LIMA to LAD, SVG to diagonal-50% closed on catheterization in 1999, SVG to OM1, SVG to PDA) Repeat cath 2009 with patent grafts  . Excision of tongue lesion    . Punch biopsy of skin  08/2009    5 mm punch biopsy on upper mid back melanotic appearing lesion  . Insert / replace / remove pacemaker  08/27/2011    PPM implant  . Permanent pacemaker insertion N/A 08/27/2011    Procedure: PERMANENT PACEMAKER INSERTION;  Surgeon: Evans Lance, MD;  Location: [facility] CATH LAB;  Service: Cardiovascular;  Laterality: N/A;    Social History   Social History  . Marital Status: Married    Spouse Name: N/A  . Number of Children: 4  . Years of Education: N/A   Occupational History  . retired     retired Art gallery manager   Social History Main Topics  . Smoking status: Former Smoker -- 0.50 packs/day for 15 years    Types: Cigarettes    Quit date: 09/13/1965  . Smokeless tobacco: Never Used  . Alcohol Use: No    . Drug Use: No  . Sexual Activity: Not Currently   Other Topics Concern  . Not on file   Social History Narrative   Moved from [place] in [place] alone; lives in 2 story home but stays downstairs   3 children / 1 other;     Family History  Problem Relation Age of Onset  . Coronary artery disease    . Other Father 53    [place]  .  Heart attack Mother 63  . Heart disease Mother     ASHD  . COPD Brother   . Hypertension Brother   . Heart disease Brother     ASHD  . Heart disease Brother     ASHD  . Heart disease Sister     ASHD  . COPD Brother     Review of Systems  Constitutional: Negative for fever, chills and fatigue.  Respiratory: Negative for cough, shortness of breath and wheezing.   Cardiovascular: Negative for chest pain, palpitations and leg swelling.  Neurological: Positive for dizziness (occasional). Negative for light-headedness and headaches.       Objective:   Filed Vitals:   02/16/16 0836  BP: 124/74  Pulse: 58  Temp: 97.5 F (36.4 C)  Resp: 16   Filed Weights   02/16/16 0836  Weight: 135 lb (61.236 kg)   Body mass index is 23.16 kg/(m^2).   Physical Exam  Constitutional: Shaun James appears well-developed and well-nourished. No distress.  HENT:  Head: Normocephalic and atraumatic.  Eyes: Conjunctivae are normal.  Neck: Neck supple. No tracheal deviation present. No thyromegaly present.  Cardiovascular: Normal rate and regular rhythm.   Murmur (2/6 systolic murmur) heard. Pulmonary/Chest: Effort normal and breath sounds normal. No respiratory distress. Shaun James has no wheezes. Shaun James has no rales.  Musculoskeletal: Shaun James exhibits no edema.  Right elbow without swelling, erythema and tenderness  Lymphadenopathy:    Shaun James has no cervical adenopathy.  Skin: Shaun James is not diaphoretic.  Psychiatric: Shaun James has a normal mood and affect. Shaun James behavior is normal.          Assessment & Plan:   See Problem List for Assessment and Plan of chronic medical  problems.  F/u in 6 months for a CPE

## 2016-02-16 NOTE — Progress Notes (Signed)
Pre visit review using our clinic review tool, if applicable. No additional management support is needed unless otherwise documented below in the visit note. 

## 2016-02-17 ENCOUNTER — Other Ambulatory Visit (INDEPENDENT_AMBULATORY_CARE_PROVIDER_SITE_OTHER): Payer: Medicare Other

## 2016-02-17 DIAGNOSIS — I25719 Atherosclerosis of autologous vein coronary artery bypass graft(s) with unspecified angina pectoris: Secondary | ICD-10-CM | POA: Diagnosis not present

## 2016-02-17 DIAGNOSIS — E785 Hyperlipidemia, unspecified: Secondary | ICD-10-CM

## 2016-02-17 DIAGNOSIS — K219 Gastro-esophageal reflux disease without esophagitis: Secondary | ICD-10-CM | POA: Diagnosis not present

## 2016-02-17 LAB — CBC WITH DIFFERENTIAL/PLATELET
BASOS ABS: 0 10*3/uL (ref 0.0–0.1)
BASOS PCT: 0.3 % (ref 0.0–3.0)
EOS ABS: 0.2 10*3/uL (ref 0.0–0.7)
Eosinophils Relative: 3.3 % (ref 0.0–5.0)
HEMATOCRIT: 42.2 % (ref 39.0–52.0)
HEMOGLOBIN: 14.4 g/dL (ref 13.0–17.0)
LYMPHS PCT: 28.6 % (ref 12.0–46.0)
Lymphs Abs: 1.9 10*3/uL (ref 0.7–4.0)
MCHC: 34.2 g/dL (ref 30.0–36.0)
MCV: 91.9 fl (ref 78.0–100.0)
Monocytes Absolute: 0.7 10*3/uL (ref 0.1–1.0)
Monocytes Relative: 10 % (ref 3.0–12.0)
Neutro Abs: 3.9 10*3/uL (ref 1.4–7.7)
Neutrophils Relative %: 57.8 % (ref 43.0–77.0)
Platelets: 230 10*3/uL (ref 150.0–400.0)
RBC: 4.59 Mil/uL (ref 4.22–5.81)
RDW: 13.1 % (ref 11.5–15.5)
WBC: 6.7 10*3/uL (ref 4.0–10.5)

## 2016-02-17 LAB — COMPREHENSIVE METABOLIC PANEL
ALBUMIN: 4.4 g/dL (ref 3.5–5.2)
ALT: 13 U/L (ref 0–53)
AST: 16 U/L (ref 0–37)
Alkaline Phosphatase: 59 U/L (ref 39–117)
BILIRUBIN TOTAL: 0.9 mg/dL (ref 0.2–1.2)
BUN: 13 mg/dL (ref 6–23)
CALCIUM: 9.4 mg/dL (ref 8.4–10.5)
CO2: 29 meq/L (ref 19–32)
CREATININE: 1.09 mg/dL (ref 0.40–1.50)
Chloride: 103 mEq/L (ref 96–112)
GFR: 68.69 mL/min (ref >60.00)
Glucose, Bld: 106 mg/dL — ABNORMAL HIGH (ref 70–99)
Potassium: 4 mEq/L (ref 3.5–5.1)
SODIUM: 137 meq/L (ref 135–145)
Total Protein: 7.1 g/dL (ref 6.0–8.3)

## 2016-02-17 LAB — TSH: TSH: 4.17 u[IU]/mL (ref 0.35–4.50)

## 2016-02-17 LAB — LIPID PANEL
CHOL/HDL RATIO: 4
Cholesterol: 144 mg/dL (ref 0–200)
HDL: 38.7 mg/dL — ABNORMAL LOW (ref >39.00)
LDL Cholesterol: 67 mg/dL (ref 0–99)
NonHDL: 105.26
TRIGLYCERIDES: 189 mg/dL — AB (ref 0.0–149.0)
VLDL: 37.8 mg/dL (ref 0.0–40.0)

## 2016-02-22 LAB — CUP PACEART REMOTE DEVICE CHECK
Battery Remaining Longevity: 72 mo
Battery Remaining Percentage: 100 %
Brady Statistic RA Percent Paced: 8 %
Date Time Interrogation Session: 20170524111900
Implantable Lead Implant Date: 20121214
Implantable Lead Location: 753859
Implantable Lead Model: 4135
Implantable Lead Serial Number: 29066260
Lead Channel Pacing Threshold Pulse Width: 0.4 ms
Lead Channel Pacing Threshold Pulse Width: 0.4 ms
Lead Channel Setting Pacing Amplitude: 1 V
Lead Channel Setting Pacing Amplitude: 2 V
Lead Channel Setting Pacing Pulse Width: 0.4 ms
Lead Channel Setting Sensing Sensitivity: 2.5 mV
MDC IDC LEAD IMPLANT DT: 20121214
MDC IDC LEAD LOCATION: 753860
MDC IDC LEAD MODEL: 4136
MDC IDC LEAD SERIAL: 29098858
MDC IDC MSMT LEADCHNL RA IMPEDANCE VALUE: 559 Ohm
MDC IDC MSMT LEADCHNL RA PACING THRESHOLD AMPLITUDE: 0.9 V
MDC IDC MSMT LEADCHNL RV IMPEDANCE VALUE: 814 Ohm
MDC IDC MSMT LEADCHNL RV PACING THRESHOLD AMPLITUDE: 0.5 V
MDC IDC STAT BRADY RV PERCENT PACED: 11 %
Pulse Gen Serial Number: 118262

## 2016-02-23 ENCOUNTER — Encounter: Payer: Self-pay | Admitting: Emergency Medicine

## 2016-03-02 ENCOUNTER — Encounter: Payer: Self-pay | Admitting: Cardiology

## 2016-03-06 ENCOUNTER — Other Ambulatory Visit: Payer: Self-pay | Admitting: Internal Medicine

## 2016-03-10 ENCOUNTER — Emergency Department (HOSPITAL_COMMUNITY): Payer: Medicare Other

## 2016-03-10 ENCOUNTER — Telehealth: Payer: Self-pay | Admitting: Internal Medicine

## 2016-03-10 ENCOUNTER — Observation Stay (HOSPITAL_COMMUNITY): Payer: Medicare Other

## 2016-03-10 ENCOUNTER — Observation Stay (HOSPITAL_COMMUNITY)
Admission: EM | Admit: 2016-03-10 | Discharge: 2016-03-11 | Disposition: A | Discharge status: Home / Self Care | Payer: Medicare Other | Attending: Internal Medicine | Admitting: Internal Medicine

## 2016-03-10 ENCOUNTER — Encounter (HOSPITAL_COMMUNITY): Payer: Self-pay | Admitting: Emergency Medicine

## 2016-03-10 DIAGNOSIS — Z86010 Personal history of colonic polyps: Secondary | ICD-10-CM | POA: Insufficient documentation

## 2016-03-10 DIAGNOSIS — I2510 Atherosclerotic heart disease of native coronary artery without angina pectoris: Secondary | ICD-10-CM | POA: Diagnosis not present

## 2016-03-10 DIAGNOSIS — I6529 Occlusion and stenosis of unspecified carotid artery: Secondary | ICD-10-CM

## 2016-03-10 DIAGNOSIS — I25719 Atherosclerosis of autologous vein coronary artery bypass graft(s) with unspecified angina pectoris: Secondary | ICD-10-CM

## 2016-03-10 DIAGNOSIS — I10 Essential (primary) hypertension: Secondary | ICD-10-CM | POA: Insufficient documentation

## 2016-03-10 DIAGNOSIS — Z950 Presence of cardiac pacemaker: Secondary | ICD-10-CM | POA: Diagnosis present

## 2016-03-10 DIAGNOSIS — I4892 Unspecified atrial flutter: Secondary | ICD-10-CM | POA: Diagnosis present

## 2016-03-10 DIAGNOSIS — R011 Cardiac murmur, unspecified: Secondary | ICD-10-CM | POA: Diagnosis not present

## 2016-03-10 DIAGNOSIS — Z888 Allergy status to other drugs, medicaments and biological substances status: Secondary | ICD-10-CM | POA: Insufficient documentation

## 2016-03-10 DIAGNOSIS — I671 Cerebral aneurysm, nonruptured: Secondary | ICD-10-CM

## 2016-03-10 DIAGNOSIS — M4802 Spinal stenosis, cervical region: Secondary | ICD-10-CM | POA: Diagnosis not present

## 2016-03-10 DIAGNOSIS — Z87891 Personal history of nicotine dependence: Secondary | ICD-10-CM | POA: Insufficient documentation

## 2016-03-10 DIAGNOSIS — K219 Gastro-esophageal reflux disease without esophagitis: Secondary | ICD-10-CM | POA: Insufficient documentation

## 2016-03-10 DIAGNOSIS — M1990 Unspecified osteoarthritis, unspecified site: Secondary | ICD-10-CM | POA: Diagnosis not present

## 2016-03-10 DIAGNOSIS — I4891 Unspecified atrial fibrillation: Secondary | ICD-10-CM | POA: Diagnosis not present

## 2016-03-10 DIAGNOSIS — R270 Ataxia, unspecified: Secondary | ICD-10-CM | POA: Diagnosis not present

## 2016-03-10 DIAGNOSIS — I252 Old myocardial infarction: Secondary | ICD-10-CM | POA: Diagnosis not present

## 2016-03-10 DIAGNOSIS — Z7901 Long term (current) use of anticoagulants: Secondary | ICD-10-CM | POA: Insufficient documentation

## 2016-03-10 DIAGNOSIS — Z85828 Personal history of other malignant neoplasm of skin: Secondary | ICD-10-CM | POA: Diagnosis not present

## 2016-03-10 DIAGNOSIS — Z8711 Personal history of peptic ulcer disease: Secondary | ICD-10-CM | POA: Diagnosis not present

## 2016-03-10 DIAGNOSIS — I495 Sick sinus syndrome: Secondary | ICD-10-CM | POA: Insufficient documentation

## 2016-03-10 DIAGNOSIS — Z8719 Personal history of other diseases of the digestive system: Secondary | ICD-10-CM | POA: Insufficient documentation

## 2016-03-10 DIAGNOSIS — E785 Hyperlipidemia, unspecified: Secondary | ICD-10-CM | POA: Diagnosis not present

## 2016-03-10 DIAGNOSIS — E782 Mixed hyperlipidemia: Secondary | ICD-10-CM | POA: Diagnosis not present

## 2016-03-10 DIAGNOSIS — I714 Abdominal aortic aneurysm, without rupture: Secondary | ICD-10-CM | POA: Insufficient documentation

## 2016-03-10 DIAGNOSIS — J8410 Pulmonary fibrosis, unspecified: Secondary | ICD-10-CM | POA: Diagnosis not present

## 2016-03-10 DIAGNOSIS — R911 Solitary pulmonary nodule: Secondary | ICD-10-CM

## 2016-03-10 DIAGNOSIS — Z951 Presence of aortocoronary bypass graft: Secondary | ICD-10-CM | POA: Insufficient documentation

## 2016-03-10 DIAGNOSIS — N400 Enlarged prostate without lower urinary tract symptoms: Secondary | ICD-10-CM | POA: Diagnosis not present

## 2016-03-10 DIAGNOSIS — Z8611 Personal history of tuberculosis: Secondary | ICD-10-CM | POA: Insufficient documentation

## 2016-03-10 DIAGNOSIS — H8110 Benign paroxysmal vertigo, unspecified ear: Principal | ICD-10-CM

## 2016-03-10 DIAGNOSIS — Z885 Allergy status to narcotic agent status: Secondary | ICD-10-CM | POA: Diagnosis not present

## 2016-03-10 DIAGNOSIS — R42 Dizziness and giddiness: Secondary | ICD-10-CM | POA: Diagnosis not present

## 2016-03-10 DIAGNOSIS — I6523 Occlusion and stenosis of bilateral carotid arteries: Secondary | ICD-10-CM | POA: Diagnosis not present

## 2016-03-10 DIAGNOSIS — Z880 Allergy status to penicillin: Secondary | ICD-10-CM | POA: Insufficient documentation

## 2016-03-10 DIAGNOSIS — I25810 Atherosclerosis of coronary artery bypass graft(s) without angina pectoris: Secondary | ICD-10-CM | POA: Diagnosis present

## 2016-03-10 LAB — URINALYSIS, ROUTINE W REFLEX MICROSCOPIC
Bilirubin Urine: NEGATIVE
GLUCOSE, UA: NEGATIVE mg/dL
Hgb urine dipstick: NEGATIVE
KETONES UR: NEGATIVE mg/dL
LEUKOCYTES UA: NEGATIVE
NITRITE: NEGATIVE
PROTEIN: NEGATIVE mg/dL
Specific Gravity, Urine: 1.012 (ref 1.005–1.030)
pH: 7.5 (ref 5.0–8.0)

## 2016-03-10 LAB — I-STAT TROPONIN, ED: Troponin i, poc: 0 ng/mL (ref 0.00–0.08)

## 2016-03-10 LAB — CBC
HCT: 42.3 % (ref 39.0–52.0)
HEMOGLOBIN: 14.7 g/dL (ref 13.0–17.0)
MCH: 31.6 pg (ref 26.0–34.0)
MCHC: 34.8 g/dL (ref 30.0–36.0)
MCV: 91 fL (ref 78.0–100.0)
Platelets: 190 10*3/uL (ref 150–400)
RBC: 4.65 MIL/uL (ref 4.22–5.81)
RDW: 12.8 % (ref 11.5–15.5)
WBC: 5.6 10*3/uL (ref 4.0–10.5)

## 2016-03-10 LAB — RAPID URINE DRUG SCREEN, HOSP PERFORMED
Amphetamines: NOT DETECTED
BARBITURATES: NOT DETECTED
Benzodiazepines: NOT DETECTED
Cocaine: NOT DETECTED
Opiates: NOT DETECTED
TETRAHYDROCANNABINOL: NOT DETECTED

## 2016-03-10 LAB — COMPREHENSIVE METABOLIC PANEL
ALBUMIN: 4.2 g/dL (ref 3.5–5.0)
ALK PHOS: 56 U/L (ref 38–126)
ALT: 15 U/L — ABNORMAL LOW (ref 17–63)
AST: 21 U/L (ref 15–41)
Anion gap: 7 (ref 5–15)
BILIRUBIN TOTAL: 0.8 mg/dL (ref 0.3–1.2)
BUN: 11 mg/dL (ref 6–20)
CALCIUM: 9.5 mg/dL (ref 8.9–10.3)
CO2: 26 mmol/L (ref 22–32)
CREATININE: 0.97 mg/dL (ref 0.61–1.24)
Chloride: 105 mmol/L (ref 101–111)
GFR calc Af Amer: >60 mL/min (ref >60)
GFR calc non Af Amer: >60 mL/min (ref >60)
GLUCOSE: 96 mg/dL (ref 65–99)
Potassium: 4.3 mmol/L (ref 3.5–5.1)
Sodium: 138 mmol/L (ref 135–145)
TOTAL PROTEIN: 7 g/dL (ref 6.5–8.1)

## 2016-03-10 LAB — DIFFERENTIAL
BASOS ABS: 0.1 10*3/uL (ref 0.0–0.1)
Basophils Relative: 1 %
EOS PCT: 4 %
Eosinophils Absolute: 0.2 10*3/uL (ref 0.0–0.7)
LYMPHS ABS: 1.8 10*3/uL (ref 0.7–4.0)
LYMPHS PCT: 32 %
Monocytes Absolute: 0.5 10*3/uL (ref 0.1–1.0)
Monocytes Relative: 10 %
NEUTROS PCT: 53 %
Neutro Abs: 3 10*3/uL (ref 1.7–7.7)

## 2016-03-10 LAB — APTT: aPTT: 34 seconds (ref 24–37)

## 2016-03-10 LAB — PROTIME-INR
INR: 1.18 (ref 0.00–1.49)
Prothrombin Time: 15.2 seconds (ref 11.6–15.2)

## 2016-03-10 MED ORDER — ACETAMINOPHEN 325 MG PO TABS: 650.0000 mg | ORAL_TABLET | ORAL | Status: DC | PRN

## 2016-03-10 MED ORDER — PREDNISOLONE ACETATE 1 % OP SUSP
1.0000 [drp] | Freq: Four times a day (QID) | OPHTHALMIC | Status: DC
Start: 2016-03-10 — End: 2016-03-11
  Administered 2016-03-10 – 2016-03-11 (×3): 1 [drp] via OPHTHALMIC
  Filled 2016-03-10: qty 1

## 2016-03-10 MED ORDER — RANOLAZINE ER 500 MG PO TB12
500.0000 mg | ORAL_TABLET | Freq: Two times a day (BID) | ORAL | Status: DC
  Administered 2016-03-10 – 2016-03-11 (×2): 500 mg via ORAL
  Filled 2016-03-10 (×2): qty 1

## 2016-03-10 MED ORDER — MECLIZINE HCL 12.5 MG PO TABS: 25.0000 mg | ORAL_TABLET | Freq: Two times a day (BID) | ORAL | Status: DC | PRN

## 2016-03-10 MED ORDER — SENNOSIDES-DOCUSATE SODIUM 8.6-50 MG PO TABS: 1.0000 | ORAL_TABLET | Freq: Every evening | ORAL | Status: DC | PRN

## 2016-03-10 MED ORDER — LORAZEPAM 2 MG/ML IJ SOLN
0.5000 mg | Freq: Once | INTRAMUSCULAR | Status: AC
  Administered 2016-03-10: 0.5 mg via INTRAVENOUS
  Filled 2016-03-10: qty 1

## 2016-03-10 MED ORDER — SIMVASTATIN 20 MG PO TABS
20.0000 mg | ORAL_TABLET | Freq: Every evening | ORAL | Status: DC
  Administered 2016-03-10: 20 mg via ORAL
  Filled 2016-03-10: qty 1

## 2016-03-10 MED ORDER — MECLIZINE HCL 25 MG PO TABS
25.0000 mg | ORAL_TABLET | Freq: Once | ORAL | Status: AC
  Administered 2016-03-10: 25 mg via ORAL
  Filled 2016-03-10: qty 1

## 2016-03-10 MED ORDER — IRBESARTAN 75 MG PO TABS
75.0000 mg | ORAL_TABLET | Freq: Every day | ORAL | Status: DC
Start: 2016-03-11 — End: 2016-03-11
  Administered 2016-03-11: 75 mg via ORAL
  Filled 2016-03-10: qty 1

## 2016-03-10 MED ORDER — MECLIZINE HCL 12.5 MG PO TABS
25.0000 mg | ORAL_TABLET | Freq: Three times a day (TID) | ORAL | Status: DC
  Administered 2016-03-10 – 2016-03-11 (×2): 25 mg via ORAL
  Filled 2016-03-10 (×2): qty 2

## 2016-03-10 MED ORDER — DILTIAZEM HCL ER COATED BEADS 120 MG PO CP24
120.0000 mg | ORAL_CAPSULE | Freq: Every day | ORAL | Status: DC
  Administered 2016-03-11: 120 mg via ORAL
  Filled 2016-03-10: qty 1

## 2016-03-10 MED ORDER — IOPAMIDOL (ISOVUE-370) INJECTION 76%
INTRAVENOUS | Status: AC
  Administered 2016-03-10: 50 mL
  Filled 2016-03-10: qty 50

## 2016-03-10 MED ORDER — ACETAMINOPHEN 650 MG RE SUPP: 650.0000 mg | RECTAL | Status: DC | PRN

## 2016-03-10 MED ORDER — TAMSULOSIN HCL 0.4 MG PO CAPS
0.4000 mg | ORAL_CAPSULE | Freq: Every day | ORAL | Status: DC
  Administered 2016-03-11: 0.4 mg via ORAL
  Filled 2016-03-10: qty 1

## 2016-03-10 MED ORDER — APIXABAN 2.5 MG PO TABS
2.5000 mg | ORAL_TABLET | Freq: Two times a day (BID) | ORAL | Status: DC
  Administered 2016-03-10 – 2016-03-11 (×2): 2.5 mg via ORAL
  Filled 2016-03-10 (×2): qty 1

## 2016-03-10 MED ORDER — SODIUM CHLORIDE 0.9 % IV SOLN
INTRAVENOUS | Status: DC
  Administered 2016-03-11: 09:00:00 via INTRAVENOUS

## 2016-03-10 NOTE — Progress Notes (Signed)
Patient arrived to 67C21. Patient is alert, oriented x4, NIHHS 0, complains of dizziness. Skin intact, tele set up, VSS. RN explained call bell, telephone, fall prevention protocols.
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
## 2016-03-10 NOTE — ED Notes (Signed)
Per [facility], pt was sitting at work and got suddenly dizzy. Laid down and dizziness went away, every time he sits up the dizziness comes back. Started at 10am. Around 1030 pt took a nitro, gave him headache, did not help the dizziness. Pt is not orthostatic per ems. VSS. 12 lead unremarkable.

## 2016-03-10 NOTE — ED Notes (Signed)
[employer] pacemaker

## 2016-03-10 NOTE — ED Notes (Signed)
Paged [place]

## 2016-03-10 NOTE — Telephone Encounter (Signed)
Patient Name: DAEJUAN DRENNON  DOB: 1930-11-09    Initial Comment caller states husband is having dizziness   Nurse Assessment  Nurse: Leilani Merl, RN, [place] Date/Time (Eastern Time): 03/10/2016 11:54:36 AM  Confirm and document reason for call. If symptomatic, describe symptoms. You must click the next button to save text entered. ---Caller states that her husband is at work right now and suddenly got dizzy, it has been about 15 min now. His wife is on her way to pick him up right now.  Has the patient traveled out of the country within the last 30 days? ---Not Applicable  Does the patient have any new or worsening symptoms? ---Yes  Will a triage be completed? ---Yes  Related visit to physician within the last 2 weeks? ---No  Does the PT have any chronic conditions? (i.e. diabetes, asthma, etc.) ---Yes  List chronic conditions. ---See MR  Is this a behavioral health or substance abuse call? ---No     Guidelines    Guideline Title Affirmed Question Affirmed Notes  Dizziness - Lightheadedness SEVERE dizziness (e.g., unable to stand, requires support to walk, feels like passing out now)    Final Disposition User   Go to ED Now (or PCP triage) Leilani Merl, RN, [facility] - ED   Disagree/Comply: Comply

## 2016-03-10 NOTE — ED Notes (Signed)
Pt transported to CT ?

## 2016-03-10 NOTE — Consult Note (Addendum)
Admission H&P    Chief Complaint: New onset vertigo.  HPI: Shaun James is an 82 y.o. male with a history of atrial fibrillation on anticoagulation, hypertension, hyperlipidemia, sick sinus syndrome with pacemaker implantation and coronary artery disease presenting with symptoms of vertigo. He is asymptomatic sitting or lying still. Vertigo is movement induced, particularly with standing. He's also had mild nausea. He has a history of vertigo but indicates that his symptoms currently are different and previous bouts. He's taking an over-the-counter medication for memory boosting which he started 2 weeks ago. It's unclear if this is a contributing factor to his presenting symptoms. CT scan of his head showed no acute intracranial abnormality. 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 MRI was not performed, as patient has cardiac pacemaker. He has not experienced diplopia nor during her speech. No focal weakness nor numbness has occurred.  Past Medical History  Diagnosis Date  . CAD in native artery     a) s/p CABG '98; LIMA-LAD, SVG-D1, SVG-OM1, SVG-PDA); b) CATH -2009: midLAD & RCA 100%, OM2 100% wtih severe native Cx; Grafts patent with ~30-50% SVG-D1 & ~30-40% SVG-OM, EF 45%; c) Lexiscan Cardiolite 02/2014: EF 61%, No Ischemia; subtle fixed anteroseptal defect.  . Myocardial infarction ([place])   . Atrial flutter ([place])   . SSS (sick sinus syndrome) (HCC)     syncope, s/p ppm 12/12  . Arthritis   . Allergic rhinitis   . Osteoarthritis 06/14/2012  . Prostatitis dx 12/2102    E coli Ucx  . Inflamed seborrheic keratosis   . Basal cell carcinoma   . Personal history of colonic polyps   . Essential hypertension, benign   . Coronary atherosclerosis of native coronary artery   . Esophageal reflux   . Peptic ulcer, unspecified site,  unspecified as acute or chronic, without mention of hemorrhage, perforation, or obstruction   . Diverticulosis of colon (without mention of hemorrhage)   . Mixed hyperlipidemia   . Near syncope     uncertain cause. R/O arrhythmia, R/O med effect  . Cardiac pacemaker in situ     [employer]  . Systolic murmur     Worrisome for AS. AS could cause exertional fatigue.  . Atrial fibrillation (HCC)     chronic anticoag  . Benign positional vertigo   . GERD (gastroesophageal reflux disease)   . Intermittent confusion   . Presbycusis of both ears     Bilateral hearing aids  . DJD (degenerative joint disease)   . History of tuberculosis     remote Hx  . Mouth burn   . BPH (benign prostatic hyperplasia)   . Internal hemorrhoids     severe  . Aortic sclerosis ([place])     Probable AS on physical exam, 2010  . AAA (abdominal aortic aneurysm) (HCC)     3.1 cm AAA, reevaluated 08/2009 per ultrasound - stable    Past Surgical History  Procedure Laterality Date  . Hemorrhoid surgery  07/31/2010  . Cataracts      bilateral  . Cardiac catheterization  2002  . Left rotator cuff surgery  2000  . Coronary artery bypass graft  1997    (LIMA to LAD, SVG to diagonal-50% closed on catheterization in 1999, SVG to OM1, SVG to PDA) Repeat cath 2009 with patent grafts  . Excision of tongue lesion    .  Punch biopsy of skin  08/2009    5 mm punch biopsy on upper mid back melanotic appearing lesion  . Insert / replace / remove pacemaker  08/27/2011    PPM implant  . Permanent pacemaker insertion N/A 08/27/2011    Procedure: PERMANENT PACEMAKER INSERTION;  Surgeon: Evans Lance, MD;  Location: [facility] CATH LAB;  Service: Cardiovascular;  Laterality: N/A;    Family History  Problem Relation Age of Onset  . Coronary artery disease    . Other Father 30    Eunice  . Heart attack Mother 85  . Heart disease Mother     ASHD  . COPD Brother   . Hypertension Brother   . Heart disease Brother      ASHD  . Heart disease Brother     ASHD  . Heart disease Sister     ASHD  . COPD Brother    Social History:  reports that he quit smoking about 50 years ago. His smoking use included Cigarettes. He has a 7.5 pack-year smoking history. He has never used smokeless tobacco. He reports that he does not drink alcohol or use illicit drugs.  Allergies:  Allergies  Allergen Reactions  . Codeine Rash  . Penicillins Rash  . Zyrtec [Cetirizine Hcl] Rash         Medications Prior to Admission  Medication Sig Dispense Refill  . acetaminophen (TYLENOL) 500 MG tablet Take 1 tablet (500 mg total) by mouth daily as needed for headache. 14 tablet 0  . apixaban (ELIQUIS) 2.5 MG TABS tablet Take 1 tablet (2.5 mg total) by mouth 2 (two) times daily. 180 tablet 3  . diltiazem (CARDIZEM CD) 120 MG 24 hr capsule Take 1 capsule (120 mg total) by mouth daily. 90 capsule 3  . nitroGLYCERIN (NITROSTAT) 0.4 MG SL tablet PLACE 1 TABLET UNDER THE TONGUE EVERY 5 MINUTES AS NEEDED FOR CHEST PAIN 25 tablet 5  . OVER THE COUNTER MEDICATION Take 1 tablet by mouth daily. Supplement for "Brain Boost" (ingredients include [place])    . prednisoLONE acetate (PRED FORTE) 1 % ophthalmic suspension Place 1 drop into both eyes 4 (four) times daily.  0  . ranolazine (RANEXA) 500 MG 12 hr tablet Take 1 tablet (500 mg total) by mouth 2 (two) times daily. 180 tablet 3  . simvastatin (ZOCOR) 20 MG tablet Take 1 tablet (20 mg total) by mouth every evening. 14 tablet 0  . tamsulosin (FLOMAX) 0.4 MG CAPS capsule Take 0.4 mg by mouth daily.    . valsartan (DIOVAN) 320 MG tablet TAKE ONE-HALF (1/2) TABLET DAILY 45 tablet 2    ROS: History obtained from chart review  General ROS: negative for - chills, fatigue, fever, night sweats, weight gain or weight loss Psychological ROS: negative for - behavioral disorder, hallucinations, memory difficulties, mood swings or suicidal ideation Ophthalmic ROS: negative for - blurry vision,  double vision, eye pain or loss of vision ENT ROS: negative for - epistaxis, nasal discharge, oral lesions, sore throat, tinnitus or vertigo Allergy and Immunology ROS: negative for - hives or itchy/watery eyes Hematological and Lymphatic ROS: negative for - bleeding problems, bruising or swollen lymph nodes Endocrine ROS: negative for - galactorrhea, hair pattern changes, polydipsia/polyuria or temperature intolerance Respiratory ROS: negative for - cough, hemoptysis, shortness of breath or wheezing Cardiovascular ROS: negative for - chest pain, dyspnea on exertion, edema or irregular heartbeat Gastrointestinal ROS: negative for - abdominal pain, diarrhea, hematemesis, nausea/vomiting or stool incontinence Genito-Urinary  ROS: negative for - dysuria, hematuria, incontinence or urinary frequency/urgency Musculoskeletal ROS: negative for - joint swelling or muscular weakness Neurological ROS: as noted in HPI Dermatological ROS: negative for rash and skin lesion changes  Physical Examination: Blood pressure 136/77, pulse 65, temperature 97.7 F (36.5 C), temperature source Oral, resp. rate 16, height 5' 2"  (1.575 m), weight 63.368 kg (139 lb 11.2 oz), SpO2 97 %.  HEENT-  Normocephalic, no lesions, without obvious abnormality.  Normal external eye and conjunctiva.  Normal TM's bilaterally.  Normal auditory canals and external ears. Normal external nose, mucus membranes and septum.  Normal pharynx. Neck supple with no masses, nodes, nodules or enlargement. Cardiovascular - regular rate and rhythm, S1, S2 normal, no murmur, click, rub or gallop Lungs - chest clear, no wheezing, rales, normal symmetric air entry Abdomen - soft, non-tender; bowel sounds normal; no masses,  no organomegaly Extremities - no joint deformities, effusion, or inflammation and no edema  Neurologic Examination: Mental Status: Alert, oriented, thought content appropriate.  Speech fluent without evidence of aphasia. Able to  follow commands without difficulty. Cranial Nerves: II-Visual fields were normal. III/IV/VI-Pupils were equal and reacted normally to light. Extraocular movements were full and conjugate.    V/VII-no facial numbness and no facial weakness. VIII-normal. X-normal speech and symmetrical palatal movement. XI: trapezius strength/neck flexion strength normal bilaterally XII-midline tongue extension with normal strength. Motor: 5/5 bilaterally with normal tone and bulk Sensory: Normal throughout. Deep Tendon Reflexes: Absent throughout. Plantars: Flexor bilaterally Cerebellar: Normal finger-to-nose testing. Carotid auscultation: Normal  Results for orders placed or performed during the hospital encounter of 03/10/16 (from the past 48 hour(s))  Protime-INR     Status: None   Collection Time: 03/10/16  1:48 PM  Result Value Ref Range   Prothrombin Time 15.2 11.6 - 15.2 seconds   INR 1.18 0.00 - 1.49  APTT     Status: None   Collection Time: 03/10/16  1:48 PM  Result Value Ref Range   aPTT 34 24 - 37 seconds  CBC     Status: None   Collection Time: 03/10/16  1:48 PM  Result Value Ref Range   WBC 5.6 4.0 - 10.5 K/uL   RBC 4.65 4.22 - 5.81 MIL/uL   Hemoglobin 14.7 13.0 - 17.0 g/dL   HCT 42.3 39.0 - 52.0 %   MCV 91.0 78.0 - 100.0 fL   MCH 31.6 26.0 - 34.0 pg   MCHC 34.8 30.0 - 36.0 g/dL   RDW 12.8 11.5 - 15.5 %   Platelets 190 150 - 400 K/uL  Differential     Status: None   Collection Time: 03/10/16  1:48 PM  Result Value Ref Range   Neutrophils Relative % 53 %   Neutro Abs 3.0 1.7 - 7.7 K/uL   Lymphocytes Relative 32 %   Lymphs Abs 1.8 0.7 - 4.0 K/uL   Monocytes Relative 10 %   Monocytes Absolute 0.5 0.1 - 1.0 K/uL   Eosinophils Relative 4 %   Eosinophils Absolute 0.2 0.0 - 0.7 K/uL   Basophils Relative 1 %   Basophils Absolute 0.1 0.0 - 0.1 K/uL  Comprehensive metabolic panel     Status: Abnormal   Collection Time: 03/10/16  1:48 PM  Result Value Ref Range   Sodium 138 135  - 145 mmol/L   Potassium 4.3 3.5 - 5.1 mmol/L   Chloride 105 101 - 111 mmol/L   CO2 26 22 - 32 mmol/L   Glucose, Bld 96 65 -  99 mg/dL   BUN 11 6 - 20 mg/dL   Creatinine, Ser 0.97 0.61 - 1.24 mg/dL   Calcium 9.5 8.9 - 10.3 mg/dL   Total Protein 7.0 6.5 - 8.1 g/dL   Albumin 4.2 3.5 - 5.0 g/dL   AST 21 15 - 41 U/L   ALT 15 (L) 17 - 63 U/L   Alkaline Phosphatase 56 38 - 126 U/L   Total Bilirubin 0.8 0.3 - 1.2 mg/dL   GFR calc non Af Amer >60 >60 mL/min   GFR calc Af Amer >60 >60 mL/min    Comment: (NOTE) The eGFR has been calculated using the CKD EPI equation. This calculation has not been validated in all clinical situations. eGFR's persistently <60 mL/min signify possible Chronic Kidney Disease.    Anion gap 7 5 - 15  Urine rapid drug screen (hosp performed)not at [facility]     Status: None   Collection Time: 03/10/16  1:58 PM  Result Value Ref Range   Opiates NONE DETECTED NONE DETECTED   Cocaine NONE DETECTED NONE DETECTED   Benzodiazepines NONE DETECTED NONE DETECTED   Amphetamines NONE DETECTED NONE DETECTED   Tetrahydrocannabinol NONE DETECTED NONE DETECTED   Barbiturates NONE DETECTED NONE DETECTED    Comment:        DRUG SCREEN FOR MEDICAL PURPOSES ONLY.  IF CONFIRMATION IS NEEDED FOR ANY PURPOSE, NOTIFY LAB WITHIN 5 DAYS.        LOWEST DETECTABLE LIMITS FOR URINE DRUG SCREEN Drug Class       Cutoff (ng/mL) Amphetamine      1000 Barbiturate      200 Benzodiazepine   209 Tricyclics       470 Opiates          300 Cocaine          300 THC              50   Urinalysis, Routine w reflex microscopic (not at [facility])     Status: None   Collection Time: 03/10/16  1:58 PM  Result Value Ref Range   Color, Urine YELLOW YELLOW   APPearance CLEAR CLEAR   Specific Gravity, Urine 1.012 1.005 - 1.030   pH 7.5 5.0 - 8.0   Glucose, UA NEGATIVE NEGATIVE mg/dL   Hgb urine dipstick NEGATIVE NEGATIVE   Bilirubin Urine NEGATIVE NEGATIVE   Ketones, ur NEGATIVE NEGATIVE mg/dL    Protein, ur NEGATIVE NEGATIVE mg/dL   Nitrite NEGATIVE NEGATIVE   Leukocytes, UA NEGATIVE NEGATIVE    Comment: MICROSCOPIC NOT DONE ON URINES WITH NEGATIVE PROTEIN, BLOOD, LEUKOCYTES, NITRITE, OR GLUCOSE <1000 mg/dL.  I-stat troponin, ED (not at [facility])     Status: None   Collection Time: 03/10/16  2:02 PM  Result Value Ref Range   Troponin i, poc 0.00 0.00 - 0.08 ng/mL   Comment 3            Comment: Due to the release kinetics of cTnI, a negative result within the first hours of the onset of symptoms does not rule out myocardial infarction with certainty. If myocardial infarction is still suspected, repeat the test at appropriate intervals.    Ct Angio Head W/cm &/or Wo Cm  03/10/2016  CLINICAL DATA:  New onset dizziness beginning at 10 a.m. today. Patient has a history of benign positional vertigo. Today's symptoms are atypical. EXAM: CT ANGIOGRAPHY HEAD AND NECK TECHNIQUE: Multidetector CT imaging of the head and neck was performed using the standard protocol during bolus  administration of intravenous contrast. Multiplanar CT image reconstructions and MIPs were obtained to evaluate the vascular anatomy. Carotid stenosis measurements (when applicable) are obtained utilizing NASCET criteria, using the distal internal carotid diameter as the denominator. CONTRAST:  50 mL Isovue 370 COMPARISON:  CT head without contrast from the same day. FINDINGS: CTA NECK Aortic arch: A 3 vessel arch configuration is present. Atherosclerotic calcifications are present at the origins of the great vessels without a significant stenosis. Right carotid system: The right common carotid artery is within normal limits. Atherosclerotic calcifications are present at the right carotid bifurcation without a significant stenosis. Cervical right ICA is within normal limits. Left carotid system: The left common carotid artery is tortuous proximally without a significant stenosis. Atherosclerotic changes are present at the  left carotid bifurcation without a significant stenosis relative to the more distal vessel. There is some tortuosity of the cervical left ICA. Vertebral arteries:Vertebral arteries originate from the subclavian arteries. There is a moderate stenosis at the origin of the non dominant left vertebral artery. There is no stenosis on the right. The vertebral arteries demonstrate moderate tortuosity without focal stenosis. Hypodensity across the left vertebral artery at the C2 level is artifactual. Skeleton: Multilevel facet degenerative changes are most significant on the right at C2-3 and on the left at C3-4 and C4-5. Endplate changes and spurring are greatest on the left at C3-4 with severe left foraminal stenosis at that level. No focal lytic or blastic lesions are present. The calvarium is intact. Other neck: No focal mucosal or submucosal lesions are present. Vocal cords are midline and symmetric. The salivary glands are within normal limits. The thyroid is unremarkable. No significant adenopathy is present. CTA HEAD Anterior circulation: Dense atherosclerotic calcifications are present within the cavernous internal carotid arteries bilaterally. There is a moderate to high-grade stenosis within the ophthalmic segment of the left internal carotid artery. 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 There is very little flow within the left A1 segment which may be occluded. The A2 segment is azygos. It fills from the right. The right A1 segment is within normal limits. Both M1 segments are unremarkable. MCA bifurcations are intact. ACA and MCA branch vessels are within normal limits. Posterior circulation: The right vertebral artery is the dominant vessel. PICA origins are visualized and normal bilaterally. The basilar artery is within normal limits. Both posterior cerebral arteries originate from the basilar tip. Prominent posterior communicating arteries are noted as well. The PCA branch vessels  are intact. Venous sinuses: The dural sinuses are patent. The straight sinus and deep cerebral veins are intact. The right transverse sinus is dominant. Cortical veins are unremarkable. Anatomic variants: Prominent posterior communicating arteries are present bilaterally. Delayed phase: Not performed. IMPRESSION: 1. Moderate to high-grade stenosis of the left internal carotid artery at the ophthalmic segment. 2. 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 3. Hypoplastic or occluded left A1 segment. 4. Atherosclerotic calcifications at the carotid bifurcations bilaterally without significant stenosis. 5. Moderate stenosis at the origin of the non dominant left vertebral artery. 6. Atherosclerotic changes at the origins the great vessels without stenosis. 7. Multilevel spondylosis of the cervical spine with severe left foraminal stenosis at C3-4. Electronically Signed   By: San Morelle M.D.   On: 03/10/2016 18:41   Dg Chest 2 View  03/10/2016  CLINICAL DATA:  Dizziness and nausea beginning this morning. EXAM: CHEST  2 VIEW COMPARISON:  11/26/2014 FINDINGS: The lungs are clear wiithout focal pneumonia, edema, pneumothorax  or pleural effusion. There is a small nodule in the right apex which was not present on the previous study or an exam from 06/26/2014. Calcified pleural plaques noted bilaterally. The cardiopericardial silhouette is within normal limits for size. Patient is status post CABG. Left delete permanent pacemaker remains in place. The visualized bony structures of the thorax are intact. IMPRESSION: New tiny nodule in the right upper lobe (superimposed on anterior first rib). CT chest without contrast recommended to further evaluate. Otherwise unremarkable. Electronically Signed   By: Misty Stanley M.D.   On: 03/10/2016 18:17   Ct Angio Neck W/cm &/or Wo/cm  03/10/2016  CLINICAL DATA:  New onset dizziness beginning at 10 a.m. today. Patient has a history of benign positional vertigo.  Today's symptoms are atypical. EXAM: CT ANGIOGRAPHY HEAD AND NECK TECHNIQUE: Multidetector CT imaging of the head and neck was performed using the standard protocol during bolus administration of intravenous contrast. Multiplanar CT image reconstructions and MIPs were obtained to evaluate the vascular anatomy. Carotid stenosis measurements (when applicable) are obtained utilizing NASCET criteria, using the distal internal carotid diameter as the denominator. CONTRAST:  50 mL Isovue 370 COMPARISON:  CT head without contrast from the same day. FINDINGS: CTA NECK Aortic arch: A 3 vessel arch configuration is present. Atherosclerotic calcifications are present at the origins of the great vessels without a significant stenosis. Right carotid system: The right common carotid artery is within normal limits. Atherosclerotic calcifications are present at the right carotid bifurcation without a significant stenosis. Cervical right ICA is within normal limits. Left carotid system: The left common carotid artery is tortuous proximally without a significant stenosis. Atherosclerotic changes are present at the left carotid bifurcation without a significant stenosis relative to the more distal vessel. There is some tortuosity of the cervical left ICA. Vertebral arteries:Vertebral arteries originate from the subclavian arteries. There is a moderate stenosis at the origin of the non dominant left vertebral artery. There is no stenosis on the right. The vertebral arteries demonstrate moderate tortuosity without focal stenosis. Hypodensity across the left vertebral artery at the C2 level is artifactual. Skeleton: Multilevel facet degenerative changes are most significant on the right at C2-3 and on the left at C3-4 and C4-5. Endplate changes and spurring are greatest on the left at C3-4 with severe left foraminal stenosis at that level. No focal lytic or blastic lesions are present. The calvarium is intact. Other neck: No focal  mucosal or submucosal lesions are present. Vocal cords are midline and symmetric. The salivary glands are within normal limits. The thyroid is unremarkable. No significant adenopathy is present. CTA HEAD Anterior circulation: Dense atherosclerotic calcifications are present within the cavernous internal carotid arteries bilaterally. There is a moderate to high-grade stenosis within the ophthalmic segment of the left internal carotid artery. 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 There is very little flow within the left A1 segment which may be occluded. The A2 segment is azygos. It fills from the right. The right A1 segment is within normal limits. Both M1 segments are unremarkable. MCA bifurcations are intact. ACA and MCA branch vessels are within normal limits. Posterior circulation: The right vertebral artery is the dominant vessel. PICA origins are visualized and normal bilaterally. The basilar artery is within normal limits. Both posterior cerebral arteries originate from the basilar tip. Prominent posterior communicating arteries are noted as well. The PCA branch vessels are intact. Venous sinuses: The dural sinuses are patent. The straight sinus and deep cerebral veins  are intact. The right transverse sinus is dominant. Cortical veins are unremarkable. Anatomic variants: Prominent posterior communicating arteries are present bilaterally. Delayed phase: Not performed. IMPRESSION: 1. Moderate to high-grade stenosis of the left internal carotid artery at the ophthalmic segment. 2. 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 3. Hypoplastic or occluded left A1 segment. 4. Atherosclerotic calcifications at the carotid bifurcations bilaterally without significant stenosis. 5. Moderate stenosis at the origin of the non dominant left vertebral artery. 6. Atherosclerotic changes at the origins the great vessels without stenosis. 7. Multilevel spondylosis of the cervical spine with severe  left foraminal stenosis at C3-4. Electronically Signed   By: San Morelle M.D.   On: 03/10/2016 18:41   Ct Head Code Stroke W/o Cm  03/10/2016  CLINICAL DATA:  Vertigo EXAM: CT HEAD WITHOUT CONTRAST TECHNIQUE: Contiguous axial images were obtained from the base of the skull through the vertex without intravenous contrast. COMPARISON:  CT head 11/24/2012 FINDINGS: Mild atrophy. Chronic microvascular ischemic change in the white matter. Negative for acute infarct.  Negative for hemorrhage or mass. Negative calvarium. IMPRESSION: Atrophy and chronic microvascular ischemia.  No acute abnormality. Electronically Signed   By: Franchot Gallo M.D.   On: 03/10/2016 14:30    Assessment/Plan 82 year old man presenting with probable benign paroxysmal positional vertigo. Stroke is unlikely in the absence of signs or symptoms of focal acute CNS deficit.   Recommendations: 1. Meclizine 25 mg 3 times a day 2. Physical therapy consult for vestibular training 3. No further neurodiagnostic studies are indicated at this point  We will continue to follow this patient with you on an as-needed basis.  C.R. Nicole Kindred, [place] [facility] [phone]  03/10/2016, 8:17 PM

## 2016-03-10 NOTE — ED Notes (Addendum)
Patient transported to X-ray. Pt A&Ox4, respirations equal and unlabored, skin warm and dry, pt in no apparent distress. Family at beside, denies pain.
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
## 2016-03-10 NOTE — ED Notes (Signed)
Pt returned from XR and CT in no apparent distress. Pt A&Ox4, respirations equal and unlabored, skin warm and dry

## 2016-03-11 ENCOUNTER — Encounter (HOSPITAL_COMMUNITY): Payer: Self-pay | Admitting: General Practice

## 2016-03-11 DIAGNOSIS — H8110 Benign paroxysmal vertigo, unspecified ear: Secondary | ICD-10-CM | POA: Diagnosis not present

## 2016-03-11 DIAGNOSIS — I6529 Occlusion and stenosis of unspecified carotid artery: Secondary | ICD-10-CM

## 2016-03-11 DIAGNOSIS — I6522 Occlusion and stenosis of left carotid artery: Secondary | ICD-10-CM

## 2016-03-11 DIAGNOSIS — I671 Cerebral aneurysm, nonruptured: Secondary | ICD-10-CM

## 2016-03-11 DIAGNOSIS — R42 Dizziness and giddiness: Secondary | ICD-10-CM | POA: Diagnosis not present

## 2016-03-11 DIAGNOSIS — I25719 Atherosclerosis of autologous vein coronary artery bypass graft(s) with unspecified angina pectoris: Secondary | ICD-10-CM | POA: Diagnosis not present

## 2016-03-11 LAB — LIPID PANEL
CHOL/HDL RATIO: 3.4 ratio
CHOLESTEROL: 117 mg/dL (ref 0–200)
HDL: 34 mg/dL — ABNORMAL LOW (ref >40)
LDL Cholesterol: 64 mg/dL (ref 0–99)
Triglycerides: 94 mg/dL (ref <150)
VLDL: 19 mg/dL (ref 0–40)

## 2016-03-11 MED ORDER — NONFORMULARY OR COMPOUNDED ITEM: Status: DC

## 2016-03-11 MED ORDER — MECLIZINE HCL 25 MG PO TABS: 25.0000 mg | ORAL_TABLET | Freq: Three times a day (TID) | ORAL | Status: DC | PRN

## 2016-03-11 NOTE — Care Management Obs Status (Signed)
[place] NOTIFICATION   Patient Details  Name: Shaun James MRN: [id number] Date of Birth: [birth year]   Medicare Observation Status Notification Given:  Yes    Pollie Friar, RN 03/11/2016, 11:18 AM

## 2016-03-11 NOTE — Discharge Instructions (Signed)
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
## 2016-03-11 NOTE — Progress Notes (Addendum)
Occupational Therapy Evaluation Patient Details Name: Shaun James MRN: [id number] DOB: 11/24/1930 Today's Date: 03/11/2016    History of Present Illness 82 y.o. male with a history of atrial fibrillation on anticoagulation, hypertension, hyperlipidemia, sick sinus syndrome with pacemaker implantation and coronary artery disease presenting with symptoms of vertigo.   Clinical Impression   Pt admitted with the above diagnoses and presents with below problem list. Pt will benefit from continued acute OT to address the below listed deficits and maximize independence with BADLs prior to d/c home. PTA pt was independent with ADLs. Pt is currently min guard to min A with LB ADLs and functional mobility. Pt initially ambulating to bathroom with no AD but did have 2 LOB episodes requiring min A and external support to correct. Positive for dizziness with movement. Rw utilized ambulating from bathroom to recliner with pt completing min guard. Educated on hand placement and general education regarding rw. Deferring decision of continued rw use at home to PT, discussed this with pt. Pt reports this was his first time OOB, that he recalls, since admission. Advised pt not to get up by himself but to call for assistance.      Follow Up Recommendations  Home health OT;Supervision/Assistance - 24 hour    Equipment Recommendations  3 in 1 bedside comode    Recommendations for Other Services PT consult     Precautions / Restrictions Precautions Precautions: Fall Restrictions Weight Bearing Restrictions: No      Mobility Bed Mobility Overal bed mobility: Needs Assistance Bed Mobility: Supine to Sit     Supine to sit: Min guard     General bed mobility comments: extra effort and close min guard.   Transfers Overall transfer level: Needs assistance Equipment used: Rolling walker (2 wheeled);None Transfers: Sit to/from Stand Sit to Stand: Min guard;Min assist         General transfer  comment: no rw from EOB and onto regular height toilet (grab bars used). RW used for standing from toilet and sitting into recliner.     Balance Overall balance assessment: Needs assistance Sitting-balance support: No upper extremity supported Sitting balance-Leahy Scale: Good Sitting balance - Comments: Pt donned socks sitting EOB.    Standing balance support: Bilateral upper extremity supported;No upper extremity supported Standing balance-Leahy Scale: Poor                              ADL Overall ADL's : Needs assistance/impaired Eating/Feeding: Set up;Sitting   Grooming: Set up;Sitting   Upper Body Bathing: Set up;Sitting   Lower Body Bathing: Minimal assistance;Sit to/from stand   Upper Body Dressing : Set up;Sitting   Lower Body Dressing: Minimal assistance;Sit to/from stand   Toilet Transfer: Ambulation;Regular Toilet;Grab bars;Minimal assistance Toilet Transfer Details (indicate cue type and reason): Pt + for dizziness during toilet transfer after ambulating to bathroom. 2 loss of balances ambulating bed to bathroom. Cues for technique and use of grab bar. Initially no AD used for walking. Rw utilitized walking from bed to recliner with no loss of balance.  Toileting- Clothing Manipulation and Hygiene: Sitting/lateral lean;Set up   Tub/ Shower Transfer: Walk-in shower;Minimal assistance;Ambulation;Shower seat   Functional mobility during ADLs: Minimal assistance;Min guard;Rolling walker General ADL Comments: Pt ambulated to bathroom to complete toilet transfer with no AD used for ambualtion. Pt with 2 LOB on the way to the bathroom with pt seeking external support and needing A from therapist to regain balance. RW  used ambulating from toilet to recliner with no LOB. Cues for safe technique with rw. + dizziness with movement. Pt in recliner with chair alarm at end of session. Educated on calling for assistance to get up and not to get up by himself.      Vision      Perception     Praxis      Pertinent Vitals/Pain Pain Assessment: No/denies pain     Hand Dominance     Extremity/Trunk Assessment Upper Extremity Assessment Upper Extremity Assessment: Overall WFL for tasks assessed   Lower Extremity Assessment Lower Extremity Assessment: Defer to PT evaluation       Communication Communication Communication: HOH   Cognition Arousal/Alertness: Awake/alert Behavior During Therapy: WFL for tasks assessed/performed Overall Cognitive Status: Within Functional Limits for tasks assessed       Memory: Decreased short-term memory (Pt reports baseline memory problems.)             General Comments       Exercises       Shoulder Instructions      Home Living Family/patient expects to be discharged to:: Private residence Living Arrangements: Spouse/significant other Available Help at Discharge: Family;Available 24 hours/day Type of Home: House Home Access: Stairs to enter [employer] of Steps: 3 Entrance Stairs-Rails: Right Home Layout: Two level;Able to live on main level with bedroom/bathroom     Bathroom Shower/Tub: Occupational psychologist: Standard     Home Equipment: Shower seat - built in          Prior Functioning/Environment Level of Independence: Independent             OT Diagnosis: Generalized weakness;Other (comment) (dizziness)   OT Problem List: Decreased activity tolerance;Impaired balance (sitting and/or standing);Decreased knowledge of use of DME or AE;Decreased knowledge of precautions   OT Treatment/Interventions: Self-care/ADL training;DME and/or AE instruction;Therapeutic activities;Patient/family education;Balance training    OT Goals(Current goals can be found in the care plan section) Acute Rehab OT Goals Patient Stated Goal: go home and return to independence OT Goal Formulation: With patient Time For Goal Achievement: 03/18/16 Potential to Achieve Goals:  Good ADL Goals Pt Will Perform Grooming: with modified independence;standing Pt Will Perform Lower Body Bathing: with modified independence;sit to/from stand Pt Will Perform Lower Body Dressing: with modified independence;sit to/from stand Pt Will Transfer to Toilet: with modified independence;ambulating Pt Will Perform Toileting - Clothing Manipulation and hygiene: with modified independence;sitting/lateral leans;sit to/from stand Pt Will Perform Tub/Shower Transfer: Shower transfer;with modified independence;shower seat;ambulating  OT Frequency: Min 2X/week   Barriers to D/C:            Co-evaluation              End of Session Equipment Utilized During Treatment: Gait belt;Rolling walker Nurse Communication: Mobility status;Other (comment) (2 LOB ambulating without rw)  Activity Tolerance: Patient tolerated treatment well;Other (comment) (+ dizziness with movement) Patient left: in chair;with call bell/phone within reach;with chair alarm set   Time: [phone] OT Time Calculation (min): 24 min Charges:  OT General Charges $OT Visit: 1 Procedure OT Evaluation $OT Eval Low Complexity: 1 Procedure OT Treatments $Self Care/Home Management : 8-22 mins G-Codes: OT G-codes **NOT FOR INPATIENT CLASS** Functional Assessment Tool Used: clinical judgment Functional Limitation: Self care Self Care Current Status [id number]): At least 1 percent but less than 20 percent impaired, limited or restricted Self Care Goal Status [id number]): At least 1 percent but less than 20 percent impaired, limited or restricted  Hortencia Pilar 03/11/2016, 10:32 AM

## 2016-03-11 NOTE — Discharge Summary (Signed)
Physician Discharge Summary  [name] [id number] DOB: [birth year] DOA: 03/10/2016  PCP: Binnie Rail, MD  Admit date: 03/10/2016 Discharge date: 03/11/2016  Admitted From: home  Disposition:  home  Recommendations for Outpatient Follow-up:  1. Follow up with PCP in 1-2 weeks 2. Outpatient vestibular rehabilitation 3. Vascular surgery follow up in 3 months  Home Health:   no Equipment/Devices:  3-in-1 BSC Discharge Condition:  Stable, improved CODE STATUS:  full  Diet recommendation:  Healthy heart   Brief/Interim Summary:  Shaun James is a 82 y.o. male with medical history significant for HTN, CAD s/p CABG, s/p PMP for SSS, OA, GERD, prior h/o PUD, and Benign Positional vertigo, last episode 2-3 years ago, Who presented with sudden onset of dizziness when trying to sit or stand which occurred around 10 AM when he was trying to get out of his car. Resting did not relieve his dizziness. He presented to the ER with ongoing dizziness that was worse with movement. He denied focal numbness or weakness or syncope. He denied chest pain, shortness of breath, leg swelling or recent surgeries or long trips. In the emergency department, his vital signs were stable. His head CT was negative for acute intracranial abnormalities. EKG demonstrated no ischemic changes and he was in sinus rhythm. His troponin was negative. There is no evidence of infection on urinalysis. His electrolytes and CBC were normal. He was admitted for observation due to ongoing severe vertigo to be seen by neurology and to have his symptoms managed.  Discharge Diagnoses:  Principal Problem:   BPPV (benign paroxysmal positional vertigo) Active Problems:   Hyperlipidemia with target LDL less than 70   Pulmonary fibrosis (HCC)   Atrial flutter / Atrial Fibrillation   CAD (coronary artery disease), autologous vein bypass graft   Pacemaker   Vertigo   Aneurysm, cerebral, nonruptured   Carotid artery stenosis  Probable  BPPV.  The differential diagnosis included viral illness, medication side effect from over the counter medication to improve his focus, or less likely symptoms of aneurysm rupture or TIA/stroke. He has a history of previous BPPV. He was seen by neurology who agreed that his symptoms were consistent with BPPV and his vertigo spontaneously resolved overnight.  He had no evidence of infection on his urinalysis.  We were unable to fully exclude stroke due to inability to perform MRI due to pacemaker, however, his CT head demonstrated no evidence of hemorrhage or stroke.  His orthostatics were negative.  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 The neurologist did not feel that this was contributing to his symptoms.  The case was discussed with Dr. Estanislado Pandy who will schedule the patient for angiography on Monday to address these problems.  He was seen by physical therapy who recommended outpatient vestibular rehabilitation and I have given him a prescription for meclizine to use as needed at home.    CAD, chest pain free.  He continued eliquis, statin, and ranexa  Atrial Fibrillation CHA2DS2-VASc score 4 , on anticoagulation with Eliquis.  Rate controlled.  Continued Cardizem  Hypertension, blood pressure well controlled on diltiazem and ARB.  BPH, stable, continue Flomax   Discharge Instructions      Discharge Instructions    Call MD for:  difficulty breathing, headache or visual disturbances    Complete by:  As directed      Call MD for:  extreme fatigue    Complete by:  As directed      Call MD for:  hives    Complete by:  As directed      Call MD for:  persistant dizziness or light-headedness    Complete by:  As directed      Call MD for:  persistant nausea and vomiting    Complete by:  As  directed      Call MD for:  severe uncontrolled pain    Complete by:  As directed      Call MD for:  temperature >100.4    Complete by:  As directed      Diet - low sodium heart healthy    Complete by:  As directed      Discharge instructions    Complete by:  As directed   You were hospitalized with dizziness which was likely caused by benign positional vertigo, or a small calcium stone in the inner ear.  Please continue to take your meclizine for dizziness as needed.  Please take your eliquis and your cholesterol medication as before.  As the Neurologist mentioned to you, you have a small aneurysm and a blockage of one of your carotid arteries.  You will need to follow up with one of the neuroradiologists next week for further testing and to possibly fix your aneurysm and open up your carotid artery.  Dr. Jackquline Berlin has your name and contact information and is expecting a call from you or your family to schedule an angiogram with him next week.  Anderson Malta is his scheduler and you can reach her at [phone] or at [phone].  You also need to follow up with your primary care doctor to make sure that the pulmonary nodule that we saw on your chest x-ray is the same nodule that has been followed by her over the last few years.     Increase activity slowly    Complete by:  As directed             Medication List    STOP taking these medications        OVER THE COUNTER MEDICATION      TAKE these medications        acetaminophen 500 MG tablet  Commonly known as:  TYLENOL  Take 1 tablet (500 mg total) by mouth daily as needed for headache.     apixaban 2.5 MG Tabs tablet  Commonly known as:  ELIQUIS  Take 1 tablet (2.5 mg total) by mouth 2 (two) times daily.     diltiazem 120 MG 24 hr capsule  Commonly known as:  CARTIA XT  Take 1 capsule (120 mg total) by mouth daily.     meclizine 25 MG tablet  Commonly known as:  ANTIVERT  Take 1 tablet (25 mg total) by mouth 3 (three) times  daily as needed for dizziness.     nitroGLYCERIN 0.4 MG SL tablet  Commonly known as:  NITROSTAT  PLACE 1 TABLET UNDER THE TONGUE EVERY 5 MINUTES AS NEEDED FOR CHEST PAIN     NONFORMULARY OR COMPOUNDED ITEM  Vestibular rehabilitation.  Outpatient physical and occupational therapy, assess and treat.  Indication:  Vertigo/BPPV     prednisoLONE acetate 1 % ophthalmic suspension  Commonly known as:  PRED FORTE  Place 1 drop into both eyes 4 (four) times daily.     ranolazine 500 MG 12 hr tablet  Commonly known as:  RANEXA  Take 1 tablet (500 mg total) by mouth 2 (two) times daily.     simvastatin 20 MG tablet  Commonly known as:  ZOCOR  Take 1 tablet (20 mg total) by mouth every evening.     tamsulosin 0.4 MG Caps capsule  Commonly known as:  FLOMAX  Take 0.4 mg by mouth daily.     valsartan 320 MG tablet  Commonly known as:  DIOVAN  TAKE ONE-HALF (1/2) TABLET DAILY       Follow-up Information    Follow up with Binnie Rail, MD. Schedule an appointment as soon as possible for a visit in 2 weeks.   Specialty:  Internal Medicine   Contact information:   [place] [address] [phone]       Follow up with DEVESHWAR, Fritz Pickerel, MD. Call today.   Specialty:  Interventional Radiology   Why:  small aneurysm and blockage of carotid artery   Contact information:   [address] [place] [address] [phone]      Allergies  Allergen Reactions  . Codeine Rash  . Penicillins Rash  . Zyrtec [Cetirizine Hcl] Rash         Consultations: Neurology, Dr. Wallie Char  Procedures/Studies: Ct Angio Head W/cm &/or Wo Cm  03/10/2016  CLINICAL DATA:  New onset dizziness beginning at 10 a.m. today. Patient has a history of benign positional vertigo. Today's symptoms are atypical. EXAM: CT ANGIOGRAPHY HEAD AND NECK TECHNIQUE: Multidetector CT imaging of the head and neck was performed using the standard protocol during bolus administration of  intravenous contrast. Multiplanar CT image reconstructions and MIPs were obtained to evaluate the vascular anatomy. Carotid stenosis measurements (when applicable) are obtained utilizing NASCET criteria, using the distal internal carotid diameter as the denominator. CONTRAST:  50 mL Isovue 370 COMPARISON:  CT head without contrast from the same day. FINDINGS: CTA NECK Aortic arch: A 3 vessel arch configuration is present. Atherosclerotic calcifications are present at the origins of the great vessels without a significant stenosis. Right carotid system: The right common carotid artery is within normal limits. Atherosclerotic calcifications are present at the right carotid bifurcation without a significant stenosis. Cervical right ICA is within normal limits. Left carotid system: The left common carotid artery is tortuous proximally without a significant stenosis. Atherosclerotic changes are present at the left carotid bifurcation without a significant stenosis relative to the more distal vessel. There is some tortuosity of the cervical left ICA. Vertebral arteries:Vertebral arteries originate from the subclavian arteries. There is a moderate stenosis at the origin of the non dominant left vertebral artery. There is no stenosis on the right. The vertebral arteries demonstrate moderate tortuosity without focal stenosis. Hypodensity across the left vertebral artery at the C2 level is artifactual. Skeleton: Multilevel facet degenerative changes are most significant on the right at C2-3 and on the left at C3-4 and C4-5. Endplate changes and spurring are greatest on the left at C3-4 with severe left foraminal stenosis at that level. No focal lytic or blastic lesions are present. The calvarium is intact. Other neck: No focal mucosal or submucosal lesions are present. Vocal cords are midline and symmetric. The salivary glands are within normal limits. The thyroid is unremarkable. No significant adenopathy is present. CTA  HEAD Anterior circulation: Dense atherosclerotic calcifications are present within the cavernous internal carotid arteries bilaterally. There is a moderate to high-grade stenosis within the ophthalmic segment of the left internal  carotid artery. 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 There is very little flow within the left A1 segment which may be occluded. The A2 segment is azygos. It fills from the right. The right A1 segment is within normal limits. Both M1 segments are unremarkable. MCA bifurcations are intact. ACA and MCA branch vessels are within normal limits. Posterior circulation: The right vertebral artery is the dominant vessel. PICA origins are visualized and normal bilaterally. The basilar artery is within normal limits. Both posterior cerebral arteries originate from the basilar tip. Prominent posterior communicating arteries are noted as well. The PCA branch vessels are intact. Venous sinuses: The dural sinuses are patent. The straight sinus and deep cerebral veins are intact. The right transverse sinus is dominant. Cortical veins are unremarkable. Anatomic variants: Prominent posterior communicating arteries are present bilaterally. Delayed phase: Not performed. IMPRESSION: 1. Moderate to high-grade stenosis of the left internal carotid artery at the ophthalmic segment. 2. 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 3. Hypoplastic or occluded left A1 segment. 4. Atherosclerotic calcifications at the carotid bifurcations bilaterally without significant stenosis. 5. Moderate stenosis at the origin of the non dominant left vertebral artery. 6. Atherosclerotic changes at the origins the great vessels without stenosis. 7. Multilevel spondylosis of the cervical spine with severe left foraminal stenosis at C3-4. Electronically Signed   By: San Morelle M.D.   On: 03/10/2016 18:41   Dg Chest 2 View  03/10/2016  CLINICAL DATA:  Dizziness and nausea beginning this  morning. EXAM: CHEST  2 VIEW COMPARISON:  11/26/2014 FINDINGS: The lungs are clear wiithout focal pneumonia, edema, pneumothorax or pleural effusion. There is a small nodule in the right apex which was not present on the previous study or an exam from 06/26/2014. Calcified pleural plaques noted bilaterally. The cardiopericardial silhouette is within normal limits for size. Patient is status post CABG. Left delete permanent pacemaker remains in place. The visualized bony structures of the thorax are intact. IMPRESSION: New tiny nodule in the right upper lobe (superimposed on anterior first rib). CT chest without contrast recommended to further evaluate. Otherwise unremarkable. Electronically Signed   By: Misty Stanley M.D.   On: 03/10/2016 18:17   Ct Angio Neck W/cm &/or Wo/cm  03/10/2016  CLINICAL DATA:  New onset dizziness beginning at 10 a.m. today. Patient has a history of benign positional vertigo. Today's symptoms are atypical. EXAM: CT ANGIOGRAPHY HEAD AND NECK TECHNIQUE: Multidetector CT imaging of the head and neck was performed using the standard protocol during bolus administration of intravenous contrast. Multiplanar CT image reconstructions and MIPs were obtained to evaluate the vascular anatomy. Carotid stenosis measurements (when applicable) are obtained utilizing NASCET criteria, using the distal internal carotid diameter as the denominator. CONTRAST:  50 mL Isovue 370 COMPARISON:  CT head without contrast from the same day. FINDINGS: CTA NECK Aortic arch: A 3 vessel arch configuration is present. Atherosclerotic calcifications are present at the origins of the great vessels without a significant stenosis. Right carotid system: The right common carotid artery is within normal limits. Atherosclerotic calcifications are present at the right carotid bifurcation without a significant stenosis. Cervical right ICA is within normal limits. Left carotid system: The left common carotid artery is tortuous  proximally without a significant stenosis. Atherosclerotic changes are present at the left carotid bifurcation without a significant stenosis relative to the more distal vessel. There is some tortuosity of the cervical left ICA. Vertebral arteries:Vertebral arteries originate from the subclavian arteries. There is a  moderate stenosis at the origin of the non dominant left vertebral artery. There is no stenosis on the right. The vertebral arteries demonstrate moderate tortuosity without focal stenosis. Hypodensity across the left vertebral artery at the C2 level is artifactual. Skeleton: Multilevel facet degenerative changes are most significant on the right at C2-3 and on the left at C3-4 and C4-5. Endplate changes and spurring are greatest on the left at C3-4 with severe left foraminal stenosis at that level. No focal lytic or blastic lesions are present. The calvarium is intact. Other neck: No focal mucosal or submucosal lesions are present. Vocal cords are midline and symmetric. The salivary glands are within normal limits. The thyroid is unremarkable. No significant adenopathy is present. CTA HEAD Anterior circulation: Dense atherosclerotic calcifications are present within the cavernous internal carotid arteries bilaterally. There is a moderate to high-grade stenosis within the ophthalmic segment of the left internal carotid artery. 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 There is very little flow within the left A1 segment which may be occluded. The A2 segment is azygos. It fills from the right. The right A1 segment is within normal limits. Both M1 segments are unremarkable. MCA bifurcations are intact. ACA and MCA branch vessels are within normal limits. Posterior circulation: The right vertebral artery is the dominant vessel. PICA origins are visualized and normal bilaterally. The basilar artery is within normal limits. Both posterior cerebral arteries originate from the basilar tip.  Prominent posterior communicating arteries are noted as well. The PCA branch vessels are intact. Venous sinuses: The dural sinuses are patent. The straight sinus and deep cerebral veins are intact. The right transverse sinus is dominant. Cortical veins are unremarkable. Anatomic variants: Prominent posterior communicating arteries are present bilaterally. Delayed phase: Not performed. IMPRESSION: 1. Moderate to high-grade stenosis of the left internal carotid artery at the ophthalmic segment. 2. 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 3. Hypoplastic or occluded left A1 segment. 4. Atherosclerotic calcifications at the carotid bifurcations bilaterally without significant stenosis. 5. Moderate stenosis at the origin of the non dominant left vertebral artery. 6. Atherosclerotic changes at the origins the great vessels without stenosis. 7. Multilevel spondylosis of the cervical spine with severe left foraminal stenosis at C3-4. Electronically Signed   By: San Morelle M.D.   On: 03/10/2016 18:41   Ct Head Code Stroke W/o Cm  03/10/2016  CLINICAL DATA:  Vertigo EXAM: CT HEAD WITHOUT CONTRAST TECHNIQUE: Contiguous axial images were obtained from the base of the skull through the vertex without intravenous contrast. COMPARISON:  CT head 11/24/2012 FINDINGS: Mild atrophy. Chronic microvascular ischemic change in the white matter. Negative for acute infarct.  Negative for hemorrhage or mass. Negative calvarium. IMPRESSION: Atrophy and chronic microvascular ischemia.  No acute abnormality. Electronically Signed   By: Franchot Gallo M.D.   On: 03/10/2016 14:30    Subjective: Feeling back to normal.  Denies dizziness or lightheadedness today.  Would like to go home.  No focal numbness or weakness.    Discharge Exam: Filed Vitals:   03/11/16 1014 03/11/16 1343  BP: 147/65 127/75  Pulse: 55 64  Temp: 97.7 F (36.5 C) 97.8 F (36.6 C)  Resp: 18 18   Filed Vitals:   03/11/16 0500 03/11/16 0651  03/11/16 1014 03/11/16 1343  BP: 126/78 130/75 147/65 127/75  Pulse: 66 56 55 64  Temp: 97.7 F (36.5 C) 97.3 F (36.3 C) 97.7 F (36.5 C) 97.8 F (36.6 C)  TempSrc: Oral Oral Oral Oral  Resp:   18 18  Height:      Weight:      SpO2: 95% 97% 100% 98%    General: Pt is alert, awake, not in acute distress Cardiovascular: IRRR, S1/S2 +, no rubs, no gallops, 2/6 systolic murmur Respiratory: CTA bilaterally, no wheezing, no rhonchi Abdominal: Soft, NT, ND, bowel sounds + Extremities: no edema, no cyanosis Neuro:  CN II-XII grossly intact except patient is hard of hearing.  Was unable to induce nystagmus or vertigo with head turn side to side or up and down or sitting up or lying down.  Strength 5/5 throughout, sensation intact to light touch.    The results of significant diagnostics from this hospitalization (including imaging, microbiology, ancillary and laboratory) are listed below for reference.     Microbiology: No results found for this or any previous visit (from the past 240 hour(s)).   Labs: BNP (last 3 results) No results for input(s): BNP in the last 8760 hours. Basic Metabolic Panel:  Recent Labs Lab 03/10/16 1348  NA 138  K 4.3  CL 105  CO2 26  GLUCOSE 96  BUN 11  CREATININE 0.97  CALCIUM 9.5   Liver Function Tests:  Recent Labs Lab 03/10/16 1348  AST 21  ALT 15*  ALKPHOS 56  BILITOT 0.8  PROT 7.0  ALBUMIN 4.2   No results for input(s): LIPASE, AMYLASE in the last 168 hours. No results for input(s): AMMONIA in the last 168 hours. CBC:  Recent Labs Lab 03/10/16 1348  WBC 5.6  NEUTROABS 3.0  HGB 14.7  HCT 42.3  MCV 91.0  PLT 190   Cardiac Enzymes: No results for input(s): CKTOTAL, CKMB, CKMBINDEX, TROPONINI in the last 168 hours. BNP: Invalid input(s): POCBNP CBG: No results for input(s): GLUCAP in the last 168 hours. D-Dimer No results for input(s): DDIMER in the last 72 hours. Hgb A1c No results for input(s): HGBA1C in the last  72 hours. Lipid Profile  Recent Labs  03/11/16 0538  CHOL 117  HDL 34*  LDLCALC 64  TRIG 94  CHOLHDL 3.4   Thyroid function studies No results for input(s): TSH, T4TOTAL, T3FREE, THYROIDAB in the last 72 hours.  Invalid input(s): FREET3 Anemia work up No results for input(s): VITAMINB12, FOLATE, FERRITIN, TIBC, IRON, RETICCTPCT in the last 72 hours. Urinalysis    Component Value Date/Time   COLORURINE YELLOW 03/10/2016 [place] 03/10/2016 1358   LABSPEC 1.012 03/10/2016 1358   PHURINE 7.5 03/10/2016 1358   GLUCOSEU NEGATIVE 03/10/2016 1358   GLUCOSEU NEGATIVE 04/11/2015 0942   HGBUR NEGATIVE 03/10/2016 1358   BILIRUBINUR NEGATIVE 03/10/2016 1358   BILIRUBINUR negative 07/31/2013 1630   KETONESUR NEGATIVE 03/10/2016 1358   PROTEINUR NEGATIVE 03/10/2016 1358   PROTEINUR negative 07/31/2013 1630   UROBILINOGEN 0.2 04/11/2015 0942   UROBILINOGEN negative 07/31/2013 1630   NITRITE NEGATIVE 03/10/2016 1358   NITRITE negative 07/31/2013 1630   LEUKOCYTESUR NEGATIVE 03/10/2016 1358   Sepsis Labs Invalid input(s): PROCALCITONIN,  WBC,  LACTICIDVEN Microbiology No results found for this or any previous visit (from the past 240 hour(s)).   Time coordinating discharge: Over 30 minutes  SIGNED:   Janece Canterbury, MD  [facility] 03/11/2016, 2:32 PM Pager   If 7PM-7AM, please contact night-coverage [email or web address] Password [email or web address]

## 2016-03-11 NOTE — Progress Notes (Signed)
SLP Cancellation Note  Patient Details Name: Shaun James MRN: [id number] DOB: 20-Jul-1931   Cancelled treatment:       Reason Eval/Treat Not Completed: SLP screened, no needs identified, will sign off. Pt has passed the RN stroke swallow screen and RN reports good tolerance of current diet. Therefore, will defer SLP swallow evaluation. Please re-order if needed.   Germain Osgood, M.A. CCC-SLP [phone]  Germain Osgood 03/11/2016, 11:57 AM

## 2016-03-11 NOTE — Care Management Note (Signed)
Case Management Note  Patient Details  Name: Shaun James MRN: [id number] Date of Birth: 02-22-1931  Subjective/Objective:   Pt in with Vertigo. He is from home with his wife.                 Action/Plan: PT recommending outpatient vestibular therapy. CM following for d/c needs.   Expected Discharge Date:                  Expected Discharge Plan:  Home/Self Care  In-House Referral:     Discharge planning Services     Post Acute Care Choice:    Choice offered to:     DME Arranged:    DME Agency:     HH Arranged:    HH Agency:     Status of Service:  In process, will continue to follow  If discussed at Long Length of Stay Meetings, dates discussed:    Additional Comments:  Pollie Friar, RN 03/11/2016, 11:49 AM

## 2016-03-11 NOTE — Progress Notes (Signed)
Pt discharged home with family. Discharge information given. IV and telemetry discontinued. Pt to follow up with Dr. Estanislado Pandy outpatient for possible surgery for stenosis and atherosclerosis. Patient questions asked and answered. Pt to be transported from unit with family. Wendee Copp

## 2016-03-13 ENCOUNTER — Other Ambulatory Visit: Payer: Self-pay | Admitting: Interventional Cardiology
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
## 2016-03-17 ENCOUNTER — Encounter: Payer: Self-pay | Admitting: Physician Assistant

## 2016-03-17 ENCOUNTER — Ambulatory Visit (INDEPENDENT_AMBULATORY_CARE_PROVIDER_SITE_OTHER): Payer: Medicare Other | Admitting: Physician Assistant

## 2016-03-17 ENCOUNTER — Encounter (INDEPENDENT_AMBULATORY_CARE_PROVIDER_SITE_OTHER): Payer: Self-pay

## 2016-03-17 ENCOUNTER — Ambulatory Visit (INDEPENDENT_AMBULATORY_CARE_PROVIDER_SITE_OTHER): Payer: Medicare Other | Admitting: Internal Medicine

## 2016-03-17 ENCOUNTER — Encounter: Payer: Self-pay | Admitting: Internal Medicine

## 2016-03-17 VITALS — BP 122/70 | HR 74 | Ht 62.0 in | Wt 134.0 lb

## 2016-03-17 VITALS — BP 120/76 | HR 72 | Temp 98.0°F | Resp 20 | Wt 136.0 lb

## 2016-03-17 DIAGNOSIS — I480 Paroxysmal atrial fibrillation: Secondary | ICD-10-CM | POA: Diagnosis not present

## 2016-03-17 DIAGNOSIS — H9193 Unspecified hearing loss, bilateral: Secondary | ICD-10-CM | POA: Diagnosis not present

## 2016-03-17 DIAGNOSIS — I2510 Atherosclerotic heart disease of native coronary artery without angina pectoris: Secondary | ICD-10-CM | POA: Diagnosis not present

## 2016-03-17 DIAGNOSIS — H8110 Benign paroxysmal vertigo, unspecified ear: Secondary | ICD-10-CM | POA: Diagnosis not present

## 2016-03-17 DIAGNOSIS — I6522 Occlusion and stenosis of left carotid artery: Secondary | ICD-10-CM

## 2016-03-17 DIAGNOSIS — I10 Essential (primary) hypertension: Secondary | ICD-10-CM | POA: Diagnosis not present

## 2016-03-17 DIAGNOSIS — I671 Cerebral aneurysm, nonruptured: Secondary | ICD-10-CM

## 2016-03-17 DIAGNOSIS — I495 Sick sinus syndrome: Secondary | ICD-10-CM

## 2016-03-17 MED ORDER — MECLIZINE HCL 25 MG PO TABS: 25.0000 mg | ORAL_TABLET | Freq: Three times a day (TID) | ORAL | Status: DC | PRN

## 2016-03-17 NOTE — Patient Instructions (Addendum)
Medication Instructions:   Your physician recommends that you continue on your current medications as directed. Please refer to the Current Medication list given to you today.    If you need a refill on your cardiac medications before your next appointment, please call your pharmacy.  Labwork: NONE ORDER TODAY   Testing/Procedures: NONE ORDER TODAY    Follow-Up: WITH DR Tamala Julian ASAP POSSIBLE  BASED UPON RECALL     RECALL ALREADY IN .Marland KitchenMarland KitchenYour physician wants you to follow-up in:  [place] will receive a reminder letter in the mail two months in advance. If you don't receive a letter, please call our office to schedule the follow-up appointment.  Remote monitoring is used to monitor your Pacemaker of ICD from home. This monitoring reduces the number of office visits required to check your device to one time per year. It allows [place] to keep an eye on the functioning of your device to ensure it is working properly. You are scheduled for a device check from home on . 06/16/2016.. You may send your transmission at any time that day. If you have a wireless device, the transmission will be sent automatically. After your physician reviews your transmission, you will receive a postcard with your next transmission date.     Any Other Special Instructions Will Be Listed Below (If Applicable).  MAKE SURE YOU TALKED TO YOUR PRIMARY CARE DR ABOUT NEUROLOGY AND MECLIZINE

## 2016-03-17 NOTE — Patient Instructions (Signed)
Please take all new medication as prescribed  - the meclizine (sent to [facility])  Please continue all other medications as before, and refills have been done if requested.  Please have the pharmacy call with any other refills you may need.  Please keep your appointments with your specialists as you may have planned - including Dr Kathee Delton for the aneurysm and carotid blockages  Please return in 3 mo to see Dr Quay Burow

## 2016-03-17 NOTE — Progress Notes (Signed)
Pre visit review using our clinic review tool, if applicable. No additional management support is needed unless otherwise documented below in the visit note. 
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
## 2016-03-22 DIAGNOSIS — H9193 Unspecified hearing loss, bilateral: Secondary | ICD-10-CM | POA: Insufficient documentation

## 2016-03-22 NOTE — Assessment & Plan Note (Signed)
Chronic, related to some degree of permanent hearing loss per pt, declines ent referral

## 2016-03-22 NOTE — Assessment & Plan Note (Signed)
Improved, to cont meclizine prn

## 2016-03-22 NOTE — Assessment & Plan Note (Signed)
For intervent radiology f/u as planned, stable

## 2016-03-23 ENCOUNTER — Ambulatory Visit (HOSPITAL_COMMUNITY)
Admission: RE | Admit: 2016-03-23 | Discharge: 2016-03-23 | Disposition: A | Discharge status: Home / Self Care | Payer: Medicare Other | Source: Ambulatory Visit | Attending: Interventional Radiology | Admitting: Interventional Radiology

## 2016-03-23 ENCOUNTER — Other Ambulatory Visit (HOSPITAL_COMMUNITY): Payer: Self-pay | Admitting: Interventional Radiology

## 2016-03-23 DIAGNOSIS — I671 Cerebral aneurysm, nonruptured: Secondary | ICD-10-CM

## 2016-03-23 DIAGNOSIS — R42 Dizziness and giddiness: Secondary | ICD-10-CM

## 2016-03-25 ENCOUNTER — Other Ambulatory Visit: Payer: Self-pay | Admitting: Radiology

## 2016-03-30 ENCOUNTER — Ambulatory Visit (HOSPITAL_COMMUNITY): Admission: RE | Admit: 2016-03-30 | Payer: Medicare Other | Source: Ambulatory Visit

## 2016-03-30 ENCOUNTER — Other Ambulatory Visit: Payer: Self-pay | Admitting: Radiology

## 2016-04-01 ENCOUNTER — Other Ambulatory Visit: Payer: Self-pay | Admitting: Radiology

## 2016-04-01 ENCOUNTER — Ambulatory Visit (HOSPITAL_COMMUNITY)
Admission: RE | Admit: 2016-04-01 | Discharge: 2016-04-01 | Disposition: A | Discharge status: Home / Self Care | Payer: Medicare Other | Source: Ambulatory Visit | Attending: Interventional Radiology | Admitting: Interventional Radiology

## 2016-04-01 MED ORDER — SODIUM CHLORIDE 0.9 % IV SOLN: Freq: Once | INTRAVENOUS | Status: DC

## 2016-04-03 ENCOUNTER — Other Ambulatory Visit: Payer: Self-pay | Admitting: Interventional Cardiology

## 2016-04-05 ENCOUNTER — Other Ambulatory Visit: Payer: Self-pay | Admitting: General Surgery

## 2016-04-06 ENCOUNTER — Other Ambulatory Visit (HOSPITAL_COMMUNITY): Payer: Self-pay | Admitting: Interventional Radiology

## 2016-04-06 ENCOUNTER — Ambulatory Visit (HOSPITAL_COMMUNITY)
Admission: RE | Admit: 2016-04-06 | Discharge: 2016-04-06 | Disposition: A | Discharge status: Home / Self Care | Payer: Medicare Other | Source: Ambulatory Visit | Attending: Interventional Radiology | Admitting: Interventional Radiology

## 2016-04-06 ENCOUNTER — Encounter (HOSPITAL_COMMUNITY): Payer: Self-pay

## 2016-04-06 DIAGNOSIS — R42 Dizziness and giddiness: Secondary | ICD-10-CM

## 2016-04-06 DIAGNOSIS — Z880 Allergy status to penicillin: Secondary | ICD-10-CM | POA: Insufficient documentation

## 2016-04-06 DIAGNOSIS — Z7901 Long term (current) use of anticoagulants: Secondary | ICD-10-CM | POA: Insufficient documentation

## 2016-04-06 DIAGNOSIS — Z951 Presence of aortocoronary bypass graft: Secondary | ICD-10-CM | POA: Insufficient documentation

## 2016-04-06 DIAGNOSIS — N400 Enlarged prostate without lower urinary tract symptoms: Secondary | ICD-10-CM | POA: Insufficient documentation

## 2016-04-06 DIAGNOSIS — K219 Gastro-esophageal reflux disease without esophagitis: Secondary | ICD-10-CM | POA: Diagnosis not present

## 2016-04-06 DIAGNOSIS — I671 Cerebral aneurysm, nonruptured: Secondary | ICD-10-CM

## 2016-04-06 DIAGNOSIS — R011 Cardiac murmur, unspecified: Secondary | ICD-10-CM | POA: Diagnosis not present

## 2016-04-06 DIAGNOSIS — I252 Old myocardial infarction: Secondary | ICD-10-CM | POA: Diagnosis not present

## 2016-04-06 DIAGNOSIS — Z950 Presence of cardiac pacemaker: Secondary | ICD-10-CM | POA: Insufficient documentation

## 2016-04-06 DIAGNOSIS — Z87891 Personal history of nicotine dependence: Secondary | ICD-10-CM | POA: Insufficient documentation

## 2016-04-06 DIAGNOSIS — I4892 Unspecified atrial flutter: Secondary | ICD-10-CM | POA: Diagnosis not present

## 2016-04-06 DIAGNOSIS — Z85828 Personal history of other malignant neoplasm of skin: Secondary | ICD-10-CM | POA: Insufficient documentation

## 2016-04-06 DIAGNOSIS — I6522 Occlusion and stenosis of left carotid artery: Secondary | ICD-10-CM | POA: Insufficient documentation

## 2016-04-06 DIAGNOSIS — J309 Allergic rhinitis, unspecified: Secondary | ICD-10-CM | POA: Insufficient documentation

## 2016-04-06 DIAGNOSIS — I10 Essential (primary) hypertension: Secondary | ICD-10-CM | POA: Insufficient documentation

## 2016-04-06 DIAGNOSIS — M1990 Unspecified osteoarthritis, unspecified site: Secondary | ICD-10-CM | POA: Insufficient documentation

## 2016-04-06 DIAGNOSIS — E782 Mixed hyperlipidemia: Secondary | ICD-10-CM | POA: Diagnosis not present

## 2016-04-06 DIAGNOSIS — I714 Abdominal aortic aneurysm, without rupture: Secondary | ICD-10-CM | POA: Diagnosis not present

## 2016-04-06 DIAGNOSIS — Z8711 Personal history of peptic ulcer disease: Secondary | ICD-10-CM | POA: Insufficient documentation

## 2016-04-06 DIAGNOSIS — I2510 Atherosclerotic heart disease of native coronary artery without angina pectoris: Secondary | ICD-10-CM | POA: Diagnosis not present

## 2016-04-06 DIAGNOSIS — Z8249 Family history of ischemic heart disease and other diseases of the circulatory system: Secondary | ICD-10-CM | POA: Insufficient documentation

## 2016-04-06 DIAGNOSIS — I4891 Unspecified atrial fibrillation: Secondary | ICD-10-CM | POA: Diagnosis not present

## 2016-04-06 DIAGNOSIS — H8110 Benign paroxysmal vertigo, unspecified ear: Secondary | ICD-10-CM | POA: Insufficient documentation

## 2016-04-06 DIAGNOSIS — K648 Other hemorrhoids: Secondary | ICD-10-CM | POA: Diagnosis not present

## 2016-04-06 HISTORY — PX: IR GENERIC HISTORICAL: IMG1180011

## 2016-04-06 LAB — APTT: aPTT: 30 seconds (ref 24–37)

## 2016-04-06 LAB — CBC
HCT: 41.6 % (ref 39.0–52.0)
HEMOGLOBIN: 14.2 g/dL (ref 13.0–17.0)
MCH: 30.8 pg (ref 26.0–34.0)
MCHC: 34.1 g/dL (ref 30.0–36.0)
MCV: 90.2 fL (ref 78.0–100.0)
PLATELETS: 201 10*3/uL (ref 150–400)
RBC: 4.61 MIL/uL (ref 4.22–5.81)
RDW: 12.6 % (ref 11.5–15.5)
WBC: 10.3 10*3/uL (ref 4.0–10.5)

## 2016-04-06 LAB — BASIC METABOLIC PANEL
Anion gap: 7 (ref 5–15)
BUN: 11 mg/dL (ref 6–20)
CHLORIDE: 104 mmol/L (ref 101–111)
CO2: 25 mmol/L (ref 22–32)
Calcium: 9.1 mg/dL (ref 8.9–10.3)
Creatinine, Ser: 1.04 mg/dL (ref 0.61–1.24)
GFR calc Af Amer: >60 mL/min (ref >60)
GFR calc non Af Amer: >60 mL/min (ref >60)
GLUCOSE: 112 mg/dL — AB (ref 65–99)
POTASSIUM: 4 mmol/L (ref 3.5–5.1)
Sodium: 136 mmol/L (ref 135–145)

## 2016-04-06 LAB — PROTIME-INR
INR: 1.13 (ref 0.00–1.49)
PROTHROMBIN TIME: 14.7 s (ref 11.6–15.2)

## 2016-04-06 MED ORDER — ASPIRIN 325 MG PO TABS
ORAL_TABLET | ORAL | Status: AC
  Filled 2016-04-06: qty 1

## 2016-04-06 MED ORDER — LIDOCAINE HCL 1 % IJ SOLN
INTRAMUSCULAR | Status: AC
  Administered 2016-04-06: 10 mL
  Filled 2016-04-06: qty 20

## 2016-04-06 MED ORDER — SODIUM CHLORIDE 0.9 % IV SOLN
INTRAVENOUS | Status: DC
  Administered 2016-04-06: 07:00:00 via INTRAVENOUS

## 2016-04-06 MED ORDER — FENTANYL CITRATE (PF) 100 MCG/2ML IJ SOLN
INTRAMUSCULAR | Status: AC | PRN
  Administered 2016-04-06 (×2): 25 ug via INTRAVENOUS

## 2016-04-06 MED ORDER — ASPIRIN 325 MG PO TABS
325.0000 mg | ORAL_TABLET | Freq: Every day | ORAL | Status: DC
  Administered 2016-04-06: 325 mg via ORAL

## 2016-04-06 MED ORDER — HEPARIN SODIUM (PORCINE) 1000 UNIT/ML IJ SOLN
INTRAMUSCULAR | Status: AC | PRN
  Administered 2016-04-06: 1000 [IU] via INTRAVENOUS

## 2016-04-06 MED ORDER — MIDAZOLAM HCL 2 MG/2ML IJ SOLN
INTRAMUSCULAR | Status: AC | PRN
  Administered 2016-04-06: 0.5 mg via INTRAVENOUS
  Administered 2016-04-06: 1 mg via INTRAVENOUS

## 2016-04-06 MED ORDER — IOPAMIDOL (ISOVUE-300) INJECTION 61%
INTRAVENOUS | Status: AC
  Administered 2016-04-06: 90 mL
  Filled 2016-04-06: qty 150

## 2016-04-06 MED ORDER — FENTANYL CITRATE (PF) 100 MCG/2ML IJ SOLN
INTRAMUSCULAR | Status: AC
  Filled 2016-04-06: qty 2

## 2016-04-06 MED ORDER — MIDAZOLAM HCL 2 MG/2ML IJ SOLN
INTRAMUSCULAR | Status: AC
  Filled 2016-04-06: qty 2

## 2016-04-06 MED ORDER — HEPARIN SODIUM (PORCINE) 1000 UNIT/ML IJ SOLN
INTRAMUSCULAR | Status: AC
  Filled 2016-04-06: qty 1

## 2016-04-06 MED ORDER — DILTIAZEM HCL ER COATED BEADS 240 MG PO CP24
240.0000 mg | ORAL_CAPSULE | Freq: Once | ORAL | Status: AC
Start: 2016-04-06 — End: 2016-04-06
  Administered 2016-04-06: 240 mg via ORAL
  Filled 2016-04-06: qty 1

## 2016-04-06 MED ORDER — SODIUM CHLORIDE 0.9 % IV SOLN: INTRAVENOUS | Status: AC

## 2016-04-06 MED ORDER — RANOLAZINE ER 500 MG PO TB12
500.0000 mg | ORAL_TABLET | Freq: Two times a day (BID) | ORAL | Status: DC
  Administered 2016-04-06: 500 mg via ORAL
  Filled 2016-04-06: qty 1

## 2016-04-06 NOTE — Discharge Instructions (Signed)
Angiogram, Care After °Refer to this sheet in the next few weeks. These instructions provide you with information about caring for yourself after your procedure. Your health care provider may also give you more specific instructions. Your treatment has been planned according to current medical practices, but problems sometimes occur. Call your health care provider if you have any problems or questions after your procedure. °WHAT TO EXPECT AFTER THE PROCEDURE °After your procedure, it is typical to have the following: °· Bruising at the catheter insertion site that usually fades within 1-2 weeks. °· Blood collecting in the tissue (hematoma) that may be painful to the touch. It should usually decrease in size and tenderness within 1-2 weeks. °HOME CARE INSTRUCTIONS °· Take medicines only as directed by your health care provider. °· You may shower 24-48 hours after the procedure or as directed by your health care provider. Remove the bandage (dressing) and gently wash the site with plain soap and water. Pat the area dry with a clean towel. Do not rub the site, because this may cause bleeding. °· Do not take baths, swim, or use a hot tub until your health care provider approves. °· Check your insertion site every day for redness, swelling, or drainage. °· Do not apply powder or lotion to the site. °· Do not lift over 10 lb (4.5 kg) for 5 days after your procedure or as directed by your health care provider. °· Ask your health care provider when it is okay to: °¨ Return to work or school. °¨ Resume usual physical activities or sports. °¨ Resume sexual activity. °· Do not drive home if you are discharged the same day as the procedure. Have someone else drive you. °· You may drive 24 hours after the procedure unless otherwise instructed by your health care provider. °· Do not operate machinery or power tools for 24 hours after the procedure or as directed by your health care provider. °· If your procedure was done as an  outpatient procedure, which means that you went home the same day as your procedure, a responsible adult should be with you for the first 24 hours after you arrive home. °· Keep all follow-up visits as directed by your health care provider. This is important. °SEEK MEDICAL CARE IF: °· You have a fever. °· You have chills. °· You have increased bleeding from the catheter insertion site. Hold pressure on the site. °SEEK IMMEDIATE MEDICAL CARE IF: °· You have unusual pain at the catheter insertion site. °· You have redness, warmth, or swelling at the catheter insertion site. °· You have drainage (other than a small amount of blood on the dressing) from the catheter insertion site. °· The catheter insertion site is bleeding, and the bleeding does not stop after 30 minutes of holding steady pressure on the site. °· The area near or just beyond the catheter insertion site becomes pale, cool, tingly, or numb. °  °This information is not intended to replace advice given to you by your health care provider. Make sure you discuss any questions you have with your health care provider. °  °Document Released: 03/18/2005 Document Revised: 09/20/2014 Document Reviewed: 01/31/2013 °Elsevier Interactive Patient Education ©2016 [employer] ° °

## 2016-04-06 NOTE — H&P (Signed)
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 James and look forward to participating in their care.  A copy of this report was sent to the requesting provider on this date.  Electronically Signed: Murrell Redden PA-C 04/06/2016, 7:45 AM   I spent a total of  30 Minutes in face to face in clinical consultation, greater than 50% of which was counseling/coordinating care for diagnostic angiography

## 2016-04-06 NOTE — Sedation Documentation (Signed)
5 Fr. Exoseal to right groin 

## 2016-04-06 NOTE — Procedures (Signed)
S/P 4 vessel cerebral arteriogram RT CFA approach. Findings. 1.Approx 5.3 mm x 4.35mm Lt ICA  Supraclinoid intracranial  Aneurysm. 2.Approx 80 to 85 % stenosis of Lt ICA prox supraclinoid seg. 3.Approx 60 % stenosis of LT VA origin

## 2016-04-08 ENCOUNTER — Encounter (HOSPITAL_COMMUNITY): Payer: Self-pay | Admitting: Interventional Radiology

## 2016-04-12 ENCOUNTER — Encounter: Payer: Medicare Other | Admitting: Internal Medicine

## 2016-04-13 ENCOUNTER — Other Ambulatory Visit (HOSPITAL_COMMUNITY): Payer: Self-pay | Admitting: Interventional Radiology

## 2016-04-13 DIAGNOSIS — I671 Cerebral aneurysm, nonruptured: Secondary | ICD-10-CM

## 2016-04-13 DIAGNOSIS — I771 Stricture of artery: Secondary | ICD-10-CM

## 2016-04-20 ENCOUNTER — Other Ambulatory Visit: Payer: Self-pay | Admitting: Radiology

## 2016-04-21 ENCOUNTER — Other Ambulatory Visit: Payer: Self-pay | Admitting: General Surgery

## 2016-04-21 ENCOUNTER — Telehealth: Payer: Self-pay | Admitting: General Surgery

## 2016-04-21 NOTE — Progress Notes (Signed)
The patient's daughter called to discuss some symptoms the patient was having.  He recently underwent an angiogram by Dr. Estanislado Pandy and was started on ASA and Plavix.  She called today to say that he is having some dizziness and nausea.  He denies a headaches or any pain.  He has a history of a benign positional vertigo.  The daughter asked him on the phone if his symptoms were related to his vertigo and he stated yes they were.  I told the daughter that these were not symptoms of his ASA or Plavix.  I advised the daughter that if his symptoms persist, he needs to call his PCP to be evaluated.  She was grateful and understood the plan.  Jillien Yakel E 3:42 PM 04/21/2016

## 2016-04-23 ENCOUNTER — Telehealth (HOSPITAL_COMMUNITY): Payer: Self-pay | Admitting: Interventional Radiology

## 2016-04-23 ENCOUNTER — Other Ambulatory Visit (HOSPITAL_COMMUNITY)
Admission: RE | Admit: 2016-04-23 | Discharge: 2016-04-23 | Disposition: A | Discharge status: Home / Self Care | Payer: Medicare Other | Source: Ambulatory Visit | Attending: Interventional Radiology | Admitting: Interventional Radiology

## 2016-04-23 ENCOUNTER — Encounter (HOSPITAL_COMMUNITY): Payer: Self-pay | Admitting: Vascular Surgery

## 2016-04-23 ENCOUNTER — Encounter (HOSPITAL_COMMUNITY): Payer: Self-pay | Admitting: *Deleted

## 2016-04-23 ENCOUNTER — Other Ambulatory Visit: Payer: Self-pay | Admitting: Radiology

## 2016-04-23 ENCOUNTER — Telehealth: Payer: Self-pay | Admitting: *Deleted

## 2016-04-23 DIAGNOSIS — Z029 Encounter for administrative examinations, unspecified: Secondary | ICD-10-CM | POA: Insufficient documentation

## 2016-04-23 LAB — PLATELET INHIBITION P2Y12: Platelet Function  P2Y12: 191 [PRU] — ABNORMAL LOW (ref 194–418)

## 2016-04-23 NOTE — Telephone Encounter (Signed)
Called pt's daughter, told her that her dad's procedure that is scheduled for Monday 04/26/16 is now canceled. Pt's P2Y12 is too high. Deveshwar changed his medications from Plavix 75mg  to Brilinta 90mg  1 b.i.d. And change change Aspirin to 81mg  daily. He will recheck a P2Y12 on 04/30/16. JM Medication called in to [facility] 04/23/16. Daughter agrees with this plan.

## 2016-04-23 NOTE — Telephone Encounter (Signed)
Received t/c from Saverio Danker with Interventional Radiology needing a clearance for patient to have cerebral angiogram with stenting on Monday 8/14.  Dr Marlou Porch reviewed in Dr Thompson Caul absence and gave the OK to proceed and to hold Eliquis tonight and over the weekend.  Attempted to page Claiborne Billings however have not received a return call at this time.  According to [facility] documentation the procedure has been cancelled for Monday.  Will forward this information to her for future reference.  She may since want clearance from Dr Tamala Julian as well.

## 2016-04-23 NOTE — Progress Notes (Addendum)
Anesthesia Chart Review: SAME DAY WORK-UP.  Patient is a 82 year old male scheduled for embolization of aneurysm on 04/26/16 by Dr. Estanislado Pandy. He has an approximately 5.3 mm X 4.8 mm saccular aneurysm arising from the posterior wall of the ICA just at the origin of tohe left PCA, 80-85% stenosis of the left ICA supraclinoid segment just proximal to the neck of the aneurysm, and approximately 50-60% stenosis at the origin of the left vertebral artery, and approximately 50% stenoses just distal to the origin of the left posterior-inferior cerebellar artery. These were incidental findings during a recent admission for BPPV exacerbation with hearing loss. Seen by neurologist Dr. Nicole Kindred with meclizine and PT for vestibular training recommended.Marland Kitchen  History includes former smoker, CAD s/p CABG '97, SSS/syncope s/p [employer] dual chamber PPM 08/27/11, afib/aflutter, mild AS by 2014 echo, AAA (4.2 cm AAA, 4.0 cm ascending thoracic aorta 04/30/14 CT), skin cancer (BCC), HTN, interstitial lung disease (by 07/2014 CT), GERD, HLD, PUD, remote history TB, BPH, BPPV.   - PCP is listed as Dr. Doylene Bode visit with Dr. Cathlean Cower on 03/17/16.  - Cardiologist is Dr. Daneen Schick, last visit 03/12/15. Patient was doing well then and one year follow-up recommended. Next visit scheduled for 06/09/16. - EP cardiologist is Dr.  Cristopher Peru, last visit 11/05/15. Patient had normal [place] DDD PM function. He was maintaining SR and denied any anginal symptoms.  - Pulmonologist is Dr. Christinia Gully, last visit 03/11/15.  Meds include Eliquis (on hold since 04/22/16), aspirin 325 mg, Plavix, Cartia XT, Nitro, Ranexa, Zocor, Flomax, valsartan, Pred Forte ophthalmic.   03/10/16 EKG: SR, first degree AV block.   03/14/14 Nuclear stress test: Overall Impression:  Low risk stress nuclear study with no areas of significant ischemia identified. LV Ejection Fraction: 61%.  LV Wall Motion:  There is septal wall hypokinesis consistent  with post bypass changes.  11/10/12 Echo: Mild aortic valve stenosis, trace aortic valve regurgitation (peak 2.21 m/s, peak grad 20 mmHg, mean 1.39 m/s, mean grad 10 mmHg, AVA vel 1.39 cm2, AVA vti 1.56 cm2). Mild mitral regurgitation. Mild left atrial enlargement. LVEF 60-65%. Pacer wire noted in the right ventricle. Trivial tricuspid regurgitation. Doppler findings suggest grade 1 diastolic dysfunction without elevated left atrial pressure. Trace pulmonic valve regurgitation.  04/12/08 Cardiac cath: CONCLUSIONS:  1. Patent saphenous vein grafts and internal mammary artery graft.  2. Severe native vessel coronary disease with total occlusion of LAD,      obtuse marginal distal circumflex, and mid right coronary.  3. Small distal abdominal aortic aneurysm with 50% left iliac stenosis      noted.  4. Low normal LV function with inferior wall motion abnormalities.  PLAN:  Intensified medical regimen.  No need for intervention.  Aortic  abdominal aneurysm needs to be followed sequentially. ADDENDUM  This addendum will be description of coronary bypass grafts.  A.  saphenous vein graft to the first diagonal:  This graft contains  segmental 30-50% narrowing starting at the ostium and extending into the  proximal one-third of the bypass graft.  The graft is patent otherwise  and supplies a moderate-sized although diffusely diseased first  diagonal.  B.  Saphenous vein graft to the circumflex/obtuse marginal:  This graft  is widely patent with distal 30-40% narrowing in the saphenous vein  graft before its termination on the obtuse marginal.  C.  Saphenous vein graft to the right coronary:  This graft is widely  patent with proximal luminal irregularities noted.  The right coronary  distal bed is small and diffusely diseased.  CONCLUSION:  Widely patent saphenous vein grafts.  PLAN:  As per the initial catheterization report performed on April 12, 2008.  03/10/16 CTA head/neck: IMPRESSION: 1.  Moderate to high-grade stenosis of the left internal carotid artery at the ophthalmic segment. 2. 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 3. Hypoplastic or occluded left A1 segment. 4. Atherosclerotic calcifications at the carotid bifurcations bilaterally without significant stenosis. 5. Moderate stenosis at the origin of the non dominant left vertebral artery. 6. Atherosclerotic changes at the origins the great vessels without stenosis. 7. Multilevel spondylosis of the cervical spine with severe left foraminal stenosis at C3-4.  03/10/16 CXR: IMPRESSION: New tiny nodule in the right upper lobe (superimposed on anterior first rib). CT chest without contrast recommended to further Evaluate. Otherwise unremarkable. (Report routed to Dr. Quay Burow, since it was unclear to me who was following.)  - 08/02/2014  Walked RA x 3 laps @ 185 ft each stopped due to  End of study, nl pace , no sob, no desat  - PFTs 08/16/14 FEV1  VC  2.61 (91%) s obst and DLCO 57% corrects to 92  - no obst - PFTs 03/11/2015        VC 2.60 (91%) s obst and dlco     63% corrects to 95%   He is for labs on the day of surgery. p2y12 today was 191.   Discussed above with anesthesiologist Dr. Al Corpus. With known CAD, afib, PMM, mild AS, AAA recommend cardiology clearance for procedure, if not done so already. Discussed with Saverio Danker, PA-C with IR. She will discuss with Dr. Estanislado Pandy.   George Hugh [facility]/Anesthesiology Phone [phone] 04/23/2016 2:35 PM

## 2016-04-25 ENCOUNTER — Telehealth: Payer: Self-pay | Admitting: Internal Medicine

## 2016-04-25 ENCOUNTER — Encounter: Payer: Self-pay | Admitting: Internal Medicine

## 2016-04-25 DIAGNOSIS — R911 Solitary pulmonary nodule: Secondary | ICD-10-CM | POA: Insufficient documentation

## 2016-04-25 DIAGNOSIS — I714 Abdominal aortic aneurysm, without rupture, unspecified: Secondary | ICD-10-CM | POA: Insufficient documentation

## 2016-04-25 NOTE — Telephone Encounter (Signed)
Please call him - he has an appt with me in December but he should follow up sooner.  We need to discuss getting a ct scan of his lung to follow up his chest xray from June and a ct scan to follow up his aortic aneurysm.

## 2016-04-26 ENCOUNTER — Telehealth: Payer: Self-pay | Admitting: Internal Medicine

## 2016-04-26 ENCOUNTER — Encounter (HOSPITAL_COMMUNITY): Admission: RE | Payer: Self-pay | Source: Ambulatory Visit

## 2016-04-26 ENCOUNTER — Encounter (HOSPITAL_COMMUNITY): Payer: Self-pay

## 2016-04-26 ENCOUNTER — Ambulatory Visit (HOSPITAL_COMMUNITY)
Admission: RE | Admit: 2016-04-26 | Payer: Medicare Other | Source: Ambulatory Visit | Admitting: Interventional Radiology

## 2016-04-26 ENCOUNTER — Ambulatory Visit (HOSPITAL_COMMUNITY): Admission: RE | Admit: 2016-04-26 | Payer: Medicare Other | Source: Ambulatory Visit

## 2016-04-26 HISTORY — DX: Nonrheumatic aortic (valve) stenosis: I35.0

## 2016-04-26 SURGERY — RADIOLOGY WITH ANESTHESIA
Anesthesia: General

## 2016-04-26 NOTE — Telephone Encounter (Signed)
Sheila,daughter, stated she just spoke with Dr. Quay Burow about Mr. Hjort CT result. She want to see if Dr. Quay Burow is aware that Dr. Estanislado Pandy is doing to procedure for embolization they just waiting for the blood result to come back.   Does Dr. Quay Burow think they should precess with this procedure? Please call her

## 2016-04-26 NOTE — Telephone Encounter (Signed)
The patient yes, I think he needs to have the procedure done. There are risks with the procedure, but he is also at risk for having a stroke if he does not have the procedure done.

## 2016-04-26 NOTE — Telephone Encounter (Signed)
Next week

## 2016-04-26 NOTE — Telephone Encounter (Signed)
You are booked this week, would you like me to work him in or wait until next week?

## 2016-04-26 NOTE — Telephone Encounter (Signed)
Spoke with pts daughter, pt is scheduled.

## 2016-04-26 NOTE — Telephone Encounter (Signed)
Please advise 

## 2016-04-27 NOTE — Telephone Encounter (Signed)
LVM with daughter with MDs response.  

## 2016-04-28 NOTE — Telephone Encounter (Signed)
Spoke with pts daughter to clarify.

## 2016-04-28 NOTE — Telephone Encounter (Signed)
Yes he should come in.

## 2016-04-28 NOTE — Telephone Encounter (Signed)
Please advise 

## 2016-04-28 NOTE — Telephone Encounter (Signed)
Freda Munro called back in to advise that the patient is going to follow through with dr deveshwar for the procedure. She wants to make sure that you want to keep the 05/05/2016 appt to talk about results.

## 2016-04-30 ENCOUNTER — Other Ambulatory Visit: Payer: Self-pay | Admitting: Physician Assistant

## 2016-04-30 ENCOUNTER — Other Ambulatory Visit (HOSPITAL_COMMUNITY): Admit: 2016-04-30 | Payer: Medicare Other

## 2016-04-30 ENCOUNTER — Telehealth (HOSPITAL_COMMUNITY): Payer: Self-pay | Admitting: Radiology

## 2016-04-30 DIAGNOSIS — I6529 Occlusion and stenosis of unspecified carotid artery: Secondary | ICD-10-CM

## 2016-04-30 LAB — PLATELET INHIBITION P2Y12: PLATELET FUNCTION P2Y12: 9 [PRU] — AB (ref 194–418)

## 2016-04-30 NOTE — Telephone Encounter (Signed)
Pt's daughter Freda Munro) called today to see how her dad's P2Y12 test came out. Per Dr. Estanislado Pandy the patient is to continue on Aspirin 81mg  1 daily and change Brilinta to 90mg  in the morning and 45mg  in the evening. The daughter was given this information and expressed understanding. They will have a recheck P2Y12 next Friday, May 07, 2016. JM

## 2016-05-03 ENCOUNTER — Other Ambulatory Visit: Payer: Self-pay | Admitting: *Deleted

## 2016-05-03 MED ORDER — SIMVASTATIN 20 MG PO TABS: 20.0000 mg | ORAL_TABLET | Freq: Every evening | ORAL | 2 refills | Status: DC

## 2016-05-03 NOTE — Telephone Encounter (Signed)
Daughter left msg stating pt needing new rx sent to [facility] on his simvastatin. Called daughter back inform will send to mail; order pharmacy. [facility].Marland KitchenDebroah Baller

## 2016-05-05 ENCOUNTER — Ambulatory Visit (INDEPENDENT_AMBULATORY_CARE_PROVIDER_SITE_OTHER): Payer: Medicare Other | Admitting: Internal Medicine

## 2016-05-05 ENCOUNTER — Encounter: Payer: Self-pay | Admitting: Internal Medicine

## 2016-05-05 VITALS — BP 116/70 | HR 62 | Temp 97.8°F | Resp 18 | Ht 64.0 in | Wt 135.0 lb

## 2016-05-05 DIAGNOSIS — H8110 Benign paroxysmal vertigo, unspecified ear: Secondary | ICD-10-CM

## 2016-05-05 DIAGNOSIS — I714 Abdominal aortic aneurysm, without rupture, unspecified: Secondary | ICD-10-CM

## 2016-05-05 DIAGNOSIS — R911 Solitary pulmonary nodule: Secondary | ICD-10-CM | POA: Diagnosis not present

## 2016-05-05 NOTE — Progress Notes (Signed)
Pre visit review using our clinic review tool, if applicable. No additional management support is needed unless otherwise documented below in the visit note. 

## 2016-05-05 NOTE — Progress Notes (Signed)
Subjective:    Patient ID: Shaun James, male    DOB: 02-14-1931, 82 y.o.   MRN: [id number]  HPI He is here for follow up.  He was in the hospital 6/28 for dizziness thought to be BPPV.  In the work up he was found to have a brain aneurysm and carotid disease.  He is following with IR for aneurysm.    In the ED on 6/28 he had a CXR done that showed a new tiny nodule in the RUL.  At CT scan of the chest was recommended, which he has not had.He denies any respiratory symptoms including cough, wheeze and shortness of breath. He denies chest pain.    AAA:His last Ct scan was 2015. He is not currently following with vascular surgery and has not had a CT scan done since then. He denies any abdominal pain or discomfort.  Dizziness/vertigo: He continues to have intermittent dizziness. The dizziness is worse with head movements. The imaging he had in the emergency room did not reveal a cause for dizziness and it was thought that he had benign paroxysmal positional vertigo. He is taking meclizine as needed and besides drowsiness as a side effect, he feels it does help. He was referred for physical therapy, but did not feel that he needed to. I do not think he understood why he was referred to physical therapy. He is having continued dizziness and wants to do something that will help. He agrees to do physical therapy.  He is exercising daily-walking.     Medications and allergies reviewed with patient and updated if appropriate.  Patient Active Problem List   Diagnosis Date Noted  . AAA (abdominal aortic aneurysm) without rupture ([place]) 04/25/2016  . Pulmonary nodule 04/25/2016  . Bilateral hearing loss 03/22/2016  . Aneurysm, cerebral, nonruptured 03/11/2016  . Carotid artery stenosis 03/11/2016  . Vertigo 03/10/2016  . BPPV (benign paroxysmal positional vertigo) 03/10/2016  . Ataxia   . Right elbow pain 01/28/2016  . Rhinorrhea 08/12/2015  . Burning tongue 08/12/2015  . GERD  (gastroesophageal reflux disease) 08/12/2015  . BPH without urinary obstruction 04/10/2015  . 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 Mucopurulent chronic bronchitis ([place]) 07/17/2014  . Insomnia 05/22/2014  . Chronic headache   . Osteoarthritis   . Pacemaker 10/18/2011  . Atrial flutter / Atrial Fibrillation 09/13/2011  . CAD (coronary artery disease), autologous vein bypass graft 09/13/2011  . Sick sinus syndrome ([place]) 09/13/2011  . Hyperlipidemia with target LDL less than 70 04/16/2008  . Acute myocardial infarction ([place]) 04/16/2008  . Pulmonary fibrosis ([place]) 04/16/2008    Current Outpatient Prescriptions on File Prior to Visit  Medication Sig Dispense Refill  . acetaminophen (TYLENOL) 500 MG tablet Take 1 tablet (500 mg total) by mouth daily as needed for headache. 14 tablet 0  . apixaban (ELIQUIS) 2.5 MG TABS tablet Take 1 tablet (2.5 mg total) by mouth 2 (two) times daily. 180 tablet 3  . aspirin 325 MG tablet Take 325 mg by mouth daily.    . clopidogrel (PLAVIX) 75 MG tablet Take 75 mg by mouth daily.    Marland Kitchen diltiazem (CARTIA XT) 240 MG 24 hr capsule Take 240 mg by mouth daily.    . meclizine (ANTIVERT) 25 MG tablet Take 1 tablet (25 mg total) by mouth 3 (three) times daily as needed for dizziness. 50 tablet 1  . nitroGLYCERIN (NITROSTAT) 0.4 MG SL tablet PLACE 1 TABLET UNDER THE TONGUE EVERY 5 MINUTES AS NEEDED  FOR CHEST PAIN 25 tablet 5  . OVER THE COUNTER MEDICATION Take 1 tablet by mouth daily. Memory vitamin    . prednisoLONE acetate (PRED FORTE) 1 % ophthalmic suspension Place 1 drop into both eyes 4 (four) times daily.  0  . RANEXA 500 MG 12 hr tablet TAKE 1 TABLET TWICE A DAY 180 tablet 0  . simvastatin (ZOCOR) 20 MG tablet Take 1 tablet (20 mg total) by mouth every evening. 90 tablet 2  . tamsulosin (FLOMAX) 0.4 MG CAPS capsule Take 0.4 mg by mouth daily.    . valsartan (DIOVAN) 320 MG tablet TAKE ONE-HALF (1/2) TABLET DAILY 45 tablet 2   No current  facility-administered medications on file prior to visit.     Past Medical History:  Diagnosis Date  . AAA (abdominal aortic aneurysm) (HCC)    3.1 cm AAA, reevaluated 08/2009 per ultrasound - stable  . Allergic rhinitis   . Aortic sclerosis ([place])    Probable AS on physical exam, 2010  . Aortic stenosis    mild AS 2014 echo  . Arthritis   . Atrial fibrillation (HCC)    chronic anticoag  . Atrial flutter ([place])   . Basal cell carcinoma   . Benign positional vertigo   . BPH (benign prostatic hyperplasia)   . CAD in native artery    a) s/p CABG '98; LIMA-LAD, SVG-D1, SVG-OM1, SVG-PDA); b) CATH -2009: midLAD & RCA 100%, OM2 100% wtih severe native Cx; Grafts patent with ~30-50% SVG-D1 & ~30-40% SVG-OM, EF 45%; c) Lexiscan Cardiolite 02/2014: EF 61%, No Ischemia; subtle fixed anteroseptal defect.  . Cardiac pacemaker in [place]  . Coronary atherosclerosis of native coronary artery   . Diverticulosis of colon (without mention of hemorrhage)   . DJD (degenerative joint disease)   . Esophageal reflux   . Essential hypertension, benign   . GERD (gastroesophageal reflux disease)   . History of tuberculosis    remote Hx  . Inflamed seborrheic keratosis   . Intermittent confusion   . Internal hemorrhoids    severe  . Mixed hyperlipidemia   . Mouth burn   . Myocardial infarction ([place])   . Near syncope    uncertain cause. R/O arrhythmia, R/O med effect  . Osteoarthritis 06/14/2012  . Peptic ulcer, unspecified site, unspecified as acute or chronic, without mention of hemorrhage, perforation, or obstruction   . Personal history of colonic polyps   . Presbycusis of both ears    Bilateral hearing aids  . Prostatitis dx 12/2102   E coli Ucx  . SSS (sick sinus syndrome) (HCC)    syncope, s/p ppm 12/12  . Systolic murmur    Worrisome for AS. AS could cause exertional fatigue.    Past Surgical History:  Procedure Laterality Date  . CARDIAC CATHETERIZATION  2002  .  cataracts     bilateral  . CORONARY ARTERY BYPASS GRAFT  1997   (LIMA to LAD, SVG to diagonal-50% closed on catheterization in 1999, SVG to OM1, SVG to PDA) Repeat cath 2009 with patent grafts  . EXCISION OF TONGUE LESION    . HEMORRHOID SURGERY  07/31/2010  . INSERT / REPLACE / REMOVE PACEMAKER  08/27/2011   PPM implant  . IR GENERIC HISTORICAL  04/06/2016   IR ANGIO VERTEBRAL SEL SUBCLAVIAN INNOMINATE UNI L MOD SED 04/06/2016 Luanne Bras, MD [facility]INTERV RAD  . IR GENERIC HISTORICAL  04/06/2016   IR ANGIO VERTEBRAL SEL VERTEBRAL UNI R MOD SED 04/06/2016  Luanne Bras, MD [facility]  . IR GENERIC HISTORICAL  04/06/2016   IR ANGIO INTRA EXTRACRAN SEL COM CAROTID INNOMINATE BILAT MOD SED 04/06/2016 Luanne Bras, MD [facility]INTER[facility]  . left rotator cuff surgery  2000  . PERMANENT PACEMAKER INSERTION N/A 08/27/2011   Procedure: PERMANENT PACEMAKER INSERTION;  Surgeon: Evans Lance, MD;  Location: [facility] CATH LAB;  Service: Cardiovascular;  Laterality: N/A;  . PUNCH BIOPSY OF SKIN  08/2009   5 mm punch biopsy on upper mid back melanotic appearing lesion    Social History   Social History  . Marital status: Married    Spouse name: N/A  . Number of children: 4  . Years of education: N/A   Occupational History  . retired     retired Art gallery manager   Social History Main Topics  . Smoking status: Former Smoker    Packs/day: 0.50    Years: 15.00    Types: Cigarettes    Quit date: 09/13/1965  . Smokeless tobacco: Never Used  . Alcohol use No  . Drug use: No  . Sexual activity: Not Currently   Other Topics Concern  . Not on file   Social History Narrative   Moved from [place] in [place] alone; lives in 2 story home but stays downstairs   3 children / 1 other;     Family History  Problem Relation Age of Onset  . Other Father 33    [place]  . Heart attack Mother 16  . Heart disease Mother     ASHD  . COPD Brother   . Hypertension Brother   . Heart disease  Brother     ASHD  . Heart disease Brother     ASHD  . Heart disease Sister     ASHD  . COPD Brother   . Coronary artery disease      Review of Systems  Constitutional: Negative for fever.  Respiratory: Negative for cough, shortness of breath and wheezing.   Cardiovascular: Positive for palpitations (occasional). Negative for chest pain and leg swelling.  Gastrointestinal: Negative for abdominal pain.       Occ GERD  Neurological: Positive for dizziness and headaches.       Objective:   Vitals:   05/05/16 0938  BP: 116/70  Pulse: 62  Resp: 18  Temp: 97.8 F (36.6 C)   Filed Weights   05/05/16 0938  Weight: 135 lb (61.2 kg)   Body mass index is 23.17 kg/m.   Physical Exam Constitutional: Appears well-developed and well-nourished. No distress.  HENT:  Head: Normocephalic and atraumatic.  Neck: Neck supple. No tracheal deviation present. No thyromegaly present. No cervical adenopathy.  Cardiovascular: Normal rate, regular rhythm and normal heart sounds.   2/6 systolic murmur heard. No carotid bruit  Pulmonary/Chest: Effort normal and breath sounds normal. No respiratory distress. No has no wheezes. No rales.   abdomen: Soft, nontender, nondistended  Musculoskeletal: No edema.  Neurological: CN II-XII intact, non-focal Skin: Skin is warm and dry. Not diaphoretic.  Psychiatric: Normal mood and affect. Behavior is normal.         Assessment & Plan:   See Problem List for Assessment and Plan of chronic medical problems.

## 2016-05-05 NOTE — Patient Instructions (Signed)
Dizziness:  Continue to take the meclizine as needed.  Physical therapy will help with the dizziness. I have ordered physical therapy someone will call you to schedule.  Lung nodule: This was seen on your chest x-ray in July. I have ordered a CAT scan to evaluate this further. Someone will call you to schedule this.  Abdominal aortic aneurysm: Your last CAT scan was in 2015 and we'll need to recheck this to make sure it has not grown. I used CAT scan of your abdomen and someone will call you to schedule this.

## 2016-05-05 NOTE — Assessment & Plan Note (Signed)
Still experiencing intermittent dizziness, worse with head movements Imaging done recently negative Continue meclizine as needed Stressed physical therapy-referred again today

## 2016-05-05 NOTE — Assessment & Plan Note (Signed)
Due for repeat imaging CT scan of the abdomen with contrast ordered May need to see vascular surgery

## 2016-05-05 NOTE — Assessment & Plan Note (Signed)
CT of the chest without contrast ordered.

## 2016-05-06 ENCOUNTER — Other Ambulatory Visit: Payer: Self-pay | Admitting: Radiology

## 2016-05-06 LAB — PLATELET INHIBITION P2Y12: PLATELET FUNCTION P2Y12: 4 [PRU] — AB (ref 194–418)

## 2016-05-10 ENCOUNTER — Telehealth: Payer: Self-pay | Admitting: Emergency Medicine

## 2016-05-10 DIAGNOSIS — R911 Solitary pulmonary nodule: Secondary | ICD-10-CM

## 2016-05-10 NOTE — Telephone Encounter (Signed)
Spoke with pts daughter. Referral was entered correctly. Advised her to call [facility]

## 2016-05-10 NOTE — Telephone Encounter (Signed)
Received call from CT to change Chest CT to w contrast for better imaging. CT ordered

## 2016-05-10 NOTE — Telephone Encounter (Signed)
Patients daughter called and states some paperwork for vertigo therapy was suppose to be sent to [facility]. They have not received anything. Can you try to refax it please. Fax #: [phone]. Thanks.

## 2016-05-12 ENCOUNTER — Other Ambulatory Visit (HOSPITAL_COMMUNITY)
Admission: RE | Admit: 2016-05-12 | Discharge: 2016-05-12 | Disposition: A | Discharge status: Home / Self Care | Payer: Medicare Other | Source: Ambulatory Visit | Attending: Interventional Radiology | Admitting: Interventional Radiology

## 2016-05-12 ENCOUNTER — Other Ambulatory Visit: Payer: Self-pay | Admitting: Radiology

## 2016-05-12 LAB — PLATELET INHIBITION P2Y12: Platelet Function  P2Y12: 20 [PRU] — ABNORMAL LOW (ref 194–418)

## 2016-05-14 ENCOUNTER — Ambulatory Visit (INDEPENDENT_AMBULATORY_CARE_PROVIDER_SITE_OTHER)
Admission: RE | Admit: 2016-05-14 | Discharge: 2016-05-14 | Disposition: A | Discharge status: Home / Self Care | Payer: Medicare Other | Source: Ambulatory Visit | Attending: Internal Medicine | Admitting: Internal Medicine

## 2016-05-14 ENCOUNTER — Telehealth (HOSPITAL_COMMUNITY): Payer: Self-pay | Admitting: Radiology

## 2016-05-14 DIAGNOSIS — R911 Solitary pulmonary nodule: Secondary | ICD-10-CM

## 2016-05-14 DIAGNOSIS — I714 Abdominal aortic aneurysm, without rupture, unspecified: Secondary | ICD-10-CM

## 2016-05-14 MED ORDER — IOPAMIDOL (ISOVUE-300) INJECTION 61%
100.0000 mL | Freq: Once | INTRAVENOUS | Status: AC | PRN
  Administered 2016-05-14: 100 mL via INTRAVENOUS

## 2016-05-14 NOTE — Telephone Encounter (Signed)
Spoke to pt's daughter. Pt had P2Y12 done on 05/12/16. Per Deveshwar the pt was advised to continue as is with Brilinta 45mg  b.i.d. And Aspirin 81mg  daily. He will recheck this on 05/18/16. Pt's daughter expresses understanding and is in agreement w/ this plan of care. JM

## 2016-05-16 ENCOUNTER — Other Ambulatory Visit: Payer: Self-pay | Admitting: Internal Medicine

## 2016-05-16 ENCOUNTER — Encounter: Payer: Self-pay | Admitting: Internal Medicine

## 2016-05-18 ENCOUNTER — Telehealth (HOSPITAL_COMMUNITY): Payer: Self-pay | Admitting: Radiology

## 2016-05-18 ENCOUNTER — Other Ambulatory Visit: Payer: Self-pay | Admitting: Radiology

## 2016-05-18 ENCOUNTER — Ambulatory Visit: Payer: Medicare Other | Attending: Internal Medicine

## 2016-05-18 ENCOUNTER — Encounter (HOSPITAL_COMMUNITY): Payer: Self-pay | Admitting: *Deleted

## 2016-05-18 DIAGNOSIS — R42 Dizziness and giddiness: Secondary | ICD-10-CM | POA: Insufficient documentation

## 2016-05-18 LAB — PLATELET INHIBITION P2Y12: Platelet Function  P2Y12: 1 [PRU] — ABNORMAL LOW (ref 194–418)

## 2016-05-18 NOTE — Patient Instructions (Signed)
Please have your surgeon write a note clearing you for vestibular physical therapy, once you are discharged home from hospital.  Once you have this note, schedule PT evaluation and bring this note with you or have MD send it to our office vis [facility] (EMR system).  Geoffry Paradise, PT,DPT 05/18/16 8:13 AM Phone: [phone] Fax: [phone]

## 2016-05-18 NOTE — Progress Notes (Signed)
Spoke with pt's daughter Freda Munro for pre-op call. Pt is extremely hard of hearing. Pt has cardiac history with hx of CABG. She states pt has not c/o any recent chest pain or sob. Pt has a pacemaker, he sees Dr. Cristopher Peru and Dr. Daneen Schick. Freda Munro states that Emerson from Dr. Arlean Hopping office called this AM to have pt not take his Brilinta 45mg  tonight or in the AM (05/19/16). She states she wasn't instructed about taking Aspirin in the AM. I told her that I would call Anderson Malta and verify whether pt is to take it or not in the AM and will call her back when I find out. She voiced understanding.

## 2016-05-18 NOTE — Telephone Encounter (Signed)
Called Freda Munro, pt's daughter, told her per Estanislado Pandy pt is to hold his Brilinita 45mg  this evening and tomorrow (05/19/16) morning prior to his procedure. He will come in as scheduled tomorrow and have a P2Y12 checked first before anything else is done. Daughter and pt agree! JM

## 2016-05-18 NOTE — Progress Notes (Signed)
Spoke with Anderson Malta in Dr. Arlean Hopping office and she states pt is to take Aspirin in the AM. I called and spoke with his daughter, Freda Munro and told her to have him take Aspirin in the AM. She voiced understanding.
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
## 2016-05-19 ENCOUNTER — Encounter (HOSPITAL_COMMUNITY): Payer: Self-pay

## 2016-05-19 ENCOUNTER — Encounter (HOSPITAL_COMMUNITY): Payer: Self-pay | Admitting: *Deleted

## 2016-05-19 ENCOUNTER — Observation Stay (HOSPITAL_BASED_OUTPATIENT_CLINIC_OR_DEPARTMENT_OTHER): Payer: Medicare Other

## 2016-05-19 ENCOUNTER — Encounter (HOSPITAL_COMMUNITY): Payer: Self-pay | Admitting: Radiology

## 2016-05-19 ENCOUNTER — Inpatient Hospital Stay (HOSPITAL_COMMUNITY)
Admission: AD | Admit: 2016-05-19 | Discharge: 2016-05-20 | DRG: 026 | Disposition: A | Discharge status: Home / Self Care | Payer: Medicare Other | Source: Ambulatory Visit | Attending: Interventional Radiology | Admitting: Interventional Radiology

## 2016-05-19 ENCOUNTER — Observation Stay (HOSPITAL_COMMUNITY): Payer: Medicare Other

## 2016-05-19 ENCOUNTER — Ambulatory Visit (HOSPITAL_COMMUNITY): Payer: Medicare Other | Admitting: Certified Registered Nurse Anesthetist

## 2016-05-19 ENCOUNTER — Ambulatory Visit (HOSPITAL_COMMUNITY)
Admission: RE | Admit: 2016-05-19 | Discharge: 2016-05-19 | Disposition: A | Discharge status: Home / Self Care | Payer: Medicare Other | Source: Ambulatory Visit | Attending: Interventional Radiology | Admitting: Interventional Radiology

## 2016-05-19 ENCOUNTER — Encounter (HOSPITAL_COMMUNITY)
Admission: AD | Disposition: A | Discharge status: Home / Self Care | Payer: Self-pay | Source: Ambulatory Visit | Attending: Interventional Radiology

## 2016-05-19 DIAGNOSIS — Z87891 Personal history of nicotine dependence: Secondary | ICD-10-CM

## 2016-05-19 DIAGNOSIS — Z8249 Family history of ischemic heart disease and other diseases of the circulatory system: Secondary | ICD-10-CM

## 2016-05-19 DIAGNOSIS — M7989 Other specified soft tissue disorders: Secondary | ICD-10-CM

## 2016-05-19 DIAGNOSIS — Z85828 Personal history of other malignant neoplasm of skin: Secondary | ICD-10-CM | POA: Diagnosis not present

## 2016-05-19 DIAGNOSIS — I10 Essential (primary) hypertension: Secondary | ICD-10-CM | POA: Diagnosis present

## 2016-05-19 DIAGNOSIS — E782 Mixed hyperlipidemia: Secondary | ICD-10-CM | POA: Diagnosis present

## 2016-05-19 DIAGNOSIS — I2510 Atherosclerotic heart disease of native coronary artery without angina pectoris: Secondary | ICD-10-CM | POA: Diagnosis present

## 2016-05-19 DIAGNOSIS — I350 Nonrheumatic aortic (valve) stenosis: Secondary | ICD-10-CM | POA: Diagnosis present

## 2016-05-19 DIAGNOSIS — I671 Cerebral aneurysm, nonruptured: Principal | ICD-10-CM | POA: Diagnosis present

## 2016-05-19 DIAGNOSIS — I668 Occlusion and stenosis of other cerebral arteries: Secondary | ICD-10-CM | POA: Diagnosis present

## 2016-05-19 DIAGNOSIS — I4892 Unspecified atrial flutter: Secondary | ICD-10-CM | POA: Diagnosis present

## 2016-05-19 DIAGNOSIS — Z950 Presence of cardiac pacemaker: Secondary | ICD-10-CM

## 2016-05-19 DIAGNOSIS — Z951 Presence of aortocoronary bypass graft: Secondary | ICD-10-CM | POA: Diagnosis not present

## 2016-05-19 DIAGNOSIS — Z7902 Long term (current) use of antithrombotics/antiplatelets: Secondary | ICD-10-CM

## 2016-05-19 DIAGNOSIS — N400 Enlarged prostate without lower urinary tract symptoms: Secondary | ICD-10-CM | POA: Diagnosis present

## 2016-05-19 DIAGNOSIS — R609 Edema, unspecified: Secondary | ICD-10-CM

## 2016-05-19 DIAGNOSIS — I729 Aneurysm of unspecified site: Secondary | ICD-10-CM

## 2016-05-19 DIAGNOSIS — Z79899 Other long term (current) drug therapy: Secondary | ICD-10-CM

## 2016-05-19 DIAGNOSIS — Z7901 Long term (current) use of anticoagulants: Secondary | ICD-10-CM

## 2016-05-19 DIAGNOSIS — Z7982 Long term (current) use of aspirin: Secondary | ICD-10-CM | POA: Diagnosis not present

## 2016-05-19 DIAGNOSIS — I252 Old myocardial infarction: Secondary | ICD-10-CM

## 2016-05-19 DIAGNOSIS — I714 Abdominal aortic aneurysm, without rupture: Secondary | ICD-10-CM | POA: Diagnosis present

## 2016-05-19 DIAGNOSIS — I480 Paroxysmal atrial fibrillation: Secondary | ICD-10-CM | POA: Diagnosis present

## 2016-05-19 DIAGNOSIS — Z8611 Personal history of tuberculosis: Secondary | ICD-10-CM

## 2016-05-19 DIAGNOSIS — R42 Dizziness and giddiness: Secondary | ICD-10-CM | POA: Diagnosis present

## 2016-05-19 DIAGNOSIS — I771 Stricture of artery: Secondary | ICD-10-CM

## 2016-05-19 HISTORY — DX: Presence of cardiac pacemaker: Z95.0

## 2016-05-19 HISTORY — PX: RADIOLOGY WITH ANESTHESIA: SHX6223

## 2016-05-19 HISTORY — DX: Headache: R51

## 2016-05-19 HISTORY — DX: Pneumonia, unspecified organism: J18.9

## 2016-05-19 HISTORY — DX: Headache, unspecified: R51.9

## 2016-05-19 HISTORY — PX: IR GENERIC HISTORICAL: IMG1180011

## 2016-05-19 LAB — PROTIME-INR
INR: 1.06
Prothrombin Time: 13.8 seconds (ref 11.4–15.2)

## 2016-05-19 LAB — COMPREHENSIVE METABOLIC PANEL
ALBUMIN: 3.9 g/dL (ref 3.5–5.0)
ALK PHOS: 54 U/L (ref 38–126)
ALT: 13 U/L — ABNORMAL LOW (ref 17–63)
AST: 19 U/L (ref 15–41)
Anion gap: 15 (ref 5–15)
BILIRUBIN TOTAL: 0.3 mg/dL (ref 0.3–1.2)
BUN: 13 mg/dL (ref 6–20)
CALCIUM: 8.7 mg/dL — AB (ref 8.9–10.3)
CO2: 20 mmol/L — ABNORMAL LOW (ref 22–32)
Chloride: 100 mmol/L — ABNORMAL LOW (ref 101–111)
Creatinine, Ser: 1.03 mg/dL (ref 0.61–1.24)
GFR calc Af Amer: >60 mL/min (ref >60)
GLUCOSE: 111 mg/dL — AB (ref 65–99)
Potassium: 3.8 mmol/L (ref 3.5–5.1)
Sodium: 135 mmol/L (ref 135–145)
TOTAL PROTEIN: 6.7 g/dL (ref 6.5–8.1)

## 2016-05-19 LAB — POCT ACTIVATED CLOTTING TIME
ACTIVATED CLOTTING TIME: 164 s
ACTIVATED CLOTTING TIME: 175 s
ACTIVATED CLOTTING TIME: 180 s

## 2016-05-19 LAB — CBC WITH DIFFERENTIAL/PLATELET
BASOS ABS: 0 10*3/uL (ref 0.0–0.1)
BASOS PCT: 0 %
Eosinophils Absolute: 1.1 10*3/uL — ABNORMAL HIGH (ref 0.0–0.7)
Eosinophils Relative: 13 %
HEMATOCRIT: 41 % (ref 39.0–52.0)
HEMOGLOBIN: 13.7 g/dL (ref 13.0–17.0)
LYMPHS PCT: 22 %
Lymphs Abs: 1.8 10*3/uL (ref 0.7–4.0)
MCH: 30.6 pg (ref 26.0–34.0)
MCHC: 33.4 g/dL (ref 30.0–36.0)
MCV: 91.7 fL (ref 78.0–100.0)
MONOS PCT: 11 %
Monocytes Absolute: 1 10*3/uL (ref 0.1–1.0)
NEUTROS ABS: 4.5 10*3/uL (ref 1.7–7.7)
NEUTROS PCT: 54 %
Platelets: 214 10*3/uL (ref 150–400)
RBC: 4.47 MIL/uL (ref 4.22–5.81)
RDW: 13 % (ref 11.5–15.5)
WBC: 8.5 10*3/uL (ref 4.0–10.5)

## 2016-05-19 LAB — MRSA PCR SCREENING: MRSA BY PCR: NEGATIVE

## 2016-05-19 LAB — APTT
APTT: 73 s — AB (ref 24–36)
aPTT: 30 seconds (ref 24–36)

## 2016-05-19 LAB — PLATELET INHIBITION P2Y12: PLATELET FUNCTION P2Y12: 61 [PRU] — AB (ref 194–418)

## 2016-05-19 SURGERY — RADIOLOGY WITH ANESTHESIA
Anesthesia: General

## 2016-05-19 MED ORDER — CHLORHEXIDINE GLUCONATE CLOTH 2 % EX PADS: 6.0000 | MEDICATED_PAD | Freq: Every day | CUTANEOUS | Status: DC

## 2016-05-19 MED ORDER — FENTANYL CITRATE (PF) 100 MCG/2ML IJ SOLN
INTRAMUSCULAR | Status: DC | PRN
  Administered 2016-05-19 (×4): 50 ug via INTRAVENOUS

## 2016-05-19 MED ORDER — ONDANSETRON HCL 4 MG/2ML IJ SOLN
INTRAMUSCULAR | Status: DC | PRN
  Administered 2016-05-19: 4 mg via INTRAVENOUS

## 2016-05-19 MED ORDER — LIDOCAINE 2% (20 MG/ML) 5 ML SYRINGE
INTRAMUSCULAR | Status: DC | PRN
  Administered 2016-05-19: 60 mg via INTRAVENOUS

## 2016-05-19 MED ORDER — NICARDIPINE HCL IN NACL 20-0.86 MG/200ML-% IV SOLN: 5.0000 mg/h | INTRAVENOUS | Status: DC

## 2016-05-19 MED ORDER — VANCOMYCIN HCL 1000 MG IV SOLR
1000.0000 mg | INTRAVENOUS | Status: AC
  Administered 2016-05-19: 1000 mg via INTRAVENOUS

## 2016-05-19 MED ORDER — NITROGLYCERIN 0.4 MG SL SUBL: 0.4000 mg | SUBLINGUAL_TABLET | SUBLINGUAL | Status: DC | PRN

## 2016-05-19 MED ORDER — PREDNISOLONE ACETATE 1 % OP SUSP
1.0000 [drp] | Freq: Four times a day (QID) | OPHTHALMIC | Status: DC | PRN
  Filled 2016-05-19: qty 1

## 2016-05-19 MED ORDER — PHENYLEPHRINE 40 MCG/ML (10ML) SYRINGE FOR IV PUSH (FOR BLOOD PRESSURE SUPPORT)
PREFILLED_SYRINGE | INTRAVENOUS | Status: DC | PRN
  Administered 2016-05-19 (×3): 80 ug via INTRAVENOUS

## 2016-05-19 MED ORDER — NITROGLYCERIN 1 MG/10 ML FOR IR/CATH LAB
INTRA_ARTERIAL | Status: AC
Start: 2016-05-19 — End: 2016-05-19
  Filled 2016-05-19: qty 10

## 2016-05-19 MED ORDER — EPHEDRINE SULFATE-NACL 50-0.9 MG/10ML-% IV SOSY
PREFILLED_SYRINGE | INTRAVENOUS | Status: DC | PRN
  Administered 2016-05-19: 10 mg via INTRAVENOUS
  Administered 2016-05-19: 5 mg via INTRAVENOUS
  Administered 2016-05-19 (×2): 10 mg via INTRAVENOUS
  Administered 2016-05-19 (×3): 5 mg via INTRAVENOUS

## 2016-05-19 MED ORDER — HEPARIN (PORCINE) IN NACL 100-0.45 UNIT/ML-% IJ SOLN
450.0000 [IU]/h | INTRAMUSCULAR | Status: DC
  Filled 2016-05-19: qty 250

## 2016-05-19 MED ORDER — ACETAMINOPHEN 500 MG PO TABS: 500.0000 mg | ORAL_TABLET | Freq: Every day | ORAL | Status: DC | PRN

## 2016-05-19 MED ORDER — HEPARIN (PORCINE) IN NACL 100-0.45 UNIT/ML-% IJ SOLN
500.0000 [IU]/h | INTRAMUSCULAR | Status: DC
  Administered 2016-05-19: 500 [IU]/h via INTRAVENOUS
  Filled 2016-05-19: qty 250

## 2016-05-19 MED ORDER — ONDANSETRON HCL 4 MG/2ML IJ SOLN: 4.0000 mg | Freq: Four times a day (QID) | INTRAMUSCULAR | Status: DC | PRN

## 2016-05-19 MED ORDER — MUPIROCIN 2 % EX OINT: 1.0000 "application " | TOPICAL_OINTMENT | Freq: Two times a day (BID) | CUTANEOUS | Status: DC

## 2016-05-19 MED ORDER — NIMODIPINE 30 MG PO CAPS: 0.0000 mg | ORAL_CAPSULE | ORAL | Status: DC

## 2016-05-19 MED ORDER — TICAGRELOR 90 MG PO TABS
45.0000 mg | ORAL_TABLET | Freq: Once | ORAL | Status: AC
  Administered 2016-05-19: 45 mg via ORAL
  Filled 2016-05-19: qty 1

## 2016-05-19 MED ORDER — TICAGRELOR 90 MG PO TABS
45.0000 mg | ORAL_TABLET | Freq: Two times a day (BID) | ORAL | Status: DC
  Administered 2016-05-19 – 2016-05-20 (×2): 45 mg via ORAL
  Filled 2016-05-19 (×2): qty 1

## 2016-05-19 MED ORDER — HEPARIN (PORCINE) IN NACL 100-0.45 UNIT/ML-% IJ SOLN
INTRAMUSCULAR | Status: AC
  Filled 2016-05-19: qty 250

## 2016-05-19 MED ORDER — IOPAMIDOL (ISOVUE-300) INJECTION 61%
INTRAVENOUS | Status: AC
  Administered 2016-05-19: 55 mL
  Filled 2016-05-19: qty 100

## 2016-05-19 MED ORDER — CLOPIDOGREL BISULFATE 75 MG PO TABS: 75.0000 mg | ORAL_TABLET | ORAL | Status: DC

## 2016-05-19 MED ORDER — PROPOFOL 10 MG/ML IV BOLUS
INTRAVENOUS | Status: DC | PRN
  Administered 2016-05-19: 120 mg via INTRAVENOUS
  Administered 2016-05-19: 40 mg via INTRAVENOUS

## 2016-05-19 MED ORDER — ASPIRIN EC 325 MG PO TBEC: 325.0000 mg | DELAYED_RELEASE_TABLET | ORAL | Status: DC

## 2016-05-19 MED ORDER — ASPIRIN 81 MG PO CHEW
CHEWABLE_TABLET | ORAL | Status: AC
  Administered 2016-05-19: 243 mg
  Filled 2016-05-19: qty 3

## 2016-05-19 MED ORDER — RANOLAZINE ER 500 MG PO TB12
500.0000 mg | ORAL_TABLET | Freq: Two times a day (BID) | ORAL | Status: DC
  Administered 2016-05-19 – 2016-05-20 (×2): 500 mg via ORAL
  Filled 2016-05-19 (×2): qty 1

## 2016-05-19 MED ORDER — SODIUM CHLORIDE 0.9 % IJ SOLN
INTRAMUSCULAR | Status: DC | PRN
  Administered 2016-05-19: 25 ug via INTRA_ARTERIAL

## 2016-05-19 MED ORDER — FENTANYL CITRATE (PF) 100 MCG/2ML IJ SOLN
25.0000 ug | INTRAMUSCULAR | Status: DC | PRN
Start: 2016-05-19 — End: 2016-05-19

## 2016-05-19 MED ORDER — PHENYLEPHRINE HCL 10 MG/ML IJ SOLN
INTRAVENOUS | Status: DC | PRN
  Administered 2016-05-19: 25 ug/min via INTRAVENOUS
  Administered 2016-05-19: 12:00:00 via INTRAVENOUS

## 2016-05-19 MED ORDER — IOPAMIDOL (ISOVUE-370) INJECTION 76%
INTRAVENOUS | Status: AC
  Administered 2016-05-19: 50 mL
  Filled 2016-05-19: qty 50

## 2016-05-19 MED ORDER — LACTATED RINGERS IV SOLN
INTRAVENOUS | Status: DC | PRN
  Administered 2016-05-19 (×2): via INTRAVENOUS

## 2016-05-19 MED ORDER — ASPIRIN 325 MG PO TABS
325.0000 mg | ORAL_TABLET | Freq: Every day | ORAL | Status: DC
  Filled 2016-05-19: qty 1

## 2016-05-19 MED ORDER — DILTIAZEM HCL ER COATED BEADS 240 MG PO CP24
240.0000 mg | ORAL_CAPSULE | Freq: Every day | ORAL | Status: DC
  Administered 2016-05-20: 240 mg via ORAL
  Filled 2016-05-19: qty 1

## 2016-05-19 MED ORDER — IOPAMIDOL (ISOVUE-300) INJECTION 61%
INTRAVENOUS | Status: AC
  Administered 2016-05-19: 55 mL
  Filled 2016-05-19: qty 150

## 2016-05-19 MED ORDER — TICAGRELOR 90 MG PO TABS: 45.0000 mg | ORAL_TABLET | Freq: Two times a day (BID) | ORAL | Status: DC

## 2016-05-19 MED ORDER — SODIUM CHLORIDE 0.9 % IV SOLN: INTRAVENOUS | Status: DC

## 2016-05-19 MED ORDER — IRBESARTAN 75 MG PO TABS
37.5000 mg | ORAL_TABLET | Freq: Every day | ORAL | Status: DC
  Administered 2016-05-19 – 2016-05-20 (×2): 37.5 mg via ORAL
  Filled 2016-05-19 (×2): qty 0.5

## 2016-05-19 MED ORDER — MECLIZINE HCL 25 MG PO TABS
25.0000 mg | ORAL_TABLET | Freq: Three times a day (TID) | ORAL | Status: DC | PRN
  Filled 2016-05-19: qty 1

## 2016-05-19 MED ORDER — TAMSULOSIN HCL 0.4 MG PO CAPS
0.4000 mg | ORAL_CAPSULE | Freq: Every day | ORAL | Status: DC
  Administered 2016-05-20: 0.4 mg via ORAL
  Filled 2016-05-19 (×2): qty 1

## 2016-05-19 MED ORDER — VANCOMYCIN HCL IN DEXTROSE 1-5 GM/200ML-% IV SOLN
INTRAVENOUS | Status: AC
  Filled 2016-05-19: qty 200

## 2016-05-19 MED ORDER — SUGAMMADEX SODIUM 200 MG/2ML IV SOLN
INTRAVENOUS | Status: DC | PRN
  Administered 2016-05-19: 200 mg via INTRAVENOUS

## 2016-05-19 MED ORDER — SODIUM CHLORIDE 0.9 % IV SOLN
INTRAVENOUS | Status: DC
  Administered 2016-05-19: 14:00:00 via INTRAVENOUS
  Administered 2016-05-20: 1000 mL via INTRAVENOUS

## 2016-05-19 MED ORDER — SIMVASTATIN 20 MG PO TABS
20.0000 mg | ORAL_TABLET | Freq: Every evening | ORAL | Status: DC
Start: 2016-05-19 — End: 2016-05-20
  Administered 2016-05-19: 20 mg via ORAL
  Filled 2016-05-19: qty 1

## 2016-05-19 MED ORDER — ROCURONIUM BROMIDE 10 MG/ML (PF) SYRINGE
PREFILLED_SYRINGE | INTRAVENOUS | Status: DC | PRN
Start: 2016-05-19 — End: 2016-05-19
  Administered 2016-05-19 (×3): 10 mg via INTRAVENOUS
  Administered 2016-05-19: 50 mg via INTRAVENOUS

## 2016-05-19 MED ORDER — ACETAMINOPHEN 500 MG PO TABS: 1000.0000 mg | ORAL_TABLET | Freq: Four times a day (QID) | ORAL | Status: DC | PRN

## 2016-05-19 MED ORDER — IOPAMIDOL (ISOVUE-300) INJECTION 61%
INTRAVENOUS | Status: AC
  Filled 2016-05-19: qty 150

## 2016-05-19 MED ORDER — HEPARIN SODIUM (PORCINE) 1000 UNIT/ML IJ SOLN
INTRAMUSCULAR | Status: DC | PRN
  Administered 2016-05-19: 2000 [IU] via INTRAVENOUS
  Administered 2016-05-19: 500 [IU] via INTRAVENOUS
  Administered 2016-05-19: 1000 [IU] via INTRAVENOUS
  Administered 2016-05-19 (×2): 500 [IU] via INTRAVENOUS

## 2016-05-19 MED ORDER — ACETAMINOPHEN 650 MG RE SUPP
650.0000 mg | Freq: Four times a day (QID) | RECTAL | Status: DC | PRN
  Filled 2016-05-19: qty 1

## 2016-05-19 MED ORDER — LIDOCAINE HCL 1 % IJ SOLN
INTRAMUSCULAR | Status: AC
  Filled 2016-05-19: qty 20

## 2016-05-19 NOTE — Progress Notes (Signed)
Pharmacy called spoke with Sam states she will have to follow up with family after procedure for the rest of him meds- to complete med rec.
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
## 2016-05-19 NOTE — Anesthesia Procedure Notes (Addendum)
Procedure Name: Intubation Date/Time: 05/19/2016 9:16 AM Performed by: Everlean Cherry A Pre-anesthesia Checklist: Patient identified, Emergency Drugs available, Suction available and Patient being monitored Patient Re-evaluated:Patient Re-evaluated prior to inductionOxygen Delivery Method: Circle system utilized Preoxygenation: Pre-oxygenation with 100% oxygen Intubation Type: IV induction Ventilation: Mask ventilation without difficulty Laryngoscope Size: Miller and 2 Grade View: Grade I Tube type: Oral Tube size: 7.5 mm Number of attempts: 1 Airway Equipment and Method: Stylet Placement Confirmation: ETT inserted through vocal cords under direct vision,  positive ETCO2 and breath sounds checked- equal and bilateral Secured at: 22 cm Tube secured with: Tape Dental Injury: Teeth and Oropharynx as per pre-operative assessment

## 2016-05-19 NOTE — Transfer of Care (Signed)
Immediate Anesthesia Transfer of Care Note  Patient: Shaun James  Procedure(s) Performed: Procedure(s): Embolization (N/A)  Patient Location: PACU  Anesthesia Type:General  Level of Consciousness: awake, alert , oriented and patient cooperative  Airway & Oxygen Therapy: Patient Spontanous Breathing and Patient connected to nasal cannula oxygen  Post-op Assessment: Report given to RN, Post -op Vital signs reviewed and stable and Patient moving all extremities X 4  Post vital signs: Reviewed and stable  Last Vitals:  Vitals:   05/19/16 0706  BP: 131/69  Pulse: 61  Resp: 18  Temp: 36.4 C    Last Pain:  Vitals:   05/19/16 0803  TempSrc:   PainSc: 3       Patients Stated Pain Goal: 2 ([id number])  Complications: No apparent anesthesia complications

## 2016-05-19 NOTE — Progress Notes (Addendum)
Patient ID: CLEE COMAR, male   DOB: 08/15/1931, 82 y.o.   MRN: [id number] INR . Post  procedure events as per PA note. CTA and US of the carotids reveal no evidence of carotid complex injury ie pseudoaneurysm  Or extravasation. Presently   LT angle of jaw soft tissue mass feels smaller as per patient . DEnies any dyphagia  Or lt ear ache. Neurologically no sig complaints eccept for a transient spell of vertigo without any other symptoms. Patients IV heparin was stopped for approx 1 hour abdominal CT scan was approc 160 secs. VS stable with cuff pressure in the 120s /130s.over 21s /80s. PaOS >95 % HR 60s to 80s SR RT groin soft No palpable hematoma.DIstal pulses +1 bilaterally.  Plan . 1. Continue close neuro monitoring.. 2. IV heparin and IV cardene.  3.Warm compresses to LT neck tender firm mass.?hematoma into masseter v acute parotid gland duct obstruction. 4.D/W patient,wife and daughter.  Arlean Hopping MD

## 2016-05-19 NOTE — Progress Notes (Signed)
Referring Physician(s): Dr. Cathlean Cower  Supervising Physician: Luanne Bras  Patient Status:  Inpatient  Chief Complaint:  S/p lt common carotid arteriogram,and Lt VA angiogram follwed by placement of LI MCA/ICA flow diverter for wide necked supraclinoid aneurysm, and intra device angioplasty for high grade stenosis with approx 50 % stenosis post angioplasty  Subjective:  Shaun James is seen on post op rounds by Dr. Estanislado Pandy  He c/o some swelling of the left neck area.  He is not having any trouble breathing or swallowing at this time.  Allergies: Codeine; Penicillins; and Zyrtec [cetirizine hcl]  Medications: Prior to Admission medications   Medication Sig Start Date End Date Taking? Authorizing Provider  aspirin EC 81 MG tablet Take 81 mg by mouth daily.   Yes Historical Provider, MD  diltiazem (CARTIA XT) 240 MG 24 hr capsule Take 240 mg by mouth daily.   Yes Historical Provider, MD  prednisoLONE acetate (PRED FORTE) 1 % ophthalmic suspension Place 1 drop into both eyes 4 (four) times daily as needed (for dry eyes).  01/20/16  Yes Historical Provider, MD  RANEXA 500 MG 12 hr tablet TAKE 1 TABLET TWICE A DAY Patient taking differently: Take 500 mg by mouth twice daily 04/05/16  Yes Belva Crome, MD  simvastatin (ZOCOR) 20 MG tablet Take 1 tablet (20 mg total) by mouth every evening. 05/03/16  Yes Binnie Rail, MD  tamsulosin (FLOMAX) 0.4 MG CAPS capsule Take 0.4 mg by mouth daily.   Yes Historical Provider, MD  ticagrelor (BRILINTA) 90 MG TABS tablet Take 45 mg by mouth 2 (two) times daily.    Yes Historical Provider, MD  valsartan (DIOVAN) 320 MG tablet TAKE ONE-HALF (1/2) TABLET DAILY Patient taking differently: Take 160 mg by mouth daily 12/15/15  Yes Binnie Rail, MD  acetaminophen (TYLENOL) 500 MG tablet Take 1 tablet (500 mg total) by mouth daily as needed for headache. 05/12/14   Delos Haring, PA-C  apixaban (ELIQUIS) 2.5 MG TABS tablet Take 1 tablet (2.5 mg total) by  mouth 2 (two) times daily. Patient not taking: Reported on 05/19/2016 04/10/15   Rowe Clack, MD  meclizine (ANTIVERT) 25 MG tablet Take 1 tablet (25 mg total) by mouth 3 (three) times daily as needed for dizziness. 03/17/16   Biagio Borg, MD  nitroGLYCERIN (NITROSTAT) 0.4 MG SL tablet PLACE 1 TABLET UNDER THE TONGUE EVERY 5 MINUTES AS NEEDED FOR CHEST PAIN Patient taking differently: PLACE 0.4 MG UNDER THE TONGUE EVERY 5 MINUTES AS NEEDED FOR CHEST PAIN 03/08/16   Evans Lance, MD     Vital Signs: BP 103/69 (BP Location: Right Arm)   Pulse 69   Temp 97.4 F (36.3 C) (Oral)   Resp (!) 24   Ht 5\' 3"  (1.6 m)   Wt 137 lb 5.6 oz (62.3 kg)   SpO2 95%   BMI 24.33 kg/m   Physical Exam  Awake and alert NAD Swelling of the left neck just below the level of the jawline, approximately the size of an egg. No focal neurological deficits.  Imaging: No results found.  Labs:  CBC:  Recent Labs  02/17/16 0945 03/10/16 1348 04/06/16 0650 05/19/16 0644  WBC 6.7 5.6 10.3 8.5  HGB 14.4 14.7 14.2 13.7  HCT 42.2 42.3 41.6 41.0  PLT 230.0 190 201 214    COAGS:  Recent Labs  03/10/16 1348 04/06/16 0650 05/19/16 0644  INR 1.18 1.13 1.06  APTT 34 30 30  BMP:  Recent Labs  02/17/16 0945 03/10/16 1348 04/06/16 0650 05/19/16 0644  NA 137 138 136 135  K 4.0 4.3 4.0 3.8  CL 103 105 104 100*  CO2 29 26 25  20*  GLUCOSE 106* 96 112* 111*  BUN 13 11 11 13   CALCIUM 9.4 9.5 9.1 8.7*  CREATININE 1.09 0.97 1.04 1.03  GFRNONAA  --  >60 >60 >60  GFRAA  --  >60 >60 >60    LIVER FUNCTION TESTS:  Recent Labs  02/17/16 0945 03/10/16 1348 05/19/16 0644  BILITOT 0.9 0.8 0.3  AST 16 21 19   ALT 13 15* 13*  ALKPHOS 59 56 54  PROT 7.1 7.0 6.7  ALBUMIN 4.4 4.2 3.9    Assessment and Plan:  S/p lt common carotid arteriogram,and Lt VA angiogram follwe dby placement of LI MCA/ICA flow diverter for wide necked supraclinoid aneurysm,and intra device angioplasty for high grade  stenosis with approx 50 % stenosis post angioplasty by Dr. Estanislado Pandy  Now with swelling just below the level of the mandible  Will obtain STAT CT scan of the neck to evaluate for carotid pseudoaneurysm.  Electronically Signed: Murrell Redden PA-C 05/19/2016, 3:22 PM   I spent a total of 15 Minutes at the the patient's bedside AND on the patient's hospital floor or unit, greater than 50% of which was counseling/coordinating care for post op f/u carotid cerebral angiogram with angioplasty

## 2016-05-19 NOTE — Progress Notes (Signed)
Dr Estanislado Pandy in to see pt states to hold Nimotop and to give 45mg  of Brilinta and Aspirin to equal 325mg  Orders placed

## 2016-05-19 NOTE — Sedation Documentation (Signed)
6 Fr. Exoseal to right groin. 

## 2016-05-19 NOTE — Anesthesia Postprocedure Evaluation (Signed)
Anesthesia Post Note  Patient: Shaun James  Procedure(s) Performed: Procedure(s) (LRB): Embolization (N/A)  Patient location during evaluation: PACU    Last Vitals:  Vitals:   05/19/16 1355 05/19/16 1400  BP:    Pulse: 70 69  Resp: 19 (!) 24  Temp:      Last Pain:  Vitals:   05/19/16 0803  TempSrc:   PainSc: 3                  Meygan Kyser DANIEL

## 2016-05-19 NOTE — Procedures (Signed)
S/p lt common carotid arteriogram,and Lt VA angiogram follwe dby placement of LI MCA/ICA flow diverter for wide necked supraclinoid aneurysm,and intra device angioplasty for high grade stenosis with approx 50 % stenosis post angioplasty

## 2016-05-19 NOTE — Progress Notes (Signed)
*  PRELIMINARY RESULTS* Vascular Ultrasound Left Carotid Duplex has been completed.  Preliminary findings:  No significant (1-39%) ICA stenosis. Antegrade vertebral flow.  Can compare to CTA neck, which is Gold Standard for evaluating carotid arteries.    Landry Mellow, RDMS, RVT  05/19/2016, 5:23 PM
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
## 2016-05-19 NOTE — Anesthesia Preprocedure Evaluation (Addendum)
Anesthesia Evaluation  Patient identified by MRN, date of birth, ID band Patient awake    Reviewed: Allergy & Precautions, H&P , NPO status , Patient's Chart, lab work & pertinent test results  Airway Mallampati: II  TM Distance: >3 FB Neck ROM: Full    Dental no notable dental hx. (+) Teeth Intact, Dental Advisory Given   Pulmonary neg pulmonary ROS, former smoker,    Pulmonary exam normal breath sounds clear to auscultation       Cardiovascular hypertension, Pt. on medications + CAD, + Past MI, + CABG and + Peripheral Vascular Disease  + dysrhythmias Atrial Fibrillation + pacemaker + Valvular Problems/Murmurs AS  Rhythm:Regular Rate:Normal + Systolic murmurs    Neuro/Psych  Headaches, negative psych ROS   GI/Hepatic Neg liver ROS, GERD  Controlled,  Endo/Other  negative endocrine ROS  Renal/GU negative Renal ROS  negative genitourinary   Musculoskeletal  (+) Arthritis , Osteoarthritis,    Abdominal   Peds  Hematology negative hematology ROS (+)   Anesthesia Other Findings   Reproductive/Obstetrics negative OB ROS                            Anesthesia Physical Anesthesia Plan  ASA: III  Anesthesia Plan: General   Post-op Pain Management:    Induction: Intravenous  Airway Management Planned: Oral ETT  Additional Equipment: Arterial line  Intra-op Plan:   Post-operative Plan: Extubation in OR and Possible Post-op intubation/ventilation  Informed Consent: I have reviewed the patients History and Physical, chart, labs and discussed the procedure including the risks, benefits and alternatives for the proposed anesthesia with the patient or authorized representative who has indicated his/her understanding and acceptance.   Dental advisory given  Plan Discussed with: CRNA  Anesthesia Plan Comments:         Anesthesia Quick Evaluation
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
## 2016-05-20 ENCOUNTER — Encounter (HOSPITAL_COMMUNITY): Payer: Self-pay | Admitting: Interventional Radiology

## 2016-05-20 DIAGNOSIS — I10 Essential (primary) hypertension: Secondary | ICD-10-CM

## 2016-05-20 DIAGNOSIS — I671 Cerebral aneurysm, nonruptured: Principal | ICD-10-CM

## 2016-05-20 DIAGNOSIS — I2510 Atherosclerotic heart disease of native coronary artery without angina pectoris: Secondary | ICD-10-CM

## 2016-05-20 DIAGNOSIS — I480 Paroxysmal atrial fibrillation: Secondary | ICD-10-CM

## 2016-05-20 LAB — CBC
HEMATOCRIT: 33 % — AB (ref 39.0–52.0)
HEMOGLOBIN: 11.1 g/dL — AB (ref 13.0–17.0)
MCH: 30.5 pg (ref 26.0–34.0)
MCHC: 33.6 g/dL (ref 30.0–36.0)
MCV: 90.7 fL (ref 78.0–100.0)
Platelets: 195 10*3/uL (ref 150–400)
RBC: 3.64 MIL/uL — ABNORMAL LOW (ref 4.22–5.81)
RDW: 13.1 % (ref 11.5–15.5)
WBC: 7.8 10*3/uL (ref 4.0–10.5)

## 2016-05-20 LAB — APTT: aPTT: 53 seconds — ABNORMAL HIGH (ref 24–36)

## 2016-05-20 LAB — VAS US CAROTID
RCCAPDIAS: -20 cm/s
RCCAPSYS: -66 cm/s
RIGHT ECA DIAS: 12 cm/s
RIGHT VERTEBRAL DIAS: 21 cm/s
Right cca dist sys: -55 cm/s

## 2016-05-20 LAB — PLATELET INHIBITION P2Y12: Platelet Function  P2Y12: 56 [PRU] — ABNORMAL LOW (ref 194–418)
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
## 2016-05-20 NOTE — Discharge Summary (Signed)
Patient ID: Shaun James MRN: [id number] DOB/AGE: 22-Oct-1930 82 y.o.  Admit date: 05/19/2016 Discharge date: 05/20/2016  Supervising Physician: Luanne Bras  Admission Diagnoses:  L ICA aueurysm  L ICA stenosis  Discharge Diagnoses:  Active Problems:   Brain aneurysm   Discharged Condition: good  Hospital Course:   Shaun James is a 82 y.o. male who has suffered with dizziness and vertigo for months. He also has hearing loss bilaterally.  CTA done 03/10/16 showed L ICA stenosis and Left posterior communicating artery aneurysm  Arteriogram on 04/06/16 showed an approximately 5.3 mm x 4.8 mm saccular aneurysm arising from the posterior wall of the left internal carotid artery just at the origin of the left posterior communicating artery and an 80-85% stenosis of the left internal carotid artery supraclinoid segment just proximal to the neck of the aneurysm.  P2Y12 yesterday was 8 so Dr Estanislado Pandy proceeded with a left common carotid arteriogram, and Lt VA angiogram followed by placement of Left MCA/ICA flow diverter for wide necked supraclinoid aneurysm,and intra device angioplasty for high grade stenosis with approx 50 % stenosis post angioplasty.  Post operatively he developed some swelling just below the level of the mandible.  CT scan revealed apparent spontaneous hemorrhage within the left masseter muscle, parotid gland and adjacent soft tissues.  Otherwise he has done well.  He has voided, ambulated, and is tolerating a diet.  He is ready for discharge home.  Consults: None  Significant Diagnostic Studies:  EXAM: CT ANGIOGRAPHY HEAD AND NECK  TECHNIQUE: Multidetector CT imaging of the head and neck was performed using the standard protocol during bolus administration of intravenous contrast. Multiplanar CT image reconstructions and MIPs were obtained to evaluate the vascular anatomy. Carotid stenosis measurements (when applicable) are obtained utilizing  NASCET criteria, using the distal internal carotid diameter as the denominator.  CONTRAST:  50 cc Isovue 370  COMPARISON:  03/10/2016  FINDINGS: CT HEAD  Interval placement of a pipeline stent from the supraclinoid ICA on the left into the left M1 segment. No evidence of intracranial hemorrhage or acute infarction. The brain again shows chronic small-vessel ischemic changes throughout the white matter and atherosclerotic calcification of the major vessels at the base of the brain. No hydrocephalus. No extra-axial collection. No skull fracture.  CTA NECK  Aortic arch: Pronounced atherosclerosis of the aortic arch. No aneurysm or dissection. Branching pattern of the brachiocephalic vessels is normal without flow limiting origin stenosis.  Right carotid system: Common carotid artery widely patent to the bifurcation. Atherosclerotic disease at the carotid bifurcation on the right. Minimal diameter is 5 mm. Compared to a more distal cervical ICA diameter of 5 mm, there is no stenosis.  Left carotid system: Common carotid artery widely patent to the bifurcation. No evidence of carotid artery injury. Atherosclerotic disease at the carotid bifurcation with minimal diameter of the proximal ICA of 3.5 mm. Compared to a more distal cervical ICA diameter of 5 mm, this indicates a 30% stenosis. The external carotid artery shows 25% stenosis at its origin. I do not see any pseudo aneurysm of the external branches. There is no convincing evidence of active extravasation.  Vertebral arteries:Both vertebral arteries are patent with the right being dominant. No vertebral artery origin stenosis or stenosis in the cervical region.  Skeleton: Ordinary spondylosis.  Other neck: Nonspecific edema pattern within the left parotid gland and probably with some involvement of the left masseter muscle. This is consistent with hemorrhage in the setting of anticoagulation.  There is no  evidence of pseudoaneurysm to explain this. I would favor that the primary hemorrhage occurred within the masseter muscle with extension out into the parotid and adjacent soft tissues.  Upper chest: Apical pleural and parenchymal scarring.  CTA HEAD  Anterior circulation: Both internal carotid arteries are patent through the skullbase. There is atherosclerotic disease in the carotid siphon regions bilaterally. Pipeline stent placed in the distal siphon and supraclinoid ICA on the left extending into the left M1 segment. Narrowing in the proximal portion of the stent as shown at angiography. There is still partial opacification of the posteriorly projecting supraclinoid ICA aneurysm. Flow present through the stent. No complicating features. Hypoplastic A1 segment on the left. Azygos anterior cerebral artery.  Posterior circulation: Both vertebral arteries are patent through the foramen magnum to the basilar. No basilar stenosis. Posterior circulation branch vessels are patent.  Venous sinuses: Patent and normal.  Anatomic variants: As noted above, azygos anterior cerebral artery.  Delayed phase: No abnormal enhancement  IMPRESSION: Apparent spontaneous hemorrhage within the left masseter muscle, parotid gland and adjacent soft tissues. I do not see evidence of active extravasation or pseudo aneurysm.  Atherosclerotic disease at both carotid bifurcations. No stenosis on the right. 30% stenosis on the left.  Pipeline stent placed on the left. No intracranial complications seen relative to that. Narrowing within the proximal portion of the stent as shown at angiography. Partial opacification of the posterior projecting supraclinoid ICA aneurysm.   Electronically Signed   By: Nelson Chimes M.D.   On: 05/19/2016 16:14   Treatments:   S/p lt common carotid arteriogram,and Lt VA angiogram follwe dby placement of LI MCA/ICA flow diverter for wide necked  supraclinoid aneurysm,and intra device angioplasty for high grade stenosis with approx 50 % stenosis post angioplasty by Dr. Estanislado Pandy 05/19/2016  Discharge Exam: Blood pressure 122/86, pulse 75, temperature 98.3 F (36.8 C), temperature source Oral, resp. rate 18, height 5\' 3"  (1.6 m), weight 137 lb 5.6 oz (62.3 kg), SpO2 97 %. Awake and alert NAD No cranial nerve deficits Speech normal Face symmetrical No drift PERRL/EOMI Tongue midline Strength 5/5 all fours CFA puncture site looks good, no hematoma/pseudoaneurysm Heart RRR Intact distal pulses.  Disposition: 01-Home or Self Care  Patient will follow up with Dr. Estanislado Pandy in 2 weeks.  He is to take Brilinta 45 mg and an aspirin PO q day He is to restart Eliquis in 1 week then stop the aspirin    Medication List    TAKE these medications   acetaminophen 500 MG tablet Commonly known as:  TYLENOL Take 1 tablet (500 mg total) by mouth daily as needed for headache.   apixaban 2.5 MG Tabs tablet Commonly known as:  ELIQUIS Take 1 tablet (2.5 mg total) by mouth 2 (two) times daily.   aspirin EC 81 MG tablet Take 81 mg by mouth daily.   CARTIA XT 240 MG 24 hr capsule Generic drug:  diltiazem Take 240 mg by mouth daily.   meclizine 25 MG tablet Commonly known as:  ANTIVERT Take 1 tablet (25 mg total) by mouth 3 (three) times daily as needed for dizziness.   nitroGLYCERIN 0.4 MG SL tablet Commonly known as:  NITROSTAT PLACE 1 TABLET UNDER THE TONGUE EVERY 5 MINUTES AS NEEDED FOR CHEST PAIN What changed:  See the new instructions.   prednisoLONE acetate 1 % ophthalmic suspension Commonly known as:  PRED FORTE Place 1 drop into both eyes 4 (four) times daily as needed (for  dry eyes).   RANEXA 500 MG 12 hr tablet Generic drug:  ranolazine TAKE 1 TABLET TWICE A DAY What changed:  See the new instructions.   simvastatin 20 MG tablet Commonly known as:  ZOCOR Take 1 tablet (20 mg total) by mouth every evening.     tamsulosin 0.4 MG Caps capsule Commonly known as:  FLOMAX Take 0.4 mg by mouth daily.   ticagrelor 90 MG Tabs tablet Commonly known as:  BRILINTA Take 45 mg by mouth 2 (two) times daily.   valsartan 320 MG tablet Commonly known as:  DIOVAN TAKE ONE-HALF (1/2) TABLET DAILY What changed:  See the new instructions.         Electronically Signed: Murrell Redden PA-C 05/20/2016, 11:29 AM   I have spent Less Than 30 Minutes discharging Shaun James.

## 2016-05-21 ENCOUNTER — Telehealth: Payer: Self-pay | Admitting: *Deleted

## 2016-05-21 ENCOUNTER — Telehealth: Payer: Self-pay | Admitting: Internal Medicine

## 2016-05-21 LAB — POCT ACTIVATED CLOTTING TIME: Activated Clotting Time: 169 seconds

## 2016-05-21 MED ORDER — ONDANSETRON 4 MG PO TBDP: 4.0000 mg | ORAL_TABLET | Freq: Three times a day (TID) | ORAL | 0 refills | Status: DC | PRN

## 2016-05-21 NOTE — Telephone Encounter (Signed)
Spoke with pts daughter to inform.  

## 2016-05-21 NOTE — Telephone Encounter (Signed)
Sent to pof 

## 2016-05-21 NOTE — Telephone Encounter (Signed)
Pt daughter called in this morning and said that pt had surgery and need Zofran called in, she wants to know if this can be called in?    Best number -[phone] Pharmacy - [facility] on [address]

## 2016-05-21 NOTE — Telephone Encounter (Signed)
Pt was on TCM list admitted for L ICA aueurysm andL ICA stenosis.  P2Y12 yesterday was 35 so Dr Estanislado Pandy proceeded with a left common carotid arteriogram, and Lt VA angiogram followed by placement of Left MCA/ICA flow diverter for wide necked supraclinoid aneurysm,and intra device angioplasty for high grade stenosis with approx 50 % stenosis post angioplasty. Pt D/C 9/7. And will f/u w/Dr. Darlin Coco in 2 weeks...Johny Chess

## 2016-05-24 ENCOUNTER — Other Ambulatory Visit: Payer: Self-pay | Admitting: Physician Assistant

## 2016-05-24 ENCOUNTER — Other Ambulatory Visit (HOSPITAL_COMMUNITY): Payer: Self-pay | Admitting: Interventional Radiology

## 2016-05-24 ENCOUNTER — Telehealth (HOSPITAL_COMMUNITY): Payer: Self-pay | Admitting: Radiology

## 2016-05-24 ENCOUNTER — Encounter (HOSPITAL_COMMUNITY): Payer: Self-pay | Admitting: Interventional Radiology

## 2016-05-24 DIAGNOSIS — I671 Cerebral aneurysm, nonruptured: Secondary | ICD-10-CM

## 2016-05-24 LAB — PLATELET INHIBITION P2Y12: Platelet Function  P2Y12: 26 [PRU] — ABNORMAL LOW (ref 194–418)

## 2016-05-24 NOTE — Telephone Encounter (Signed)
Called and spoke to pt's daughter, Freda Munro. Pt is having extensive bruising on his right arm and left neck. He is having right shoulder pain down to his wrist that comes and goes. He is not dizzy and can ambulate on his own. His has no visual disturbance and no headaches. He does complain of fatigue, some nausea, and memory loss. I will speak with Deveshwar and call pt and daughter back with instructions. Pt and daughter were both agreeable to this plan. JM

## 2016-05-26 ENCOUNTER — Encounter: Payer: Self-pay | Admitting: *Deleted

## 2016-05-28 ENCOUNTER — Ambulatory Visit (INDEPENDENT_AMBULATORY_CARE_PROVIDER_SITE_OTHER): Payer: Medicare Other | Admitting: Family

## 2016-05-28 ENCOUNTER — Telehealth (HOSPITAL_COMMUNITY): Payer: Self-pay | Admitting: *Deleted

## 2016-05-28 ENCOUNTER — Encounter: Payer: Self-pay | Admitting: Family

## 2016-05-28 DIAGNOSIS — I809 Phlebitis and thrombophlebitis of unspecified site: Secondary | ICD-10-CM

## 2016-05-28 DIAGNOSIS — Z23 Encounter for immunization: Secondary | ICD-10-CM

## 2016-05-28 NOTE — Patient Instructions (Signed)
Thank you for choosing [facility].  SUMMARY AND INSTRUCTIONS:  Use tylenol as needed for discomfort.  Keep your arm elevated about your heart.  Ice / Cool compresses x 20 minutes every 2 hours as needed.   If your symptoms do not improve or worsen please follow up with Urgent Care if over the weekend.   Medication:  Please continue to take your medication as prescribed  Your prescription(s) have been submitted to your pharmacy or been printed and provided for you. Please take as directed and contact our office if you believe you are having problem(s) with the medication(s) or have any questions.  Follow up:  If your symptoms worsen or fail to improve, please contact our office for further instruction, or in case of emergency go directly to the emergency room at the closest medical facility.

## 2016-05-28 NOTE — Assessment & Plan Note (Addendum)
Symptoms and exam consistent with possible phlebitis with no evidence of blood clots and is covered with Eliquis for anticoagulation. Eliquis may also complicate healing.  Recommend conservative treatment with Tylenol, ice, and elevation. Continue to monitor symptoms and for inflammation. Follow-up for any worsening.

## 2016-05-28 NOTE — Telephone Encounter (Signed)
texted patient in regards to bruising.  Await his response

## 2016-05-28 NOTE — Progress Notes (Signed)
Subjective:    Patient ID: Shaun James, male    DOB: [birth year], 82 y.o.   MRN: [id number]  Chief Complaint  Patient presents with  . Bruise    right arm has a bruise from wrist to arm pit, had blood drawn from that arm, now has numbness in the middle 2 fingers on right arm, in a lot of pain    HPI:  Shaun James is a 82 y.o. male who  has a past medical history of AAA (abdominal aortic aneurysm) ([place]); Allergic rhinitis; Aortic sclerosis ([place]); Aortic stenosis; Arthritis; Atrial fibrillation ([place]); Atrial flutter ([place]); Basal cell carcinoma; Benign positional vertigo; BPH (benign prostatic hyperplasia); CAD in native artery; Cardiac pacemaker in situ; Coronary atherosclerosis of native coronary artery; Diverticulosis of colon (without mention of hemorrhage); DJD (degenerative joint disease); Esophageal reflux; Essential hypertension, benign; GERD (gastroesophageal reflux disease); Headache; History of tuberculosis; Inflamed seborrheic keratosis; Intermittent confusion; Internal hemorrhoids; Mixed hyperlipidemia; Mouth burn; Myocardial infarction ([place]); Near syncope; Osteoarthritis (06/14/2012); Peptic ulcer, unspecified site, unspecified as acute or chronic, without mention of hemorrhage, perforation, or obstruction; Personal history of colonic polyps; Pneumonia; Presbycusis of both ears; Presence of permanent cardiac pacemaker; Prostatitis (dx 12/2102); SSS (sick sinus syndrome) ([place]); and Systolic murmur. and presents today for acute office visit.   This is a new problem. Associated symptom of pain, discoloration and brusing located around his right elbow has been going on for about 9 days following a prodeure for an aneurysm coiling. Described as a pain and and has been staying about the same. He is on Eliquis.  Modifying factors include heat which did not help very much. No fevers. Does have some numbness and tingling located in his 3rd and 4th fingers.    Allergies  Allergen Reactions  .  Codeine Rash  . Penicillins Rash    Has patient had a PCN reaction causing immediate rash, facial/tongue/throat swelling, SOB or lightheadedness with hypotension: Yes Has patient had a PCN reaction causing severe rash involving mucus membranes or skin necrosis: No Has patient had a PCN reaction that required hospitalization No Has patient had a PCN reaction occurring within the last 10 years: No If all of the above answers are "NO", then may proceed with Cephalosporin use.   Alethia Berthold [Cetirizine Hcl] Rash           Outpatient Medications Prior to Visit  Medication Sig Dispense Refill  . acetaminophen (TYLENOL) 500 MG tablet Take 1 tablet (500 mg total) by mouth daily as needed for headache. 14 tablet 0  . apixaban (ELIQUIS) 2.5 MG TABS tablet Take 1 tablet (2.5 mg total) by mouth 2 (two) times daily. 180 tablet 3  . aspirin EC 81 MG tablet Take 81 mg by mouth daily.    Marland Kitchen diltiazem (CARTIA XT) 240 MG 24 hr capsule Take 240 mg by mouth daily.    . meclizine (ANTIVERT) 25 MG tablet Take 1 tablet (25 mg total) by mouth 3 (three) times daily as needed for dizziness. 50 tablet 1  . nitroGLYCERIN (NITROSTAT) 0.4 MG SL tablet PLACE 1 TABLET UNDER THE TONGUE EVERY 5 MINUTES AS NEEDED FOR CHEST PAIN (Patient taking differently: PLACE 0.4 MG UNDER THE TONGUE EVERY 5 MINUTES AS NEEDED FOR CHEST PAIN) 25 tablet 5  . ondansetron (ZOFRAN ODT) 4 MG disintegrating tablet Take 1 tablet (4 mg total) by mouth every 8 (eight) hours as needed for nausea or vomiting. 20 tablet 0  . prednisoLONE acetate (PRED FORTE) 1 % ophthalmic  suspension Place 1 drop into both eyes 4 (four) times daily as needed (for dry eyes).   0  . RANEXA 500 MG 12 hr tablet TAKE 1 TABLET TWICE A DAY (Patient taking differently: Take 500 mg by mouth twice daily) 180 tablet 0  . simvastatin (ZOCOR) 20 MG tablet Take 1 tablet (20 mg total) by mouth every evening. 90 tablet 2  . tamsulosin (FLOMAX) 0.4 MG CAPS capsule Take 0.4 mg by mouth  daily.    . ticagrelor (BRILINTA) 90 MG TABS tablet Take 45 mg by mouth 2 (two) times daily.     . valsartan (DIOVAN) 320 MG tablet TAKE ONE-HALF (1/2) TABLET DAILY (Patient taking differently: Take 160 mg by mouth daily) 45 tablet 2   No facility-administered medications prior to visit.       Past Surgical History:  Procedure Laterality Date  . CARDIAC CATHETERIZATION  2002  . cataracts     bilateral  . COLONOSCOPY    . CORONARY ARTERY BYPASS GRAFT  1997   (LIMA to LAD, SVG to diagonal-50% closed on catheterization in 1999, SVG to OM1, SVG to PDA) Repeat cath 2009 with patent grafts  . EXCISION OF TONGUE LESION    . EYE SURGERY Bilateral    cataract surgery with lens implant  . HEMORRHOID SURGERY  07/31/2010  . INSERT / REPLACE / REMOVE PACEMAKER  08/27/2011   PPM implant  . IR GENERIC HISTORICAL  04/06/2016   IR ANGIO VERTEBRAL SEL SUBCLAVIAN INNOMINATE UNI L MOD SED 04/06/2016 Luanne Bras, MD [facility]  . IR GENERIC HISTORICAL  04/06/2016   IR ANGIO VERTEBRAL SEL VERTEBRAL UNI R MOD SED 04/06/2016 Luanne Bras, MD [facility]  . IR GENERIC HISTORICAL  04/06/2016   IR ANGIO INTRA EXTRACRAN SEL COM CAROTID INNOMINATE BILAT MOD SED 04/06/2016 Luanne Bras, MD [facility]  . IR GENERIC HISTORICAL  05/19/2016   IR ANGIO INTRA EXTRACRAN SEL INTERNAL CAROTID UNI L MOD SED 05/19/2016 Luanne Bras, MD [facility]  . IR GENERIC HISTORICAL  05/19/2016   IR NEURO EACH ADD'L AFTER BASIC UNI LEFT (MS) 05/19/2016 Luanne Bras, MD [facility]  . IR GENERIC HISTORICAL  05/19/2016   IR ANGIOGRAM FOLLOW UP STUDY 05/19/2016 Luanne Bras, MD [facility]  . IR GENERIC HISTORICAL  05/19/2016   IR ANGIO VERTEBRAL SEL VERTEBRAL UNI L MOD SED 05/19/2016 Luanne Bras, MD [facility]  . IR GENERIC HISTORICAL  05/19/2016   IR TRANSCATH/EMBOLIZ 05/19/2016 Luanne Bras, MD [facility]  . IR GENERIC HISTORICAL  05/19/2016   IR 3D INDEPENDENT WKST 05/19/2016 Luanne Bras,  MD [facility]  . left rotator cuff surgery  2000  . PERMANENT PACEMAKER INSERTION N/A 08/27/2011   Procedure: PERMANENT PACEMAKER INSERTION;  Surgeon: Evans Lance, MD;  Location: [facility] CATH LAB;  Service: Cardiovascular;  Laterality: N/A;  . PUNCH BIOPSY OF SKIN  08/2009   5 mm punch biopsy on upper mid back melanotic appearing lesion  . RADIOLOGY WITH ANESTHESIA N/A 05/19/2016   Procedure: Embolization;  Surgeon: Luanne Bras, MD;  Location: [facility];  Service: Radiology;  Laterality: N/A;      Past Medical History:  Diagnosis Date  . AAA (abdominal aortic aneurysm) ([facility])    3.1 cm AAA, reevaluated 08/2009 per ultrasound - stable  . Allergic rhinitis   . Aortic sclerosis ([place])    Probable AS on physical exam, 2010  . Aortic stenosis    mild AS 2014 echo  . Arthritis   . Atrial fibrillation (  [facility])    chronic anticoag  . Atrial flutter ([place])   . Basal cell carcinoma   . Benign positional vertigo   . BPH (benign prostatic hyperplasia)   . CAD in native artery    a) s/p CABG '98; LIMA-LAD, SVG-D1, SVG-OM1, SVG-PDA); b) CATH -2009: midLAD & RCA 100%, OM2 100% wtih severe native Cx; Grafts patent with ~30-50% SVG-D1 & ~30-40% SVG-OM, EF 45%; c) Lexiscan Cardiolite 02/2014: EF 61%, No Ischemia; subtle fixed anteroseptal defect.  . Cardiac pacemaker in [place]  . Coronary atherosclerosis of native coronary artery   . Diverticulosis of colon (without mention of hemorrhage)   . DJD (degenerative joint disease)   . Esophageal reflux   . Essential hypertension, benign   . GERD (gastroesophageal reflux disease)   . Headache    after brain aneurysm  . History of tuberculosis    remote Hx  . Inflamed seborrheic keratosis   . Intermittent confusion   . Internal hemorrhoids    severe  . Mixed hyperlipidemia   . Mouth burn   . Myocardial infarction ([place])   . Near syncope    uncertain cause. R/O arrhythmia, R/O med effect  . Osteoarthritis 06/14/2012  . Peptic  ulcer, unspecified site, unspecified as acute or chronic, without mention of hemorrhage, perforation, or obstruction   . Personal history of colonic polyps   . Pneumonia   . Presbycusis of both ears    Bilateral hearing aids  . Presence of permanent cardiac pacemaker   . Prostatitis dx 12/2102   E coli Ucx  . SSS (sick sinus syndrome) (H[facility])    syncope, s/p ppm 12/12  . Systolic murmur    Worrisome for AS. AS could cause exertional fatigue.      Review of Systems  Constitutional: Negative for chills and fever.  Skin: Positive for color change.      Objective:    BP 110/80 (BP Location: Left Arm, Patient Position: Sitting, Cuff Size: Normal)   Pulse 97   Temp 97.4 F (36.3 C) (Oral)   Resp 16   Ht 5\' 3"  (1.6 m)   Wt 133 lb 12.8 oz (60.7 kg)   SpO2 97%   BMI 23.70 kg/m  Nursing note and vital signs reviewed.  Physical Exam  Constitutional: He is oriented to person, place, and time. He appears well-developed and well-nourished. No distress.  Cardiovascular: Normal rate, regular rhythm, normal heart sounds and intact distal pulses.   Pulmonary/Chest: Effort normal and breath sounds normal.  Musculoskeletal:  Right arm - No obvious deformity with mild edema and significant discoloration. Discoloration is diffusely spread from about 1/2 the humerus down to 1/2 the forearm. Colors vary from purple/blue to green/yellow. There is no warmth to the area noted. Distal pulses are intact and appropriate. Reports some tingling of the 3-4 fingers.   Neurological: He is alert and oriented to person, place, and time.  Skin: Skin is warm and dry.  Psychiatric: He has a normal mood and affect. His behavior is normal. Judgment and thought content normal.       Assessment & Plan:   Problem List Items Addressed This Visit      Cardiovascular and Mediastinum   Phlebitis    Symptoms and exam consistent with possible phlebitis with no evidence of blood clots and is covered with Eliquis for  anticoagulation. Eliquis may also complicate healing.  Recommend conservative treatment with Tylenol, ice, and elevation. Continue to monitor symptoms and for inflammation.  Follow-up for any worsening.       Other Visit Diagnoses    Encounter for immunization       Relevant Orders   Flu vaccine HIGH DOSE PF (Completed)       I am having Mr. Eckerd maintain his acetaminophen, tamsulosin, apixaban, valsartan, nitroGLYCERIN, prednisoLONE acetate, meclizine, diltiazem, RANEXA, simvastatin, aspirin EC, ticagrelor, and ondansetron.   Follow-up: Return if symptoms worsen or fail to improve.  Mauricio Po, FNP

## 2016-05-29 ENCOUNTER — Encounter (HOSPITAL_COMMUNITY): Payer: Self-pay | Admitting: Emergency Medicine

## 2016-05-29 ENCOUNTER — Ambulatory Visit: Payer: Self-pay

## 2016-05-29 ENCOUNTER — Emergency Department (HOSPITAL_COMMUNITY)
Admission: EM | Admit: 2016-05-29 | Discharge: 2016-05-29 | Disposition: A | Discharge status: Home / Self Care | Payer: Medicare Other | Attending: Emergency Medicine | Admitting: Emergency Medicine

## 2016-05-29 ENCOUNTER — Encounter (HOSPITAL_COMMUNITY): Payer: Self-pay

## 2016-05-29 ENCOUNTER — Emergency Department (HOSPITAL_BASED_OUTPATIENT_CLINIC_OR_DEPARTMENT_OTHER)
Admit: 2016-05-29 | Discharge: 2016-05-29 | Disposition: A | Discharge status: Home / Self Care | Payer: Medicare Other | Attending: Emergency Medicine | Admitting: Emergency Medicine

## 2016-05-29 ENCOUNTER — Ambulatory Visit (HOSPITAL_COMMUNITY)
Admission: EM | Admit: 2016-05-29 | Discharge: 2016-05-29 | Disposition: A | Discharge status: Nursing Home (Includes ICF) | Payer: Medicare Other | Source: Home / Self Care | Attending: Family Medicine | Admitting: Family Medicine

## 2016-05-29 DIAGNOSIS — S40021A Contusion of right upper arm, initial encounter: Secondary | ICD-10-CM | POA: Diagnosis present

## 2016-05-29 DIAGNOSIS — M79609 Pain in unspecified limb: Secondary | ICD-10-CM

## 2016-05-29 DIAGNOSIS — Y999 Unspecified external cause status: Secondary | ICD-10-CM | POA: Insufficient documentation

## 2016-05-29 DIAGNOSIS — Y939 Activity, unspecified: Secondary | ICD-10-CM | POA: Diagnosis not present

## 2016-05-29 DIAGNOSIS — I252 Old myocardial infarction: Secondary | ICD-10-CM | POA: Diagnosis not present

## 2016-05-29 DIAGNOSIS — Z7901 Long term (current) use of anticoagulants: Secondary | ICD-10-CM | POA: Insufficient documentation

## 2016-05-29 DIAGNOSIS — Y929 Unspecified place or not applicable: Secondary | ICD-10-CM | POA: Diagnosis not present

## 2016-05-29 DIAGNOSIS — Z951 Presence of aortocoronary bypass graft: Secondary | ICD-10-CM | POA: Diagnosis not present

## 2016-05-29 DIAGNOSIS — X58XXXA Exposure to other specified factors, initial encounter: Secondary | ICD-10-CM | POA: Insufficient documentation

## 2016-05-29 DIAGNOSIS — Z7982 Long term (current) use of aspirin: Secondary | ICD-10-CM | POA: Diagnosis not present

## 2016-05-29 DIAGNOSIS — I10 Essential (primary) hypertension: Secondary | ICD-10-CM | POA: Insufficient documentation

## 2016-05-29 DIAGNOSIS — I2510 Atherosclerotic heart disease of native coronary artery without angina pectoris: Secondary | ICD-10-CM | POA: Diagnosis not present

## 2016-05-29 DIAGNOSIS — M7989 Other specified soft tissue disorders: Secondary | ICD-10-CM

## 2016-05-29 DIAGNOSIS — Z950 Presence of cardiac pacemaker: Secondary | ICD-10-CM | POA: Diagnosis not present

## 2016-05-29 DIAGNOSIS — Z87891 Personal history of nicotine dependence: Secondary | ICD-10-CM | POA: Diagnosis not present

## 2016-05-29 DIAGNOSIS — T148XXA Other injury of unspecified body region, initial encounter: Secondary | ICD-10-CM

## 2016-05-29 DIAGNOSIS — M79601 Pain in right arm: Secondary | ICD-10-CM | POA: Diagnosis not present

## 2016-05-29 MED ORDER — TRAMADOL HCL 50 MG PO TABS: 50.0000 mg | ORAL_TABLET | Freq: Four times a day (QID) | ORAL | 0 refills | Status: DC | PRN

## 2016-05-29 NOTE — ED Triage Notes (Signed)
Pt c/o right arm pain onset 10 days associated w/hematoma at site where blood was drawn.   Reports he was seen at [facility] for Aortic aneurysm on 9/5  Taking Eliquis and Asprin daily   A&O x4... NAD... Wife at bedside.

## 2016-05-29 NOTE — ED Provider Notes (Signed)
[facility] DEPT Provider Note   CSN: [id number] Arrival date & time: 05/29/16  1419     History   Chief Complaint Chief Complaint  Patient presents with  . Arm Problem    HPI Shaun James is a 82 y.o. male. With the chief complaint of right arm bruising and pain. He was referred from urgent care for consideration of right upper extremity DVT.  Patient went femoral approach carotid aneurysm stent placement with Dr. Syble Creek on September 6. He was discharged on the seventh.  He was discharged on 81 mg aspirin per day and Brilinta 45 mg. Per Dr. Syble Creek instructions he was to restart L course in one week which would be today.  He had a blood draw 2 days after he was discharged. States he developed bleeding under the skin because "the girl didn't put pressure on it". Seen in urgent care today. Has some ecchymosis. Complains of some numbness in his ulnar 2 digits of his right hand. Referred here for vascular ultrasound.    HPI  Past Medical History:  Diagnosis Date  . AAA (abdominal aortic aneurysm) (HCC)    3.1 cm AAA, reevaluated 08/2009 per ultrasound - stable  . Allergic rhinitis   . Aortic sclerosis ([place])    Probable AS on physical exam, 2010  . Aortic stenosis    mild AS 2014 echo  . Arthritis   . Atrial fibrillation (HCC)    chronic anticoag  . Atrial flutter ([place])   . Basal cell carcinoma   . Benign positional vertigo   . BPH (benign prostatic hyperplasia)   . CAD in native artery    a) s/p CABG '98; LIMA-LAD, SVG-D1, SVG-OM1, SVG-PDA); b) CATH -2009: midLAD & RCA 100%, OM2 100% wtih severe native Cx; Grafts patent with ~30-50% SVG-D1 & ~30-40% SVG-OM, EF 45%; c) Lexiscan Cardiolite 02/2014: EF 61%, No Ischemia; subtle fixed anteroseptal defect.  . Cardiac pacemaker in [place]  . Coronary atherosclerosis of native coronary artery   . Diverticulosis of colon (without mention of hemorrhage)   . DJD (degenerative joint disease)   . Esophageal  reflux   . Essential hypertension, benign   . GERD (gastroesophageal reflux disease)   . Headache    after brain aneurysm  . History of tuberculosis    remote Hx  . Inflamed seborrheic keratosis   . Intermittent confusion   . Internal hemorrhoids    severe  . Mixed hyperlipidemia   . Mouth burn   . Myocardial infarction ([place])   . Near syncope    uncertain cause. R/O arrhythmia, R/O med effect  . Osteoarthritis 06/14/2012  . Peptic ulcer, unspecified site, unspecified as acute or chronic, without mention of hemorrhage, perforation, or obstruction   . Personal history of colonic polyps   . Pneumonia   . Presbycusis of both ears    Bilateral hearing aids  . Presence of permanent cardiac pacemaker   . Prostatitis dx 12/2102   E coli Ucx  . SSS (sick sinus syndrome) (HCC)    syncope, s/p ppm 12/12  . Systolic murmur    Worrisome for AS. AS could cause exertional fatigue.    Patient Active Problem List   Diagnosis Date Noted  . Phlebitis 05/28/2016  . Brain aneurysm 05/19/2016  . AAA (abdominal aortic aneurysm) without rupture ([place]) 04/25/2016  . Pulmonary nodule 04/25/2016  . Bilateral hearing loss 03/22/2016  . Aneurysm, cerebral, nonruptured 03/11/2016  . Carotid artery stenosis 03/11/2016  . Vertigo  03/10/2016  . BPPV (benign paroxysmal positional vertigo) 03/10/2016  . Ataxia   . Right elbow pain 01/28/2016  . Rhinorrhea 08/12/2015  . Burning tongue 08/12/2015  . GERD (gastroesophageal reflux disease) 08/12/2015  . BPH without urinary obstruction 04/10/2015  . 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 Mucopurulent chronic bronchitis ([place]) 07/17/2014  . Insomnia 05/22/2014  . Chronic headache   . Osteoarthritis   . Pacemaker 10/18/2011  . Atrial flutter / Atrial Fibrillation 09/13/2011  . CAD (coronary artery disease), autologous vein bypass graft 09/13/2011  . Sick sinus syndrome ([place]) 09/13/2011  . Hyperlipidemia with target LDL less than 70 04/16/2008  .  Acute myocardial infarction ([place]) 04/16/2008  . Pulmonary fibrosis ([place]) 04/16/2008    Past Surgical History:  Procedure Laterality Date  . CARDIAC CATHETERIZATION  2002  . cataracts     bilateral  . COLONOSCOPY    . CORONARY ARTERY BYPASS GRAFT  1997   (LIMA to LAD, SVG to diagonal-50% closed on catheterization in 1999, SVG to OM1, SVG to PDA) Repeat cath 2009 with patent grafts  . EXCISION OF TONGUE LESION    . EYE SURGERY Bilateral    cataract surgery with lens implant  . HEMORRHOID SURGERY  07/31/2010  . INSERT / REPLACE / REMOVE PACEMAKER  08/27/2011   PPM implant  . IR GENERIC HISTORICAL  04/06/2016   IR ANGIO VERTEBRAL SEL SUBCLAVIAN INNOMINATE UNI L MOD SED 04/06/2016 Luanne Bras, MD [facility]  . IR GENERIC HISTORICAL  04/06/2016   IR ANGIO VERTEBRAL SEL VERTEBRAL UNI R MOD SED 04/06/2016 Luanne Bras, MD [facility]  . IR GENERIC HISTORICAL  04/06/2016   IR ANGIO INTRA EXTRACRAN SEL COM CAROTID INNOMINATE BILAT MOD SED 04/06/2016 Luanne Bras, MD [facility]  . IR GENERIC HISTORICAL  05/19/2016   IR ANGIO INTRA EXTRACRAN SEL INTERNAL CAROTID UNI L MOD SED 05/19/2016 Luanne Bras, MD [facility]  . IR GENERIC HISTORICAL  05/19/2016   IR NEURO EACH ADD'L AFTER BASIC UNI LEFT (MS) 05/19/2016 Luanne Bras, MD [facility]  . IR GENERIC HISTORICAL  05/19/2016   IR ANGIOGRAM FOLLOW UP STUDY 05/19/2016 Luanne Bras, MD [facility]  . IR GENERIC HISTORICAL  05/19/2016   IR ANGIO VERTEBRAL SEL VERTEBRAL UNI L MOD SED 05/19/2016 Luanne Bras, MD [facility]  . IR GENERIC HISTORICAL  05/19/2016   IR TRANSCATH/EMBOLIZ 05/19/2016 Luanne Bras, MD [facility]  . IR GENERIC HISTORICAL  05/19/2016   IR 3D INDEPENDENT WKST 05/19/2016 Luanne Bras, MD [facility]  . left rotator cuff surgery  2000  . PERMANENT PACEMAKER INSERTION N/A 08/27/2011   Procedure: PERMANENT PACEMAKER INSERTION;  Surgeon: Evans Lance, MD;  Location: [facility] CATH LAB;  Service:  Cardiovascular;  Laterality: N/A;  . PUNCH BIOPSY OF SKIN  08/2009   5 mm punch biopsy on upper mid back melanotic appearing lesion  . RADIOLOGY WITH ANESTHESIA N/A 05/19/2016   Procedure: Embolization;  Surgeon: Luanne Bras, MD;  Location: [facility];  Service: Radiology;  Laterality: N/A;       Home Medications    Prior to Admission medications   Medication Sig Start Date End Date Taking? Authorizing Provider  acetaminophen (TYLENOL) 500 MG tablet Take 1 tablet (500 mg total) by mouth daily as needed for headache. 05/12/14   Delos Haring, PA-C  apixaban (ELIQUIS) 2.5 MG TABS tablet Take 1 tablet (2.5 mg total) by mouth 2 (two) times daily. 04/10/15   Rowe Clack, MD  aspirin EC 81 MG tablet  Take 81 mg by mouth daily.    Historical Provider, MD  diltiazem (CARTIA XT) 240 MG 24 hr capsule Take 240 mg by mouth daily.    Historical Provider, MD  meclizine (ANTIVERT) 25 MG tablet Take 1 tablet (25 mg total) by mouth 3 (three) times daily as needed for dizziness. 03/17/16   Biagio Borg, MD  nitroGLYCERIN (NITROSTAT) 0.4 MG SL tablet PLACE 1 TABLET UNDER THE TONGUE EVERY 5 MINUTES AS NEEDED FOR CHEST PAIN Patient taking differently: PLACE 0.4 MG UNDER THE TONGUE EVERY 5 MINUTES AS NEEDED FOR CHEST PAIN 03/08/16   Evans Lance, MD  ondansetron (ZOFRAN ODT) 4 MG disintegrating tablet Take 1 tablet (4 mg total) by mouth every 8 (eight) hours as needed for nausea or vomiting. 05/21/16   Binnie Rail, MD  prednisoLONE acetate (PRED FORTE) 1 % ophthalmic suspension Place 1 drop into both eyes 4 (four) times daily as needed (for dry eyes).  01/20/16   Historical Provider, MD  RANEXA 500 MG 12 hr tablet TAKE 1 TABLET TWICE A DAY Patient taking differently: Take 500 mg by mouth twice daily 04/05/16   Belva Crome, MD  simvastatin (ZOCOR) 20 MG tablet Take 1 tablet (20 mg total) by mouth every evening. 05/03/16   Binnie Rail, MD  tamsulosin (FLOMAX) 0.4 MG CAPS capsule Take 0.4 mg by mouth daily.     Historical Provider, MD  ticagrelor (BRILINTA) 90 MG TABS tablet Take 45 mg by mouth 2 (two) times daily.     Historical Provider, MD  traMADol (ULTRAM) 50 MG tablet Take 1 tablet (50 mg total) by mouth every 6 (six) hours as needed. 05/29/16   Tanna Furry, MD  valsartan (DIOVAN) 320 MG tablet TAKE ONE-HALF (1/2) TABLET DAILY Patient taking differently: Take 160 mg by mouth daily 12/15/15   Binnie Rail, MD    Family History Family History  Problem Relation Age of Onset  . Other Father 45    [place]  . Heart attack Mother 31  . Heart disease Mother     ASHD  . COPD Brother   . Hypertension Brother   . Heart disease Brother     ASHD  . Heart disease Brother     ASHD  . Heart disease Sister     ASHD  . COPD Brother   . Coronary artery disease      Social History Social History  Substance Use Topics  . Smoking status: Former Smoker    Packs/day: 0.50    Years: 15.00    Types: Cigarettes    Quit date: 09/13/1965  . Smokeless tobacco: Never Used  . Alcohol use No     Allergies   Codeine; Penicillins; and Zyrtec [cetirizine hcl]   Review of Systems Review of Systems  Constitutional: Negative for appetite change, chills, diaphoresis, fatigue and fever.  HENT: Negative for mouth sores, sore throat and trouble swallowing.   Eyes: Negative for visual disturbance.  Respiratory: Negative for cough, chest tightness, shortness of breath and wheezing.   Cardiovascular: Negative for chest pain.  Gastrointestinal: Negative for abdominal distention, abdominal pain, diarrhea, nausea and vomiting.  Endocrine: Negative for polydipsia, polyphagia and polyuria.  Genitourinary: Negative for dysuria, frequency and hematuria.  Musculoskeletal: Negative for gait problem.       Indicates on his right arm and area of ecchymosis from his mid forearm to his mid upper arm medially.  Skin: Positive for color change. Negative for pallor and rash.  Neurological: Negative for dizziness, syncope,  light-headedness and headaches.  Hematological: Does not bruise/bleed easily.  Psychiatric/Behavioral: Negative for behavioral problems and confusion.     Physical Exam Updated Vital Signs BP 134/81   Pulse (!) 56   Temp 97.6 F (36.4 C) (Oral)   Resp 16   Ht 5\' 3"  (1.6 m)   Wt 133 lb (60.3 kg)   SpO2 98%   BMI 23.56 kg/m   Physical Exam  Constitutional: He is oriented to person, place, and time. He appears well-developed and well-nourished. No distress.  HENT:  Head: Normocephalic.  Eyes: Conjunctivae are normal. Pupils are equal, round, and reactive to light. No scleral icterus.  Neck: Normal range of motion. Neck supple. No thyromegaly present.  Cardiovascular: Normal rate and regular rhythm.  Exam reveals no gallop and no friction rub.   No murmur heard. Pulmonary/Chest: Effort normal and breath sounds normal. No respiratory distress. He has no wheezes. He has no rales.  Abdominal: Soft. Bowel sounds are normal. He exhibits no distension. There is no tenderness. There is no rebound.  Musculoskeletal: Normal range of motion.       Arms: Neurological: He is alert and oriented to person, place, and time.  Skin: Skin is warm and dry. No rash noted.  Psychiatric: He has a normal mood and affect. His behavior is normal.     ED Treatments / Results  Labs (all labs ordered are listed, but only abnormal results are displayed) Labs Reviewed - No data to display  EKG  EKG Interpretation None       Radiology No results found.  Procedures Procedures (including critical care time)  Medications Ordered in ED Medications - No data to display   Initial Impression / Assessment and Plan / ED Course  I have reviewed the triage vital signs and the nursing notes.  Pertinent labs & imaging results that were available during my care of the patient were reviewed by me and considered in my medical decision making (see chart for details).  Clinical Course    Doppler  ultrasound shows no sign of decreased SVT. Instructed him to continue his anticoagulation as instructed by his interventional radiologist. Of offer him tramadol to use versus simple Tylenol for pain. Endoscopy use heat. No additional restrictions.  Final Clinical Impressions(s) / ED Diagnoses   Final diagnoses:  Hematoma    New Prescriptions New Prescriptions   TRAMADOL (ULTRAM) 50 MG TABLET    Take 1 tablet (50 mg total) by mouth every 6 (six) hours as needed.     Tanna Furry, MD 05/29/16 [phone]

## 2016-05-29 NOTE — ED Provider Notes (Signed)
CSN: [id number]     Arrival date & time 05/29/16  1209 History   None    Chief Complaint  Patient presents with  . Arm Pain   (Consider location/radiation/quality/duration/timing/severity/associated sxs/prior Treatment) 82 y.o. male presents with pain to his right arm X 4 days with decrease in sensation to the distal portion  third and forth finger and right hand .  Per note from [place] PA  Dated 9.7.16 patient had a left common carotid arteriogram, and Lt VA angiogram followed by placement of Left MCA/ICA flow diverter for wide necked supraclinoid aneurysm,and intra device angioplasty for high grade stenosis with approx 50 % stenosis post angioplasty performed. Patient states that he has an increase in pain rating a 9/10 since blood was drawn in that arm. Patient states that he was taking Brilinta but was instructed to change back to eliquis yesterday. Patient denies any relief from magnets and creams applied prior to there arrival at this facility. Patient states that the pain is worse at night and he is physically ill from it. Patient has good pulses, cap refill < 2 hamatoma noted to brachial vein at ac site. Patient was seen at family medicine on 9.15.17 for phlebitis  Spoke with radiology and confirmed procedure was groin approach. Radiology suggests patient have a venous US performed. Patient will be sent to ED for further evaluation.        Past Medical History:  Diagnosis Date  . AAA (abdominal aortic aneurysm) (HCC)    3.1 cm AAA, reevaluated 08/2009 per ultrasound - stable  . Allergic rhinitis   . Aortic sclerosis ([place])    Probable AS on physical exam, 2010  . Aortic stenosis    mild AS 2014 echo  . Arthritis   . Atrial fibrillation (HCC)    chronic anticoag  . Atrial flutter ([place])   . Basal cell carcinoma   . Benign positional vertigo   . BPH (benign prostatic hyperplasia)   . CAD in native artery    a) s/p CABG '98; LIMA-LAD, SVG-D1, SVG-OM1, SVG-PDA); b) CATH -2009:  midLAD & RCA 100%, OM2 100% wtih severe native Cx; Grafts patent with ~30-50% SVG-D1 & ~30-40% SVG-OM, EF 45%; c) Lexiscan Cardiolite 02/2014: EF 61%, No Ischemia; subtle fixed anteroseptal defect.  . Cardiac pacemaker in [place]  . Coronary atherosclerosis of native coronary artery   . Diverticulosis of colon (without mention of hemorrhage)   . DJD (degenerative joint disease)   . Esophageal reflux   . Essential hypertension, benign   . GERD (gastroesophageal reflux disease)   . Headache    after brain aneurysm  . History of tuberculosis    remote Hx  . Inflamed seborrheic keratosis   . Intermittent confusion   . Internal hemorrhoids    severe  . Mixed hyperlipidemia   . Mouth burn   . Myocardial infarction ([place])   . Near syncope    uncertain cause. R/O arrhythmia, R/O med effect  . Osteoarthritis 06/14/2012  . Peptic ulcer, unspecified site, unspecified as acute or chronic, without mention of hemorrhage, perforation, or obstruction   . Personal history of colonic polyps   . Pneumonia   . Presbycusis of both ears    Bilateral hearing aids  . Presence of permanent cardiac pacemaker   . Prostatitis dx 12/2102   E coli Ucx  . SSS (sick sinus syndrome) (HCC)    syncope, s/p ppm 12/12  . Systolic murmur    Worrisome for AS.  AS could cause exertional fatigue.   Past Surgical History:  Procedure Laterality Date  . CARDIAC CATHETERIZATION  2002  . cataracts     bilateral  . COLONOSCOPY    . CORONARY ARTERY BYPASS GRAFT  1997   (LIMA to LAD, SVG to diagonal-50% closed on catheterization in 1999, SVG to OM1, SVG to PDA) Repeat cath 2009 with patent grafts  . EXCISION OF TONGUE LESION    . EYE SURGERY Bilateral    cataract surgery with lens implant  . HEMORRHOID SURGERY  07/31/2010  . INSERT / REPLACE / REMOVE PACEMAKER  08/27/2011   PPM implant  . IR GENERIC HISTORICAL  04/06/2016   IR ANGIO VERTEBRAL SEL SUBCLAVIAN INNOMINATE UNI L MOD SED 04/06/2016 Luanne Bras, MD [facility]  . IR GENERIC HISTORICAL  04/06/2016   IR ANGIO VERTEBRAL SEL VERTEBRAL UNI R MOD SED 04/06/2016 Luanne Bras, MD [facility]  . IR GENERIC HISTORICAL  04/06/2016   IR ANGIO INTRA EXTRACRAN SEL COM CAROTID INNOMINATE BILAT MOD SED 04/06/2016 Luanne Bras, MD [facility]  . IR GENERIC HISTORICAL  05/19/2016   IR ANGIO INTRA EXTRACRAN SEL INTERNAL CAROTID UNI L MOD SED 05/19/2016 Luanne Bras, MD [facility]  . IR GENERIC HISTORICAL  05/19/2016   IR NEURO EACH ADD'L AFTER BASIC UNI LEFT (MS) 05/19/2016 Luanne Bras, MD [facility]  . IR GENERIC HISTORICAL  05/19/2016   IR ANGIOGRAM FOLLOW UP STUDY 05/19/2016 Luanne Bras, MD [facility]  . IR GENERIC HISTORICAL  05/19/2016   IR ANGIO VERTEBRAL SEL VERTEBRAL UNI L MOD SED 05/19/2016 Luanne Bras, MD [facility]  . IR GENERIC HISTORICAL  05/19/2016   IR TRANSCATH/EMBOLIZ 05/19/2016 Luanne Bras, MD [facility]  . IR GENERIC HISTORICAL  05/19/2016   IR 3D INDEPENDENT WKST 05/19/2016 Luanne Bras, MD [facility]  . left rotator cuff surgery  2000  . PERMANENT PACEMAKER INSERTION N/A 08/27/2011   Procedure: PERMANENT PACEMAKER INSERTION;  Surgeon: Evans Lance, MD;  Location: [facility] CATH LAB;  Service: Cardiovascular;  Laterality: N/A;  . PUNCH BIOPSY OF SKIN  08/2009   5 mm punch biopsy on upper mid back melanotic appearing lesion  . RADIOLOGY WITH ANESTHESIA N/A 05/19/2016   Procedure: Embolization;  Surgeon: Luanne Bras, MD;  Location: [facility];  Service: Radiology;  Laterality: N/A;   Family History  Problem Relation Age of Onset  . Other Father 35    [place]  . Heart attack Mother 60  . Heart disease Mother     ASHD  . COPD Brother   . Hypertension Brother   . Heart disease Brother     ASHD  . Heart disease Brother     ASHD  . Heart disease Sister     ASHD  . COPD Brother   . Coronary artery disease     Social History  Substance Use Topics  . Smoking status: Former  Smoker    Packs/day: 0.50    Years: 15.00    Types: Cigarettes    Quit date: 09/13/1965  . Smokeless tobacco: Never Used  . Alcohol use No    Review of Systems  Allergies  Codeine; Penicillins; and Zyrtec [cetirizine hcl]  Home Medications   Prior to Admission medications   Medication Sig Start Date End Date Taking? Authorizing Provider  apixaban (ELIQUIS) 2.5 MG TABS tablet Take 1 tablet (2.5 mg total) by mouth 2 (two) times daily. 04/10/15  Yes Rowe Clack, MD  aspirin EC 81 MG tablet  Take 81 mg by mouth daily.   Yes Historical Provider, MD  diltiazem (CARTIA XT) 240 MG 24 hr capsule Take 240 mg by mouth daily.   Yes Historical Provider, MD  prednisoLONE acetate (PRED FORTE) 1 % ophthalmic suspension Place 1 drop into both eyes 4 (four) times daily as needed (for dry eyes).  01/20/16  Yes Historical Provider, MD  RANEXA 500 MG 12 hr tablet TAKE 1 TABLET TWICE A DAY Patient taking differently: Take 500 mg by mouth twice daily 04/05/16  Yes Belva Crome, MD  simvastatin (ZOCOR) 20 MG tablet Take 1 tablet (20 mg total) by mouth every evening. 05/03/16  Yes Binnie Rail, MD  tamsulosin (FLOMAX) 0.4 MG CAPS capsule Take 0.4 mg by mouth daily.   Yes Historical Provider, MD  valsartan (DIOVAN) 320 MG tablet TAKE ONE-HALF (1/2) TABLET DAILY Patient taking differently: Take 160 mg by mouth daily 12/15/15  Yes Binnie Rail, MD  acetaminophen (TYLENOL) 500 MG tablet Take 1 tablet (500 mg total) by mouth daily as needed for headache. 05/12/14   Delos Haring, PA-C  meclizine (ANTIVERT) 25 MG tablet Take 1 tablet (25 mg total) by mouth 3 (three) times daily as needed for dizziness. 03/17/16   Biagio Borg, MD  nitroGLYCERIN (NITROSTAT) 0.4 MG SL tablet PLACE 1 TABLET UNDER THE TONGUE EVERY 5 MINUTES AS NEEDED FOR CHEST PAIN Patient taking differently: PLACE 0.4 MG UNDER THE TONGUE EVERY 5 MINUTES AS NEEDED FOR CHEST PAIN 03/08/16   Evans Lance, MD  ondansetron (ZOFRAN ODT) 4 MG disintegrating  tablet Take 1 tablet (4 mg total) by mouth every 8 (eight) hours as needed for nausea or vomiting. 05/21/16   Binnie Rail, MD  ticagrelor (BRILINTA) 90 MG TABS tablet Take 45 mg by mouth 2 (two) times daily.     Historical Provider, MD   Meds Ordered and Administered this Visit  Medications - No data to display  BP 114/71 (BP Location: Left Arm)   Pulse 64   Temp 97 F (36.1 C) (Oral)   Resp 12   SpO2 97%  No data found.   Physical Exam  Urgent Care Course   Clinical Course    Procedures (including critical care time)  Labs Review Labs Reviewed - No data to display  Imaging Review No results found.   Visual Acuity Review  Right Eye Distance:   Left Eye Distance:   Bilateral Distance:    Right Eye Near:   Left Eye Near:    Bilateral Near:         MDM   1. Right arm pain        Jacqualine Mau, NP 05/29/16 1358

## 2016-05-29 NOTE — ED Notes (Signed)
Pin pad not working, pt willing to sign

## 2016-05-29 NOTE — Discharge Instructions (Signed)
Apply warm compress to affected area.  Tylenol or Tramadol for pain.  Continue medications at current dosages.

## 2016-05-29 NOTE — Progress Notes (Signed)
VASCULAR LAB PRELIMINARY  PRELIMINARY  PRELIMINARY  PRELIMINARY  Right upper extremity venous duplex completed.    Preliminary report:  There is no DVT or SVT noted in the right upper extremity.  There is a hematoma noted in the forearm and at the [facility].  Zeno Hickel, RVT 05/29/2016, 4:46 PM

## 2016-05-29 NOTE — Discharge Instructions (Signed)
Patient will be sent to the ED for venous US.

## 2016-05-29 NOTE — ED Triage Notes (Signed)
Pt sent to ED from [facility] for complaint of right upper arm pain X4 days. Bruising noted to the site. Pt here to have ultrasound to rule out DVT.

## 2016-05-29 NOTE — ED Notes (Signed)
Pt d/c by Jennifer O, NP  

## 2016-05-31 ENCOUNTER — Telehealth (HOSPITAL_COMMUNITY): Payer: Self-pay | Admitting: *Deleted

## 2016-05-31 NOTE — Telephone Encounter (Signed)
Returned call to Shaun James.  Per Dr. Estanislado Pandy James is to be taking Brilinta 45mg  BID and ASA 81mg  daily.  He is not to take Eloquis.  She repeated instructions to me.

## 2016-06-02 ENCOUNTER — Encounter: Payer: Self-pay | Admitting: *Deleted

## 2016-06-03 ENCOUNTER — Ambulatory Visit (HOSPITAL_COMMUNITY)
Admission: RE | Admit: 2016-06-03 | Discharge: 2016-06-03 | Disposition: A | Discharge status: Home / Self Care | Payer: Medicare Other | Source: Ambulatory Visit | Attending: Interventional Radiology | Admitting: Interventional Radiology

## 2016-06-03 DIAGNOSIS — I671 Cerebral aneurysm, nonruptured: Secondary | ICD-10-CM

## 2016-06-03 HISTORY — PX: IR GENERIC HISTORICAL: IMG1180011

## 2016-06-07 ENCOUNTER — Encounter (HOSPITAL_COMMUNITY): Payer: Self-pay | Admitting: Interventional Radiology

## 2016-06-09 ENCOUNTER — Telehealth: Payer: Self-pay | Admitting: Interventional Cardiology

## 2016-06-09 ENCOUNTER — Encounter: Payer: Self-pay | Admitting: Interventional Cardiology

## 2016-06-09 ENCOUNTER — Ambulatory Visit (INDEPENDENT_AMBULATORY_CARE_PROVIDER_SITE_OTHER): Payer: Medicare Other | Admitting: Interventional Cardiology

## 2016-06-09 VITALS — BP 108/72 | HR 73 | Ht 63.0 in | Wt 132.8 lb

## 2016-06-09 DIAGNOSIS — I25718 Atherosclerosis of autologous vein coronary artery bypass graft(s) with other forms of angina pectoris: Secondary | ICD-10-CM

## 2016-06-09 DIAGNOSIS — E785 Hyperlipidemia, unspecified: Secondary | ICD-10-CM

## 2016-06-09 DIAGNOSIS — I484 Atypical atrial flutter: Secondary | ICD-10-CM

## 2016-06-09 DIAGNOSIS — Z950 Presence of cardiac pacemaker: Secondary | ICD-10-CM | POA: Diagnosis not present

## 2016-06-09 DIAGNOSIS — I671 Cerebral aneurysm, nonruptured: Secondary | ICD-10-CM

## 2016-06-09 DIAGNOSIS — I495 Sick sinus syndrome: Secondary | ICD-10-CM

## 2016-06-09 MED ORDER — APIXABAN 2.5 MG PO TABS: 2.5000 mg | ORAL_TABLET | Freq: Two times a day (BID) | ORAL | 11 refills | Status: DC

## 2016-06-09 NOTE — Telephone Encounter (Signed)
Dr.Smith has returned the call
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
## 2016-06-09 NOTE — Telephone Encounter (Signed)
New message      Pt c/o medication issue:  1. Name of Medication:brilinta, aspirin 2. How are you currently taking this medication (dosage and times per day)?  brilinta 45mg  bid, aspirin daily 3. Are you having a reaction (difficulty breathing--STAT)? no  4. What is your medication issue?  Pt is on brilinta 45mg  bid and aspirin daily.  Patient's daughter told Dr Estanislado Pandy that Dr Tamala Julian was going to put pt on eliquis and will stop his aspirin.  Calling to confirm with Dr Tamala Julian medication change

## 2016-06-09 NOTE — Patient Instructions (Signed)
Medication Instructions:  Your physician has recommended you make the following change in your medication:  1) RESUME Eliquis 2.5mg  daily. An Rx has been sent to your pharmacy  Continue all other medications as prescribed   Labwork: None ordered  Testing/Procedures: None ordered  Follow-Up: Your physician recommends that you schedule a follow-up appointment in: 3 months with Dr.Smith   Any Other Special Instructions Will Be Listed Below (If Applicable).     If you need a refill on your cardiac medications before your next appointment, please call your pharmacy.

## 2016-06-14 NOTE — Telephone Encounter (Signed)
Per Dr. Thompson Caul last office note:   1. After speaking with interventional radiology in reviewing the records, we will resume Eliquis 2.5 mg twice a day. We will also continue Brilinta 45 mg daily. Aspirin will be discontinued. Caution to look for evidence of bleeding is given to the patient and his daughter. He should go ahead and start rehabilitation process to improve gait and stability   Spoke with representative from [facility] and advised him of info from last office note.  He thanked me for my assistance.

## 2016-06-14 NOTE — Telephone Encounter (Signed)
New message      Calling to get clarification on presc for eliquis because pt also has a presc for brilinta.  Is he supposed to be on both medications?

## 2016-06-16 ENCOUNTER — Ambulatory Visit (INDEPENDENT_AMBULATORY_CARE_PROVIDER_SITE_OTHER): Payer: Medicare Other | Admitting: *Deleted

## 2016-06-16 ENCOUNTER — Encounter: Payer: Medicare Other | Admitting: *Deleted

## 2016-06-16 ENCOUNTER — Encounter: Payer: Self-pay | Admitting: Cardiology

## 2016-06-16 DIAGNOSIS — Z950 Presence of cardiac pacemaker: Secondary | ICD-10-CM | POA: Diagnosis not present

## 2016-06-16 DIAGNOSIS — I495 Sick sinus syndrome: Secondary | ICD-10-CM

## 2016-06-16 LAB — CUP PACEART INCLINIC DEVICE CHECK
Implantable Lead Location: 753860
Implantable Lead Model: 4135
Implantable Lead Model: 4136
Implantable Lead Serial Number: 29098858
Lead Channel Impedance Value: 810 Ohm
Lead Channel Pacing Threshold Amplitude: 0.5 V
Lead Channel Pacing Threshold Pulse Width: 0.4 ms
Lead Channel Sensing Intrinsic Amplitude: 23.1 mV
Lead Channel Sensing Intrinsic Amplitude: 3.7 mV
Lead Channel Setting Sensing Sensitivity: 2.5 mV
MDC IDC LEAD IMPLANT DT: 20121214
MDC IDC LEAD IMPLANT DT: 20121214
MDC IDC LEAD LOCATION: 753859
MDC IDC LEAD SERIAL: 29066260
MDC IDC MSMT LEADCHNL RA IMPEDANCE VALUE: 560 Ohm
MDC IDC MSMT LEADCHNL RA PACING THRESHOLD AMPLITUDE: 0.8 V
MDC IDC MSMT LEADCHNL RA PACING THRESHOLD PULSEWIDTH: 0.4 ms
MDC IDC PG SERIAL: 118262
MDC IDC SESS DTM: 20171004040000
MDC IDC SET LEADCHNL RA PACING AMPLITUDE: 2 V
MDC IDC SET LEADCHNL RV PACING AMPLITUDE: 1.1 V
MDC IDC SET LEADCHNL RV PACING PULSEWIDTH: 0.4 ms
MDC IDC STAT BRADY RA PERCENT PACED: 7 %
MDC IDC STAT BRADY RV PERCENT PACED: 11 %

## 2016-06-16 NOTE — Progress Notes (Signed)
Pacemaker check in clinic. Normal device function. Thresholds, sensing, impedances consistent with previous measurements. Device programmed to maximize longevity. No mode switch or high ventricular rates noted. Device programmed at appropriate safety margins. Histogram distribution appropriate for patient activity level. Device programmed to optimize intrinsic conduction. Estimated longevity 5.5 years. Patient enrolled in remote follow-up, will order cellular adapter. Patient education completed. Latitude NXT on 09/15/16 and ROV with GT in 10/2016.

## 2016-06-21 ENCOUNTER — Telehealth: Payer: Self-pay | Admitting: Interventional Cardiology

## 2016-06-21 NOTE — Telephone Encounter (Signed)
New Message  Pt c/o medication issue:  1. Name of Medication:  Eliquis Brillinta  2. How are you currently taking this medication (dosage and times per day)? (E)2.5 mg tabltets twice daily (B)90 mg tablets twice daily  3. Are you having a reaction (difficulty breathing--STAT)? Dizziness  4. What is your medication issue? Pts daughter voiced pt had brain aneurism three weeks ago and was taken off of eliquis.   Pts daughter voiced wanting MD Tamala Julian to know if he's on two blood thinners.   Please f/u

## 2016-06-21 NOTE — Telephone Encounter (Signed)
Spoke with daughter and she wanted to clarify that pt was suppose to be on both Brilinta and Eliquis.  Advised daughter that according to Dr. Thompson Caul last office note pt was suppose to be taking both.  Dr. Tamala Julian asked to speak with daughter as well and gave more information regarding both medications.  Daughter appreciative for all assistance.

## 2016-06-24 ENCOUNTER — Telehealth (HOSPITAL_COMMUNITY): Payer: Self-pay | Admitting: Radiology

## 2016-06-24 ENCOUNTER — Ambulatory Visit: Payer: Medicare Other | Attending: Internal Medicine

## 2016-06-24 DIAGNOSIS — R2689 Other abnormalities of gait and mobility: Secondary | ICD-10-CM | POA: Insufficient documentation

## 2016-06-24 DIAGNOSIS — R42 Dizziness and giddiness: Secondary | ICD-10-CM | POA: Diagnosis not present

## 2016-06-24 NOTE — Telephone Encounter (Signed)
Called pt's daughter Freda Munro). Told her that Dr. Estanislado Pandy would like for Mr. Rumbaugh to have a CT angio head/neck due to his continued sx of dizziness and vertigo. The daughter agrees to this plan. JM
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
## 2016-06-25 ENCOUNTER — Ambulatory Visit: Payer: Medicare Other | Admitting: Physical Therapy

## 2016-06-25 ENCOUNTER — Telehealth (HOSPITAL_COMMUNITY): Payer: Self-pay

## 2016-06-25 ENCOUNTER — Encounter: Payer: Self-pay | Admitting: Physical Therapy

## 2016-06-25 ENCOUNTER — Other Ambulatory Visit (HOSPITAL_COMMUNITY): Payer: Self-pay | Admitting: Interventional Radiology

## 2016-06-25 DIAGNOSIS — I729 Aneurysm of unspecified site: Secondary | ICD-10-CM

## 2016-06-25 DIAGNOSIS — R2689 Other abnormalities of gait and mobility: Secondary | ICD-10-CM

## 2016-06-25 DIAGNOSIS — R42 Dizziness and giddiness: Secondary | ICD-10-CM

## 2016-06-25 NOTE — Patient Instructions (Addendum)
Perform these in a corner with a chair in front of you for safety, use the walls and chair as you need too for balance.  Feet Apart (Compliant Surface) Varied Arm Positions - Eyes Closed    Stand on compliant surface: standing on pillow/s with feet shoulder width apart and arms as needed for balance. Close eyes and visualize upright position. Hold_30__ seconds. Repeat _3__ times per session. Do __1-2__ sessions per day.  Copyright  VHI. All rights reserved.   Feet Apart (Compliant Surface) Head Motion - Eyes Open    With eyes open, standing on compliant surface: pillow/s, feet shoulder width apart, move head slowly: 1. up and down x 10 repetitions 2. Left and right x 10 repetitions  Do _1-2___ sessions per day.  Copyright  VHI. All rights reserved.    Weight Shift: Anterior / Posterior (Righting / Equilibrium)    1. Move your hips forward as your arms move backwards until your heels rise off the floor. 2. Then move your hips backwards as your arms move forwards until you toes rise off the floor.  Repeat __10__ times each way. Do __1-2__ sessions per day.  Copyright  VHI. All rights reserved.

## 2016-06-25 NOTE — Telephone Encounter (Signed)
Called to schedule CT scan, left message for pt to call back. AW

## 2016-06-28 ENCOUNTER — Ambulatory Visit: Payer: Medicare Other | Admitting: Nurse Practitioner

## 2016-06-28 NOTE — Therapy (Signed)
[facility] [address] [place], [place], [address] Phone: [phone]   Fax:  [phone]  Physical Therapy Treatment  Patient Details  Name: QUINSHAWN SHONG MRN: [id number] Date of Birth: June 13, 1931 Referring Provider: Dr. Quay Burow  Encounter Date: 06/25/2016   06/25/16 1237  PT Visits / Re-Eval  Visit Number 2  Number of Visits 9  Date for PT Re-Evaluation 07/24/16  Authorization  Authorization Type [employer] Medicare: G-CODE AND PROGRESS NOTE EVERY 10TH VISIT.   PT Time Calculation  PT Start Time 1232  PT Stop Time 1315  PT Time Calculation (min) 43 min  PT - End of Session  Equipment Utilized During Treatment (min guard and S prn)  Activity Tolerance Patient tolerated treatment well  Behavior During Therapy WFL for tasks assessed/performed     Past Medical History:  Diagnosis Date  . AAA (abdominal aortic aneurysm) (HCC)    3.1 cm AAA, reevaluated 08/2009 per ultrasound - stable  . Allergic rhinitis   . Aortic sclerosis    Probable AS on physical exam, 2010  . Aortic stenosis    mild AS 2014 echo  . Arthritis   . Atrial fibrillation (HCC)    chronic anticoag  . Atrial flutter ([place])   . Basal cell carcinoma   . Benign positional vertigo   . BPH (benign prostatic hyperplasia)   . CAD in native artery    a) s/p CABG '98; LIMA-LAD, SVG-D1, SVG-OM1, SVG-PDA); b) CATH -2009: midLAD & RCA 100%, OM2 100% wtih severe native Cx; Grafts patent with ~30-50% SVG-D1 & ~30-40% SVG-OM, EF 45%; c) Lexiscan Cardiolite 02/2014: EF 61%, No Ischemia; subtle fixed anteroseptal defect.  . Cardiac pacemaker in [place]  . Coronary atherosclerosis of native coronary artery   . Diverticulosis of colon (without mention of hemorrhage)   . DJD (degenerative joint disease)   . Esophageal reflux   . Essential hypertension, benign   . GERD (gastroesophageal reflux disease)   . Headache    after brain aneurysm  . History  of tuberculosis    remote Hx  . Inflamed seborrheic keratosis   . Intermittent confusion   . Internal hemorrhoids    severe  . Mixed hyperlipidemia   . Mouth burn   . Myocardial infarction   . Near syncope    uncertain cause. R/O arrhythmia, R/O med effect  . Osteoarthritis 06/14/2012  . Peptic ulcer, unspecified site, unspecified as acute or chronic, without mention of hemorrhage, perforation, or obstruction   . Personal history of colonic polyps   . Pneumonia   . Presbycusis of both ears    Bilateral hearing aids  . Presence of permanent cardiac pacemaker   . Prostatitis dx 12/2102   E coli Ucx  . SSS (sick sinus syndrome) (HCC)    syncope, s/p ppm 12/12  . Systolic murmur    Worrisome for AS. AS could cause exertional fatigue.    Past Surgical History:  Procedure Laterality Date  . CARDIAC CATHETERIZATION  2002  . cataracts     bilateral  . COLONOSCOPY    . CORONARY ARTERY BYPASS GRAFT  1997   (LIMA to LAD, SVG to diagonal-50% closed on catheterization in 1999, SVG to OM1, SVG to PDA) Repeat cath 2009 with patent grafts  . EXCISION OF TONGUE LESION    . EYE SURGERY Bilateral    cataract surgery with lens implant  . HEMORRHOID SURGERY  07/31/2010  . INSERT / REPLACE / REMOVE  PACEMAKER  08/27/2011   PPM implant  . IR GENERIC HISTORICAL  04/06/2016   IR ANGIO VERTEBRAL SEL SUBCLAVIAN INNOMINATE UNI L MOD SED 04/06/2016 Luanne Bras, MD [facility]  . IR GENERIC HISTORICAL  04/06/2016   IR ANGIO VERTEBRAL SEL VERTEBRAL UNI R MOD SED 04/06/2016 Luanne Bras, MD [facility]  . IR GENERIC HISTORICAL  04/06/2016   IR ANGIO INTRA EXTRACRAN SEL COM CAROTID INNOMINATE BILAT MOD SED 04/06/2016 Luanne Bras, MD [facility]  . IR GENERIC HISTORICAL  05/19/2016   IR ANGIO INTRA EXTRACRAN SEL INTERNAL CAROTID UNI L MOD SED 05/19/2016 Luanne Bras, MD [facility]  . IR GENERIC HISTORICAL  05/19/2016   IR NEURO EACH ADD'L AFTER BASIC UNI LEFT (MS) 05/19/2016 Luanne Bras, MD [facility]  . IR GENERIC HISTORICAL  05/19/2016   IR ANGIOGRAM FOLLOW UP STUDY 05/19/2016 Luanne Bras, MD [facility]  . IR GENERIC HISTORICAL  05/19/2016   IR ANGIO VERTEBRAL SEL VERTEBRAL UNI L MOD SED 05/19/2016 Luanne Bras, MD [facility]  . IR GENERIC HISTORICAL  05/19/2016   IR TRANSCATH/EMBOLIZ 05/19/2016 Luanne Bras, MD [facility]  . IR GENERIC HISTORICAL  05/19/2016   IR 3D INDEPENDENT WKST 05/19/2016 Luanne Bras, MD [facility]  . IR GENERIC HISTORICAL  06/03/2016   IR RADIOLOGIST EVAL & MGMT 06/03/2016 [facility]  . left rotator cuff surgery  2000  . PERMANENT PACEMAKER INSERTION N/A 08/27/2011   Procedure: PERMANENT PACEMAKER INSERTION;  Surgeon: Evans Lance, MD;  Location: [facility] CATH LAB;  Service: Cardiovascular;  Laterality: N/A;  . PUNCH BIOPSY OF SKIN  08/2009   5 mm punch biopsy on upper mid back melanotic appearing lesion  . RADIOLOGY WITH ANESTHESIA N/A 05/19/2016   Procedure: Embolization;  Surgeon: Luanne Bras, MD;  Location: [facility];  Service: Radiology;  Laterality: N/A;    There were no vitals filed for this visit.     06/25/16 1236  Symptoms/Limitations  Subjective No new complaints. No falls or pain to report. States the dizziness may be a little better "I can't really tell".  Pertinent History Hx AAA, HTN, MI in 1989 and s/p CABG, arthritis, hx of BPPV, HOH, CAD, a-fib with pacemaker placed, SSS, (from MD note-05/2016) S/p lt common carotid arteriogram, and Lt VA angiogram followed by placement of LI MCA/ICA flow diverter for wide necked supraclinoid aneurysm,and intra device angioplasty for high grade stenosis with approx 50 % stenosis post angioplasty   Patient Stated Goals Stop the dizziness   treatment: Neuro re-ed: sensory organization test performed with following results: Conditions: 1:  All above norm 2:  All above norm 3:  2 below norm, above norm on last  4: all falls 5: all falls 6: all falls Composite  score:  29 Sensory Analysis Som: above norm  Vis: at 0% Vest: at 0% Pref:above norm Strategy analysis:  Ankle dom    COG alignment: right posterior       Issued the following to HEP today:   Perform these in a corner with a chair in front of you for safety, use the walls and chair as you need too for balance.  Feet Apart (Compliant Surface) Varied Arm Positions - Eyes Closed    Stand on compliant surface: standing on pillow/s with feet shoulder width apart and arms as needed for balance. Close eyes and visualize upright position. Hold_30__ seconds. Repeat _3__ times per session. Do __1-2__ sessions per day.  Copyright  VHI. All rights reserved.   Feet Apart (  Compliant Surface) Head Motion - Eyes Open    With eyes open, standing on compliant surface: pillow/s, feet shoulder width apart, move head slowly: 1. up and down x 10 repetitions 2. Left and right x 10 repetitions  Do _1-2___ sessions per day.  Copyright  VHI. All rights reserved.    Weight Shift: Anterior / Posterior (Righting / Equilibrium)    1. Move your hips forward as your arms move backwards until your heels rise off the floor. 2. Then move your hips backwards as your arms move forwards until you toes rise off the floor.  Repeat __10__ times each way. Do __1-2__ sessions per day.  Copyright  VHI. All rights reserved.       06/25/16 1309  PT Education  Education provided Yes  Education Details SOT results and HEP (corner balance activities)  Person(s) Educated Patient  Methods Explanation;Demonstration;Verbal cues;Handout  Comprehension Verbalized understanding;Returned demonstration;Need further instruction;Verbal cues required           PT Short Term Goals - 06/24/16 0905      PT SHORT TERM GOAL #1   Title same as LTGs           PT Long Term Goals - 06/24/16 0905      PT LONG TERM GOAL #1   Title Pt will be IND in HEP to improve balance and decr. dizziness. TARGET DATE FOR ALL  LTGS: 07/22/16   Status New     PT LONG TERM GOAL #2   Title Pt will improve FGA score to >/=26/30 to decr. falls risk.    Status New     PT LONG TERM GOAL #3   Title Perform SOT and write goal as indicated.    Status New     PT LONG TERM GOAL #4   Title Pt will amb. 700' over even/paved surfaces, IND, while performing head turns without dizziness incr. more than 1 point to improve functional mobility,    Status New     PT LONG TERM GOAL #5   Title Pt will improve DHI score from 34% to 16% to improve quality of life.    Status New        06/25/16 1237  Plan  Clinical Impression Statement During today's skilled session SOT was performed to establish base line values with testing showing pt has decreased visual and vestibular system imputs for balance. Primary PT to set goals based on results of today's session. HEP issued today to address these areas. Pt is making steady progress toward goals and should benefit form continued PT to progress toward unmet goals.                     Pt will benefit from skilled therapeutic intervention in order to improve on the following deficits Abnormal gait;Decreased endurance;Decreased balance;Decreased mobility;Dizziness;Postural dysfunction  Rehab Potential Good  Clinical Impairments Affecting Rehab Potential Hx AAA, HTN, MI in 1989 and s/p CABG, arthritis, hx of BPPV, HOH, CAD, a-fib with pacemaker placed, SSS, (from MD note-05/2016) S/p lt common carotid arteriogram, and Lt VA angiogram followed by placement of LI MCA/ICA flow diverter for wide necked supraclinoid aneurysm,and intra device angioplasty for high grade stenosis with approx 50 % stenosis post angioplasty   PT Frequency 2x / week  PT Duration 4 weeks  PT Treatment/Interventions ADLs/Self Care Home Management;Biofeedback;Canalith Repostioning;Neuromuscular re-education;Balance training;Therapeutic exercise;Therapeutic activities;Functional mobility training;Stair training;Gait training;DME  Instruction;Patient/family education;Vestibular;Manual techniques  PT Next Visit Plan review balance HEP and initiate VOR HEP.  Avoid extreme cervical extension 2/2 pt's hx.   Consulted and Agree with Plan of Care Patient     Patient will benefit from skilled therapeutic intervention in order to improve the following deficits and impairments:  Abnormal gait, Decreased endurance, Decreased balance, Decreased mobility, Dizziness, Postural dysfunction  Visit Diagnosis: Dizziness and giddiness  Other abnormalities of gait and mobility     Problem List Patient Active Problem List   Diagnosis Date Noted  . Phlebitis 05/28/2016  . Brain aneurysm 05/19/2016  . AAA (abdominal aortic aneurysm) without rupture ([place]) 04/25/2016  . Pulmonary nodule 04/25/2016  . Bilateral hearing loss 03/22/2016  . Aneurysm, cerebral, nonruptured 03/11/2016  . Carotid artery stenosis 03/11/2016  . Vertigo 03/10/2016  . BPPV (benign paroxysmal positional vertigo) 03/10/2016  . Ataxia   . Right elbow pain 01/28/2016  . Rhinorrhea 08/12/2015  . Burning tongue 08/12/2015  . GERD (gastroesophageal reflux disease) 08/12/2015  . BPH without urinary obstruction 04/10/2015  . 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 Mucopurulent chronic bronchitis ([place]) 07/17/2014  . Insomnia 05/22/2014  . Chronic headache   . Osteoarthritis   . Pacemaker 10/18/2011  . Atrial flutter / Atrial Fibrillation 09/13/2011  . CAD (coronary artery disease), autologous vein bypass graft 09/13/2011  . Sick sinus syndrome ([place]) 09/13/2011  . Hyperlipidemia with target LDL less than 70 04/16/2008  . Acute myocardial infarction 04/16/2008  . Pulmonary fibrosis ([place]) 04/16/2008    Willow Ora, PTA, [facility] [address], [place], [place] [address] [phone] 06/28/16, 10:30 AM   Name: DERRELL KAUPP MRN: [id number] Date of Birth: [birth year]

## 2016-06-29 ENCOUNTER — Encounter: Payer: Self-pay | Admitting: Internal Medicine

## 2016-06-29 ENCOUNTER — Ambulatory Visit (INDEPENDENT_AMBULATORY_CARE_PROVIDER_SITE_OTHER): Payer: Medicare Other | Admitting: Internal Medicine

## 2016-06-29 VITALS — BP 134/72 | HR 63 | Temp 97.5°F | Resp 16 | Wt 134.0 lb

## 2016-06-29 DIAGNOSIS — M7989 Other specified soft tissue disorders: Secondary | ICD-10-CM | POA: Diagnosis not present

## 2016-06-29 NOTE — Patient Instructions (Signed)
Monitor your symptoms in the right arm.  If they occur again please let me know.   Continue your current medications.

## 2016-06-29 NOTE — Progress Notes (Signed)
Pre visit review using our clinic review tool, if applicable. No additional management support is needed unless otherwise documented below in the visit note. 

## 2016-06-29 NOTE — Assessment & Plan Note (Signed)
Subjective tightness or swelling of right lower arm and hand No visible swelling, no other symptoms including pain, numbness/tingling or rash Symptoms resolved Not sure of cause Will monitor for now

## 2016-06-29 NOTE — Progress Notes (Signed)
Subjective:    Patient ID: Shaun James, male    DOB: Jun 06, 1931, 82 y.o.   MRN: [id number]  HPI He is here for an acute visit.   Right arm tightness:  He has had three episodes of a feeling of tightness in his right arm over the past 6 days.  He has not had any episodes over the past couple of days.  He first felt tightness in his right hand and it progressed up his forearm.  He denies any visible swelling, redness or skin changes, pain or numbness/tingling.  The symptoms went away within 30 minutes.  Massaging the hand and arm may have helped.  He is taking his medications daily as prescribed.  He was worried about having a stroke.   He denies any arm/leg weakness/numbness or tingling. He denies regular headaches.  Since his aneurysm repair he did have transient left posterior head pain.   Medications and allergies reviewed with patient and updated if appropriate.  Patient Active Problem List   Diagnosis Date Noted  . Phlebitis 05/28/2016  . Brain aneurysm 05/19/2016  . AAA (abdominal aortic aneurysm) without rupture ([place]) 04/25/2016  . Pulmonary nodule 04/25/2016  . Bilateral hearing loss 03/22/2016  . Aneurysm, cerebral, nonruptured 03/11/2016  . Carotid artery stenosis 03/11/2016  . Vertigo 03/10/2016  . BPPV (benign paroxysmal positional vertigo) 03/10/2016  . Ataxia   . Right elbow pain 01/28/2016  . Rhinorrhea 08/12/2015  . Burning tongue 08/12/2015  . GERD (gastroesophageal reflux disease) 08/12/2015  . BPH without urinary obstruction 04/10/2015  . 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 Mucopurulent chronic bronchitis ([place]) 07/17/2014  . Insomnia 05/22/2014  . Chronic headache   . Osteoarthritis   . Pacemaker 10/18/2011  . Atrial flutter / Atrial Fibrillation 09/13/2011  . CAD (coronary artery disease), autologous vein bypass graft 09/13/2011  . Sick sinus syndrome ([place]) 09/13/2011  . Hyperlipidemia with target LDL less than 70 04/16/2008  . Acute myocardial  infarction 04/16/2008  . Pulmonary fibrosis ([place]) 04/16/2008    Current Outpatient Prescriptions on File Prior to Visit  Medication Sig Dispense Refill  . acetaminophen (TYLENOL) 500 MG tablet Take 1 tablet (500 mg total) by mouth daily as needed for headache. 14 tablet 0  . apixaban (ELIQUIS) 2.5 MG TABS tablet Take 1 tablet (2.5 mg total) by mouth 2 (two) times daily. 60 tablet 11  . diltiazem (CARDIZEM CD) 120 MG 24 hr capsule Take 120 mg by mouth daily.    . meclizine (ANTIVERT) 25 MG tablet Take 1 tablet (25 mg total) by mouth 3 (three) times daily as needed for dizziness. 50 tablet 1  . nitroGLYCERIN (NITROSTAT) 0.4 MG SL tablet Place 0.4 mg under the tongue every 5 (five) minutes as needed for chest pain.    Marland Kitchen ondansetron (ZOFRAN ODT) 4 MG disintegrating tablet Take 1 tablet (4 mg total) by mouth every 8 (eight) hours as needed for nausea or vomiting. 20 tablet 0  . prednisoLONE acetate (PRED FORTE) 1 % ophthalmic suspension Place 1 drop into both eyes 4 (four) times daily as needed (for dry eyes).   0  . ranolazine (RANEXA) 500 MG 12 hr tablet Take 500 mg by mouth 2 (two) times daily.    . simvastatin (ZOCOR) 20 MG tablet Take 1 tablet (20 mg total) by mouth every evening. 90 tablet 2  . tamsulosin (FLOMAX) 0.4 MG CAPS capsule Take 0.4 mg by mouth daily.    . ticagrelor (BRILINTA) 90 MG TABS tablet  Take 45 mg by mouth 2 (two) times daily.     . valsartan (DIOVAN) 320 MG tablet Take 160 mg by mouth daily.     No current facility-administered medications on file prior to visit.     Past Medical History:  Diagnosis Date  . AAA (abdominal aortic aneurysm) (HCC)    3.1 cm AAA, reevaluated 08/2009 per ultrasound - stable  . Allergic rhinitis   . Aortic sclerosis    Probable AS on physical exam, 2010  . Aortic stenosis    mild AS 2014 echo  . Arthritis   . Atrial fibrillation (HCC)    chronic anticoag  . Atrial flutter ([place])   . Basal cell carcinoma   . Benign positional vertigo    . BPH (benign prostatic hyperplasia)   . CAD in native artery    a) s/p CABG '98; LIMA-LAD, SVG-D1, SVG-OM1, SVG-PDA); b) CATH -2009: midLAD & RCA 100%, OM2 100% wtih severe native Cx; Grafts patent with ~30-50% SVG-D1 & ~30-40% SVG-OM, EF 45%; c) Lexiscan Cardiolite 02/2014: EF 61%, No Ischemia; subtle fixed anteroseptal defect.  . Cardiac pacemaker in [place]  . Coronary atherosclerosis of native coronary artery   . Diverticulosis of colon (without mention of hemorrhage)   . DJD (degenerative joint disease)   . Esophageal reflux   . Essential hypertension, benign   . GERD (gastroesophageal reflux disease)   . Headache    after brain aneurysm  . History of tuberculosis    remote Hx  . Inflamed seborrheic keratosis   . Intermittent confusion   . Internal hemorrhoids    severe  . Mixed hyperlipidemia   . Mouth burn   . Myocardial infarction   . Near syncope    uncertain cause. R/O arrhythmia, R/O med effect  . Osteoarthritis 06/14/2012  . Peptic ulcer, unspecified site, unspecified as acute or chronic, without mention of hemorrhage, perforation, or obstruction   . Personal history of colonic polyps   . Pneumonia   . Presbycusis of both ears    Bilateral hearing aids  . Presence of permanent cardiac pacemaker   . Prostatitis dx 12/2102   E coli Ucx  . SSS (sick sinus syndrome) (HCC)    syncope, s/p ppm 12/12  . Systolic murmur    Worrisome for AS. AS could cause exertional fatigue.    Past Surgical History:  Procedure Laterality Date  . CARDIAC CATHETERIZATION  2002  . cataracts     bilateral  . COLONOSCOPY    . CORONARY ARTERY BYPASS GRAFT  1997   (LIMA to LAD, SVG to diagonal-50% closed on catheterization in 1999, SVG to OM1, SVG to PDA) Repeat cath 2009 with patent grafts  . EXCISION OF TONGUE LESION    . EYE SURGERY Bilateral    cataract surgery with lens implant  . HEMORRHOID SURGERY  07/31/2010  . INSERT / REPLACE / REMOVE PACEMAKER  08/27/2011     PPM implant  . IR GENERIC HISTORICAL  04/06/2016   IR ANGIO VERTEBRAL SEL SUBCLAVIAN INNOMINATE UNI L MOD SED 04/06/2016 Luanne Bras, MD [facility]  . IR GENERIC HISTORICAL  04/06/2016   IR ANGIO VERTEBRAL SEL VERTEBRAL UNI R MOD SED 04/06/2016 Luanne Bras, MD [facility]  . IR GENERIC HISTORICAL  04/06/2016   IR ANGIO INTRA EXTRACRAN SEL COM CAROTID INNOMINATE BILAT MOD SED 04/06/2016 Luanne Bras, MD [facility]  . IR GENERIC HISTORICAL  05/19/2016   IR ANGIO INTRA EXTRACRAN SEL INTERNAL CAROTID  UNI L MOD SED 05/19/2016 Luanne Bras, MD [facility]  . IR GENERIC HISTORICAL  05/19/2016   IR NEURO EACH ADD'L AFTER BASIC UNI LEFT (MS) 05/19/2016 Luanne Bras, MD [facility]  . IR GENERIC HISTORICAL  05/19/2016   IR ANGIOGRAM FOLLOW UP STUDY 05/19/2016 Luanne Bras, MD [facility]  . IR GENERIC HISTORICAL  05/19/2016   IR ANGIO VERTEBRAL SEL VERTEBRAL UNI L MOD SED 05/19/2016 Luanne Bras, MD [facility]  . IR GENERIC HISTORICAL  05/19/2016   IR TRANSCATH/EMBOLIZ 05/19/2016 Luanne Bras, MD [facility]  . IR GENERIC HISTORICAL  05/19/2016   IR 3D INDEPENDENT WKST 05/19/2016 Luanne Bras, MD [facility]  . IR GENERIC HISTORICAL  06/03/2016   IR RADIOLOGIST EVAL & MGMT 06/03/2016 [facility]  . left rotator cuff surgery  2000  . PERMANENT PACEMAKER INSERTION N/A 08/27/2011   Procedure: PERMANENT PACEMAKER INSERTION;  Surgeon: Evans Lance, MD;  Location: [facility] CATH LAB;  Service: Cardiovascular;  Laterality: N/A;  . PUNCH BIOPSY OF SKIN  08/2009   5 mm punch biopsy on upper mid back melanotic appearing lesion  . RADIOLOGY WITH ANESTHESIA N/A 05/19/2016   Procedure: Embolization;  Surgeon: Luanne Bras, MD;  Location: [facility];  Service: Radiology;  Laterality: N/A;    Social History   Social History  . Marital status: Married    Spouse name: N/A  . Number of children: 4  . Years of education: N/A   Occupational History  . retired     retired  Art gallery manager   Social History Main Topics  . Smoking status: Former Smoker    Packs/day: 0.50    Years: 15.00    Types: Cigarettes    Quit date: 09/13/1965  . Smokeless tobacco: Never Used  . Alcohol use No  . Drug use: No  . Sexual activity: Not Currently   Other Topics Concern  . Not on file   Social History Narrative   Moved from [place] in [place] alone; lives in 2 story home but stays downstairs   3 children / 1 other;     Family History  Problem Relation Age of Onset  . Other Father 47    [place]  . Heart attack Mother 17  . Heart disease Mother     ASHD  . COPD Brother   . Hypertension Brother   . Heart disease Brother     ASHD  . Heart disease Brother     ASHD  . Heart disease Sister     ASHD  . COPD Brother   . Coronary artery disease      Review of Systems  Constitutional: Negative for fever.  HENT: Negative for trouble swallowing.   Musculoskeletal: Negative for myalgias.  Skin: Negative for color change and rash.  Neurological: Positive for headaches (left posterior head, lasted thirty seconds and went away). Negative for speech difficulty, weakness and numbness.       Objective:   Vitals:   06/29/16 1627  BP: 134/72  Pulse: 63  Resp: 16  Temp: 97.5 F (36.4 C)   Filed Weights   06/29/16 1627  Weight: 134 lb (60.8 kg)   Body mass index is 23.74 kg/m.   Physical Exam  Constitutional: He appears well-developed and well-nourished. No distress.  Cardiovascular: Intact distal pulses.   Musculoskeletal:  Right hand and arm appear normal, non-tender to palpable.  No swelling  Neurological:  Normal sensation and strength right UE  Skin: Skin is warm  and dry. No rash noted. He is not diaphoretic. No erythema.  Psychiatric: He has a normal mood and affect. His behavior is normal.          Assessment & Plan:   See Problem List for Assessment and Plan of chronic medical problems.

## 2016-06-30 ENCOUNTER — Ambulatory Visit: Payer: Medicare Other | Admitting: Physical Therapy

## 2016-06-30 ENCOUNTER — Encounter: Payer: Self-pay | Admitting: Physical Therapy

## 2016-06-30 DIAGNOSIS — R42 Dizziness and giddiness: Secondary | ICD-10-CM

## 2016-06-30 DIAGNOSIS — R2689 Other abnormalities of gait and mobility: Secondary | ICD-10-CM

## 2016-06-30 NOTE — Patient Instructions (Addendum)
Gaze Stabilization: Tip Card  1.Target must remain in focus, not blurry, and appear stationary while head is in motion. 2.Perform exercises with small head movements (45 to either side of midline). 3.Increase speed of head motion so long as target is in focus. 4.If you wear eyeglasses, be sure you can see target through lens (therapist will give specific instructions for bifocal / progressive lenses). 5.These exercises may provoke dizziness or nausea. Work through these symptoms. If too dizzy, slow head movement slightly. Rest between each exercise. 6.Exercises demand concentration; avoid distractions. 7.For safety, perform standing exercises close to a counter, wall, corner, or next to someone.  Copyright  VHI. All rights reserved.   Gaze Stabilization: Standing Feet Apart    Feet shoulder width apart, keeping eyes on target on wall _8-10___ feet away 1. Tilt head down 15-30 and move head side to side for __20__ seconds. IF any symptoms occur keep eyes on target with no head movements until the symptoms go away.  2. Then repeat while moving head up and down for _20___ seconds. If symptoms occur keep eyes on target until symptoms go away.   Perform each of the head movements 3 times.  Do _1-2___ sessions per day.  Copyright  VHI. All rights reserved.    Oculomotor: Saccades     1. Holding cards as pictured in 1st one: side by side _~6-8___ inches apart, move eyes quickly from target to target for 15 seconds as head stays still. If any symptoms occur, fix your eyes on a stable/not moving target until symptoms go away.  2. Then have cards as pictured in second one for up and down: Move eyes from target to target keeping head still for 15 seconds. If any symptoms occur, fix your eyes on not moving target until symptoms go away.  3. Position cards now as in picture 3, diagonal directions. Move eyes form target to target for 15 seconds. If symptoms occur, fix eyes on non moving  target until the symptoms go away. Repeat this in the other diagonal direction for 15 seconds also. Fix gaze on non moving target if symptoms occur until they go away.   Perform sitting. Repeat _2-3___ times each position. Do __1-2__ sessions per day.  Copyright  VHI. All rights reserved.

## 2016-07-01 ENCOUNTER — Ambulatory Visit (HOSPITAL_COMMUNITY)
Admission: RE | Admit: 2016-07-01 | Discharge: 2016-07-01 | Disposition: A | Discharge status: Home / Self Care | Payer: Medicare Other | Source: Ambulatory Visit | Attending: Interventional Radiology | Admitting: Interventional Radiology

## 2016-07-01 ENCOUNTER — Encounter (HOSPITAL_COMMUNITY): Payer: Self-pay

## 2016-07-01 DIAGNOSIS — I729 Aneurysm of unspecified site: Secondary | ICD-10-CM

## 2016-07-01 DIAGNOSIS — R42 Dizziness and giddiness: Secondary | ICD-10-CM | POA: Diagnosis present

## 2016-07-01 DIAGNOSIS — I671 Cerebral aneurysm, nonruptured: Secondary | ICD-10-CM | POA: Insufficient documentation

## 2016-07-01 DIAGNOSIS — I6502 Occlusion and stenosis of left vertebral artery: Secondary | ICD-10-CM | POA: Insufficient documentation

## 2016-07-01 MED ORDER — IOPAMIDOL (ISOVUE-370) INJECTION 76%
INTRAVENOUS | Status: AC
  Administered 2016-07-01: 50 mL
  Filled 2016-07-01: qty 50
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
## 2016-07-02 ENCOUNTER — Ambulatory Visit: Payer: Medicare Other | Admitting: Physical Therapy

## 2016-07-02 ENCOUNTER — Telehealth: Payer: Self-pay | Admitting: Internal Medicine

## 2016-07-02 NOTE — Telephone Encounter (Signed)
Spoke with pts daughter to inform of MDs response.

## 2016-07-02 NOTE — Telephone Encounter (Signed)
This is secondary to the procedure he had with neurosurgery - she should call them.

## 2016-07-02 NOTE — Telephone Encounter (Signed)
Shaun James pt daughter called in said that he his whole arm is still busied and is still hurting.  What should he do.    Best number [phone]

## 2016-07-02 NOTE — Telephone Encounter (Signed)
Please advise 

## 2016-07-04 ENCOUNTER — Other Ambulatory Visit: Payer: Self-pay | Admitting: Interventional Cardiology

## 2016-07-06 ENCOUNTER — Other Ambulatory Visit (HOSPITAL_COMMUNITY): Payer: Self-pay | Admitting: Interventional Radiology

## 2016-07-06 ENCOUNTER — Ambulatory Visit: Payer: Medicare Other | Admitting: Physical Therapy

## 2016-07-06 DIAGNOSIS — R42 Dizziness and giddiness: Secondary | ICD-10-CM | POA: Diagnosis not present

## 2016-07-06 DIAGNOSIS — I729 Aneurysm of unspecified site: Secondary | ICD-10-CM

## 2016-07-06 DIAGNOSIS — R2689 Other abnormalities of gait and mobility: Secondary | ICD-10-CM

## 2016-07-06 NOTE — Patient Instructions (Addendum)
When performing exercises, stop frequently to assess symptoms. If symptoms of dizziness or nausea increase to more than 2 points (out of 10) above baseline, stop and rest until symptoms settle.   Feet Apart (Compliant Surface) Head Motion - Eyes Open    Stand with your back to a corner with a stable chair in front of you.   Perform the following while standing on 1 pillow with feet shoulder width apart 1. With eyes open, move head slowly: up and down 10 times; right to left 10 times. 2. With eyes closed, move head slowly: up and down 5 times; right to left 5 times. Do _1-2___ sessions per day.  Weight Shift: Anterior / Posterior (Righting / Equilibrium)    BEGIN WITH BACK LEANING AGAINST THE WALL AND FEET 4 INCHES AWAY. Feet shoulder width apart Move your hips off the wall to come to upright standing.  Hold for 5 seconds. Return slowly to the wall letting your hips touch the wall first, then shoulders. Hold for 5 seconds, then return to stand.   Hold each position __5__ seconds. Repeat _10_ times per session. Do _1-2__ sessions per day.   Gaze Stabilization: Tip Card  1.Target must remain in focus, not blurry, and appear stationary while head is in motion. 2.Perform exercises with small head movements (45 to either side of midline). 3.Increase speed of head motion so long as target is in focus. 4.If you wear eyeglasses, be sure you can see target through lens (therapist will give specific instructions for bifocal / progressive lenses). 5.These exercises may provoke dizziness or nausea. Work through these symptoms. If too dizzy, slow head movement slightly. Rest between each exercise. 6.Exercises demand concentration; avoid distractions. 7.For safety, perform standing exercises close to a counter, wall, corner, or next to someone.  Copyright  VHI. All rights reserved.   Gaze Stabilization: Standing Feet Apart    Feet shoulder width apart, keeping eyes on target on wall  _8-10___ feet away 1. Tilt head down 15-30 and move head side to side for __20__ seconds. IF any symptoms occur keep eyes on target with no head movements until the symptoms go away.  2. Then repeat while moving head up and down for _20___ seconds. If symptoms occur keep eyes on target until symptoms go away.   Perform each of the head movements 3 times.  Do _1-2___ sessions per day. Copyright  VHI. All rights reserved.
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
## 2016-07-09 ENCOUNTER — Ambulatory Visit: Payer: Medicare Other

## 2016-07-09 DIAGNOSIS — R2689 Other abnormalities of gait and mobility: Secondary | ICD-10-CM

## 2016-07-09 DIAGNOSIS — R42 Dizziness and giddiness: Secondary | ICD-10-CM | POA: Diagnosis not present
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
## 2016-07-09 NOTE — Patient Instructions (Signed)
When performing exercises, stop frequently to assess symptoms. If symptoms of dizziness or nausea increase to more than 2 points (out of 10) above baseline, stop and rest until symptoms settle.   Feet Apart (Compliant Surface) Head Motion - Eyes Open    Stand with your back to a corner with a stable chair in front of you.   Perform the following while standing on 1 pillow with feet shoulder width apart 1. With eyes open, move head slowly: up and down 10 times; right to left 10 times. 2. With eyes closed, move head slowly: up and down 5 times; right to left 5 times. Do _1-2___ sessions per day.  Weight Shift: Anterior / Posterior (Righting / Equilibrium)    BEGIN WITH BACK LEANING AGAINST THE WALL AND FEET 4 INCHES AWAY. Feet shoulder width apart Move your hips off the wall to come to upright standing.  Hold for 5 seconds. Return slowly to the wall letting your hips touch the wall first, then shoulders. Hold for 5 seconds, then return to stand.   Hold each position __5__ seconds. Repeat _10_ times per session. Do _1-2__ sessions per day.     Gaze Stabilization: Tip Card  1.Target must remain in focus, not blurry, and appear stationary while head is in motion. 2.Perform exercises with small head movements (45 to either side of midline). 3.Increase speed of head motion so long as target is in focus. 4.If you wear eyeglasses, be sure you can see target through lens (therapist will give specific instructions for bifocal / progressive lenses). 5.These exercises may provoke dizziness or nausea. Work through these symptoms. If too dizzy, slow head movement slightly. Rest between each exercise. 6.Exercises demand concentration; avoid distractions. 7.For safety, perform standing exercises close to a counter, wall, corner, or next to someone.  Copyright  VHI. All rights reserved.   Gaze Stabilization: Standing Feet Apart    Feet shoulder width apart, keeping eyes on target on wall  _8-10___ feet away 1. Tilt head down 15-30 and move head side to side for __20__ seconds. IF any symptoms occur keep eyes on target with no head movements until the symptoms go away.  2. Then repeat while moving head up and down for _20___ seconds. If symptoms occur keep eyes on target until symptoms go away.   Perform each of the head movements 3 times.  Do _1-2___ sessions per day.  Copyright  VHI. All rights reserved.    Oculomotor: Saccades     1. Holding cards as pictured in 1st one: side by side _~6-8___ inches apart, move eyes quickly from target to target for 15 seconds as head stays still. If any symptoms occur, fix your eyes on a stable/not moving target until symptoms go away.  2. Then have cards as pictured in second one for up and down: Move eyes from target to target keeping head still for 15 seconds. If any symptoms occur, fix your eyes on not moving target until symptoms go away.  3. Position cards now as in picture 3, diagonal directions. Move eyes form target to target for 15 seconds. If symptoms occur, fix eyes on non moving target until the symptoms go away. Repeat this in the other diagonal direction for 15 seconds also. Fix gaze on non moving target if symptoms occur until they go away.   Perform sitting. Repeat _2-3___ times each position. Do __1-2__ sessions per day.  Copyright  VHI. All rights reserved.   Perform these in a corner with a  chair in front of you for safety, use the walls and chair as you need too for balance.  Feet Apart (Compliant Surface) Varied Arm Positions - Eyes Closed    Stand on compliant surface: standing on pillow/s with feet shoulder width apart and arms as needed for balance. Close eyes and visualize upright position. Hold_30__ seconds. Repeat _3__ times per session. Do __1-2__ sessions per day.

## 2016-07-13 ENCOUNTER — Encounter: Payer: Self-pay | Admitting: Physical Therapy

## 2016-07-13 ENCOUNTER — Ambulatory Visit: Payer: Medicare Other | Admitting: Physical Therapy

## 2016-07-13 DIAGNOSIS — R42 Dizziness and giddiness: Secondary | ICD-10-CM

## 2016-07-13 DIAGNOSIS — R2689 Other abnormalities of gait and mobility: Secondary | ICD-10-CM
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
## 2016-07-15 ENCOUNTER — Other Ambulatory Visit (HOSPITAL_COMMUNITY): Payer: Self-pay | Admitting: Interventional Radiology

## 2016-07-15 ENCOUNTER — Ambulatory Visit: Payer: Medicare Other | Attending: Internal Medicine

## 2016-07-15 DIAGNOSIS — R42 Dizziness and giddiness: Secondary | ICD-10-CM | POA: Diagnosis present

## 2016-07-15 DIAGNOSIS — I729 Aneurysm of unspecified site: Secondary | ICD-10-CM

## 2016-07-15 DIAGNOSIS — R2689 Other abnormalities of gait and mobility: Secondary | ICD-10-CM | POA: Diagnosis not present
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
## 2016-07-15 NOTE — Patient Instructions (Addendum)
Side to Side Head Motion    Perform without assistive device. Walking on solid surface, turn head and eyes to left for __2__ steps. Then, turn head and eyes to opposite side for __2__ steps. Repeat sequence __4__ times per session. Do __1__ sessions per day.  Copyright  VHI. All rights reserved.  Up / Down Head Motion    Perform without assistive device. Walking on solid surface, move head and eyes toward ceiling for __2__ steps. Then, move head and eyes toward floor for __2__ steps. Repeat __4__ times per session. Do __1__ sessions per day.  Copyright  VHI. All rights reserved.

## 2016-07-20 ENCOUNTER — Ambulatory Visit (HOSPITAL_COMMUNITY)
Admission: RE | Admit: 2016-07-20 | Discharge: 2016-07-20 | Disposition: A | Discharge status: Home / Self Care | Payer: Medicare Other | Source: Ambulatory Visit | Attending: Interventional Radiology | Admitting: Interventional Radiology

## 2016-07-20 ENCOUNTER — Ambulatory Visit: Payer: Medicare Other | Admitting: Physical Therapy

## 2016-07-20 DIAGNOSIS — R42 Dizziness and giddiness: Secondary | ICD-10-CM

## 2016-07-20 DIAGNOSIS — I729 Aneurysm of unspecified site: Secondary | ICD-10-CM

## 2016-07-20 HISTORY — PX: IR GENERIC HISTORICAL: IMG1180011

## 2016-07-20 LAB — VAS US CAROTID
LEFT ECA DIAS: -13 cm/s
LEFT VERTEBRAL DIAS: -13 cm/s
Left CCA dist dias: -11 cm/s
Left CCA dist sys: -53 cm/s
Left CCA prox dias: -13 cm/s
Left CCA prox sys: -61 cm/s
Left ICA dist dias: -11 cm/s
Left ICA dist sys: -40 cm/s
Left ICA prox dias: -16 cm/s
Left ICA prox sys: -33 cm/s
RIGHT ECA DIAS: -9 cm/s
RIGHT VERTEBRAL DIAS: -20 cm/s
Right CCA prox dias: 21 cm/s
Right CCA prox sys: 63 cm/s
Right cca dist sys: -104 cm/s

## 2016-07-20 NOTE — Progress Notes (Addendum)
*  PRELIMINARY RESULTS* Vascular Ultrasound Carotid Duplex (Doppler) has been completed.  Preliminary findings: Findings consistent with 1- 39 percent stenosis involving the right internal carotid artery and the left internal carotid artery.  Can not rule out higher grade stenosis of left internal carotid artery due to large amount of calcified shadowing plaque. Bilateral vertebral arteries are patent and antegrade.  Myrtie Cruise Cherysh Epperly 07/20/2016, 10:05 AM

## 2016-07-21 ENCOUNTER — Encounter (HOSPITAL_COMMUNITY): Payer: Self-pay | Admitting: Interventional Radiology

## 2016-07-22 ENCOUNTER — Ambulatory Visit: Payer: Medicare Other | Admitting: Physical Therapy

## 2016-07-22 DIAGNOSIS — R2689 Other abnormalities of gait and mobility: Secondary | ICD-10-CM | POA: Diagnosis not present

## 2016-07-22 NOTE — Therapy (Signed)
[facility] [address] [place], [place], [address] Phone: [phone]   Fax:  [phone]  Patient Details  Name: Shaun James MRN: [id number] Date of Birth: 1931/05/10 Referring Provider:  No ref. provider found  Encounter Date: 08-10-16  PHYSICAL THERAPY DISCHARGE SUMMARY  Visits from Start of Care: 6  Current functional level related to goals / functional outcomes:     PT Long Term Goals - 07/15/16 0933      PT LONG TERM GOAL #1   Title Pt will be IND in HEP to improve balance and decr. dizziness. TARGET DATE FOR ALL LTGS: August 10, 2016   Status New     PT LONG TERM GOAL #2   Title Pt will improve FGA score to >/=26/30 to decr. falls risk.    Status New     PT LONG TERM GOAL #3   Title Pt will improve SOT composite score to WNL for his age group to improve balance and safety.   Status Revised     PT LONG TERM GOAL #4   Title Pt will amb. 700' over even/paved surfaces, IND, while performing head turns without dizziness incr. more than 1 point to improve functional mobility,    Status New     PT LONG TERM GOAL #5   Title Pt will improve DHI score from 34% to 16% to improve quality of life.    Status New        Remaining deficits: Unknown, as pt did not return after last visit. He called and cancelled remaining appt's reporting he no longer needed therapy.    Education / Equipment: HEP. PT also encouraged pt to notify MD about blurry vision, as vision changes are likely contributing to pt's imbalance.  Plan: Patient agrees to discharge.  Patient goals were not met. Patient is being discharged due to not returning since the last visit.  ?????       Lakelynn Severtson L 08/10/2016, 1:52 PM  [facility] [address] [place], [place], [address] Phone: [phone]   Fax:  [phone]       G-Codes - 08/10/16 1352    Functional Assessment Tool  Used FGA: 18/30, same as eval as pt did not return since last visit.    Functional Limitation Mobility: Walking and moving around   Mobility: Walking and Moving Around Goal Status [phone]) At least 40 percent but less than 60 percent impaired, limited or restricted   Mobility: Walking and Moving Around Discharge Status [phone]) At least 40 percent but less than 60 percent impaired, limited or restricted       Geoffry Paradise, PT,DPT 08-10-16 1:52 PM Phone: [phone] Fax: [phone]

## 2016-07-26 ENCOUNTER — Other Ambulatory Visit: Payer: Self-pay | Admitting: *Deleted

## 2016-07-26 MED ORDER — TAMSULOSIN HCL 0.4 MG PO CAPS: 0.4000 mg | ORAL_CAPSULE | Freq: Every day | ORAL | 2 refills | Status: DC

## 2016-07-26 MED ORDER — SIMVASTATIN 20 MG PO TABS: 20.0000 mg | ORAL_TABLET | Freq: Every evening | ORAL | 2 refills | Status: DC

## 2016-07-26 NOTE — Telephone Encounter (Signed)
Left msg on triage stating father needing refills sent to [facility] home delivery for Simvastatin & flomax. Notified daughter refills sent electronically...Johny Chess

## 2016-07-27 ENCOUNTER — Ambulatory Visit: Payer: Medicare Other | Admitting: Physical Therapy

## 2016-07-29 ENCOUNTER — Ambulatory Visit: Payer: Medicare Other

## 2016-08-12 ENCOUNTER — Telehealth (HOSPITAL_COMMUNITY): Payer: Self-pay

## 2016-08-12 NOTE — Telephone Encounter (Signed)
Spoke with pt's daughter Freda Munro, agreed to have pt f/u in 6 months with US carotid. AW

## 2016-08-17 ENCOUNTER — Encounter: Payer: Self-pay | Admitting: Internal Medicine

## 2016-08-17 ENCOUNTER — Telehealth (HOSPITAL_COMMUNITY): Payer: Self-pay

## 2016-08-17 ENCOUNTER — Ambulatory Visit (INDEPENDENT_AMBULATORY_CARE_PROVIDER_SITE_OTHER): Payer: Medicare Other | Admitting: Internal Medicine

## 2016-08-17 VITALS — BP 120/80 | HR 62 | Wt 137.0 lb

## 2016-08-17 DIAGNOSIS — K219 Gastro-esophageal reflux disease without esophagitis: Secondary | ICD-10-CM

## 2016-08-17 DIAGNOSIS — R202 Paresthesia of skin: Secondary | ICD-10-CM

## 2016-08-17 DIAGNOSIS — I10 Essential (primary) hypertension: Secondary | ICD-10-CM | POA: Diagnosis not present

## 2016-08-17 DIAGNOSIS — R42 Dizziness and giddiness: Secondary | ICD-10-CM

## 2016-08-17 DIAGNOSIS — R200 Anesthesia of skin: Secondary | ICD-10-CM | POA: Insufficient documentation

## 2016-08-17 DIAGNOSIS — E785 Hyperlipidemia, unspecified: Secondary | ICD-10-CM

## 2016-08-17 NOTE — Assessment & Plan Note (Signed)
No improvement with meclizine or PT Will refer to neuro

## 2016-08-17 NOTE — Assessment & Plan Note (Addendum)
continue statin LDL at goal of less than 70 with current dose

## 2016-08-17 NOTE — Telephone Encounter (Signed)
Pt's daughter stated that shortly after pt had tx that the iv that he had infiltrated and blew up to the size of a baseball. He started to have numbness and tingling and came back in to see Dr. Estanislado Pandy. Dr Estanislado Pandy took a look and it seemed to be ok and referred him to have his pcp take a look. The pt went to the pcp and everything checked out. His arm was very black and blue and his arm was still numb off and on and his fingers were tingling. Now he is unable to grasp anything with that arm all of a sudden off and on. His daughter is very worried and wants to know what they need to do. Called to D to find out what he suggests. Told her I will leave go ask D and get back with her. Pt's daughter agreed with plan. AW

## 2016-08-17 NOTE — Assessment & Plan Note (Signed)
BP well controlled Current regimen effective and well tolerated Continue current medications at current doses  
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
## 2016-08-17 NOTE — Assessment & Plan Note (Signed)
He feels it is related to IV attempts when he was in the hospital over the summer Numbness in fingers and arm persistent, some weakness Will refer to neuro for further evaluation

## 2016-08-17 NOTE — Assessment & Plan Note (Signed)
Taking otc medication - prilosec GERD controlled Continue daily medication

## 2016-08-17 NOTE — Patient Instructions (Signed)
  All other Health Maintenance issues reviewed.   All recommended immunizations and age-appropriate screenings are up-to-date or discussed.  No immunizations administered today.   Medications reviewed and updated.  No changes recommended at this time.   A referral was ordered for neurology.  Please followup in 6 months

## 2016-08-18 ENCOUNTER — Encounter (HOSPITAL_COMMUNITY): Payer: Self-pay

## 2016-08-18 ENCOUNTER — Emergency Department (HOSPITAL_COMMUNITY): Payer: Medicare Other

## 2016-08-18 ENCOUNTER — Observation Stay (HOSPITAL_COMMUNITY)
Admission: EM | Admit: 2016-08-18 | Discharge: 2016-08-20 | Payer: Medicare Other | Attending: Internal Medicine | Admitting: Internal Medicine

## 2016-08-18 ENCOUNTER — Telehealth (HOSPITAL_COMMUNITY): Payer: Self-pay | Admitting: Radiology

## 2016-08-18 DIAGNOSIS — I2510 Atherosclerotic heart disease of native coronary artery without angina pectoris: Secondary | ICD-10-CM | POA: Diagnosis not present

## 2016-08-18 DIAGNOSIS — I4892 Unspecified atrial flutter: Secondary | ICD-10-CM | POA: Insufficient documentation

## 2016-08-18 DIAGNOSIS — E785 Hyperlipidemia, unspecified: Secondary | ICD-10-CM | POA: Diagnosis present

## 2016-08-18 DIAGNOSIS — H8110 Benign paroxysmal vertigo, unspecified ear: Secondary | ICD-10-CM | POA: Insufficient documentation

## 2016-08-18 DIAGNOSIS — I484 Atypical atrial flutter: Secondary | ICD-10-CM | POA: Diagnosis not present

## 2016-08-18 DIAGNOSIS — N400 Enlarged prostate without lower urinary tract symptoms: Secondary | ICD-10-CM | POA: Diagnosis not present

## 2016-08-18 DIAGNOSIS — I671 Cerebral aneurysm, nonruptured: Secondary | ICD-10-CM | POA: Diagnosis not present

## 2016-08-18 DIAGNOSIS — Z9842 Cataract extraction status, left eye: Secondary | ICD-10-CM | POA: Insufficient documentation

## 2016-08-18 DIAGNOSIS — I25810 Atherosclerosis of coronary artery bypass graft(s) without angina pectoris: Secondary | ICD-10-CM | POA: Diagnosis present

## 2016-08-18 DIAGNOSIS — M50322 Other cervical disc degeneration at C5-C6 level: Secondary | ICD-10-CM | POA: Insufficient documentation

## 2016-08-18 DIAGNOSIS — I25718 Atherosclerosis of autologous vein coronary artery bypass graft(s) with other forms of angina pectoris: Secondary | ICD-10-CM

## 2016-08-18 DIAGNOSIS — Z9889 Other specified postprocedural states: Secondary | ICD-10-CM | POA: Insufficient documentation

## 2016-08-18 DIAGNOSIS — Z8611 Personal history of tuberculosis: Secondary | ICD-10-CM | POA: Insufficient documentation

## 2016-08-18 DIAGNOSIS — I6529 Occlusion and stenosis of unspecified carotid artery: Secondary | ICD-10-CM | POA: Diagnosis present

## 2016-08-18 DIAGNOSIS — I700 Atherosclerosis of aorta: Secondary | ICD-10-CM | POA: Diagnosis not present

## 2016-08-18 DIAGNOSIS — R29898 Other symptoms and signs involving the musculoskeletal system: Secondary | ICD-10-CM | POA: Diagnosis present

## 2016-08-18 DIAGNOSIS — Z86010 Personal history of colonic polyps: Secondary | ICD-10-CM | POA: Insufficient documentation

## 2016-08-18 DIAGNOSIS — I714 Abdominal aortic aneurysm, without rupture: Secondary | ICD-10-CM | POA: Diagnosis not present

## 2016-08-18 DIAGNOSIS — R4701 Aphasia: Secondary | ICD-10-CM | POA: Diagnosis not present

## 2016-08-18 DIAGNOSIS — Z7901 Long term (current) use of anticoagulants: Secondary | ICD-10-CM | POA: Insufficient documentation

## 2016-08-18 DIAGNOSIS — I6522 Occlusion and stenosis of left carotid artery: Secondary | ICD-10-CM

## 2016-08-18 DIAGNOSIS — Z961 Presence of intraocular lens: Secondary | ICD-10-CM | POA: Insufficient documentation

## 2016-08-18 DIAGNOSIS — Z8711 Personal history of peptic ulcer disease: Secondary | ICD-10-CM | POA: Diagnosis not present

## 2016-08-18 DIAGNOSIS — M1990 Unspecified osteoarthritis, unspecified site: Secondary | ICD-10-CM | POA: Insufficient documentation

## 2016-08-18 DIAGNOSIS — I252 Old myocardial infarction: Secondary | ICD-10-CM | POA: Insufficient documentation

## 2016-08-18 DIAGNOSIS — E782 Mixed hyperlipidemia: Secondary | ICD-10-CM | POA: Insufficient documentation

## 2016-08-18 DIAGNOSIS — Z951 Presence of aortocoronary bypass graft: Secondary | ICD-10-CM | POA: Diagnosis not present

## 2016-08-18 DIAGNOSIS — I350 Nonrheumatic aortic (valve) stenosis: Secondary | ICD-10-CM | POA: Insufficient documentation

## 2016-08-18 DIAGNOSIS — Z85828 Personal history of other malignant neoplasm of skin: Secondary | ICD-10-CM | POA: Insufficient documentation

## 2016-08-18 DIAGNOSIS — I10 Essential (primary) hypertension: Secondary | ICD-10-CM | POA: Diagnosis not present

## 2016-08-18 DIAGNOSIS — Z9841 Cataract extraction status, right eye: Secondary | ICD-10-CM | POA: Insufficient documentation

## 2016-08-18 DIAGNOSIS — I495 Sick sinus syndrome: Secondary | ICD-10-CM | POA: Diagnosis present

## 2016-08-18 DIAGNOSIS — I6502 Occlusion and stenosis of left vertebral artery: Secondary | ICD-10-CM | POA: Diagnosis not present

## 2016-08-18 DIAGNOSIS — K219 Gastro-esophageal reflux disease without esophagitis: Secondary | ICD-10-CM | POA: Insufficient documentation

## 2016-08-18 DIAGNOSIS — I480 Paroxysmal atrial fibrillation: Secondary | ICD-10-CM

## 2016-08-18 DIAGNOSIS — Z950 Presence of cardiac pacemaker: Secondary | ICD-10-CM | POA: Insufficient documentation

## 2016-08-18 DIAGNOSIS — I482 Chronic atrial fibrillation: Secondary | ICD-10-CM | POA: Insufficient documentation

## 2016-08-18 LAB — DIFFERENTIAL
Basophils Absolute: 0 10*3/uL (ref 0.0–0.1)
Basophils Relative: 1 %
EOS ABS: 0.3 10*3/uL (ref 0.0–0.7)
EOS PCT: 5 %
LYMPHS ABS: 1.9 10*3/uL (ref 0.7–4.0)
LYMPHS PCT: 37 %
MONO ABS: 0.6 10*3/uL (ref 0.1–1.0)
MONOS PCT: 11 %
NEUTROS PCT: 46 %
Neutro Abs: 2.4 10*3/uL (ref 1.7–7.7)

## 2016-08-18 LAB — COMPREHENSIVE METABOLIC PANEL
ALK PHOS: 52 U/L (ref 38–126)
ALT: 13 U/L — ABNORMAL LOW (ref 17–63)
ANION GAP: 8 (ref 5–15)
AST: 19 U/L (ref 15–41)
Albumin: 3.9 g/dL (ref 3.5–5.0)
BILIRUBIN TOTAL: 0.5 mg/dL (ref 0.3–1.2)
BUN: 11 mg/dL (ref 6–20)
CALCIUM: 9 mg/dL (ref 8.9–10.3)
CO2: 23 mmol/L (ref 22–32)
Chloride: 106 mmol/L (ref 101–111)
Creatinine, Ser: 0.98 mg/dL (ref 0.61–1.24)
GFR calc non Af Amer: >60 mL/min (ref >60)
Glucose, Bld: 130 mg/dL — ABNORMAL HIGH (ref 65–99)
POTASSIUM: 4 mmol/L (ref 3.5–5.1)
Sodium: 137 mmol/L (ref 135–145)
TOTAL PROTEIN: 6.4 g/dL — AB (ref 6.5–8.1)

## 2016-08-18 LAB — CBC
HEMATOCRIT: 39.5 % (ref 39.0–52.0)
HEMOGLOBIN: 13.8 g/dL (ref 13.0–17.0)
MCH: 31.2 pg (ref 26.0–34.0)
MCHC: 34.9 g/dL (ref 30.0–36.0)
MCV: 89.4 fL (ref 78.0–100.0)
Platelets: 196 10*3/uL (ref 150–400)
RBC: 4.42 MIL/uL (ref 4.22–5.81)
RDW: 13.3 % (ref 11.5–15.5)
WBC: 5.2 10*3/uL (ref 4.0–10.5)

## 2016-08-18 LAB — PROTIME-INR
INR: 1.21
Prothrombin Time: 15.4 seconds — ABNORMAL HIGH (ref 11.4–15.2)

## 2016-08-18 LAB — I-STAT TROPONIN, ED: TROPONIN I, POC: 0.01 ng/mL (ref 0.00–0.08)

## 2016-08-18 LAB — APTT: aPTT: 35 seconds (ref 24–36)

## 2016-08-18 MED ORDER — SIMVASTATIN 20 MG PO TABS
20.0000 mg | ORAL_TABLET | Freq: Every evening | ORAL | Status: DC
  Administered 2016-08-19: 20 mg via ORAL
  Filled 2016-08-18: qty 1

## 2016-08-18 MED ORDER — RANOLAZINE ER 500 MG PO TB12
500.0000 mg | ORAL_TABLET | Freq: Two times a day (BID) | ORAL | Status: DC
  Administered 2016-08-19 – 2016-08-20 (×4): 500 mg via ORAL
  Filled 2016-08-18 (×4): qty 1

## 2016-08-18 MED ORDER — SODIUM CHLORIDE 0.9 % IV SOLN
INTRAVENOUS | Status: AC
  Administered 2016-08-19: via INTRAVENOUS

## 2016-08-18 MED ORDER — DILTIAZEM HCL ER COATED BEADS 120 MG PO CP24
120.0000 mg | ORAL_CAPSULE | Freq: Every day | ORAL | Status: DC
  Administered 2016-08-19 – 2016-08-20 (×2): 120 mg via ORAL
  Filled 2016-08-18 (×2): qty 1

## 2016-08-18 MED ORDER — NITROGLYCERIN 0.4 MG SL SUBL: 0.4000 mg | SUBLINGUAL_TABLET | SUBLINGUAL | Status: DC | PRN

## 2016-08-18 MED ORDER — SENNOSIDES-DOCUSATE SODIUM 8.6-50 MG PO TABS: 1.0000 | ORAL_TABLET | Freq: Every evening | ORAL | Status: DC | PRN

## 2016-08-18 MED ORDER — PREDNISOLONE ACETATE 1 % OP SUSP: 1.0000 [drp] | Freq: Four times a day (QID) | OPHTHALMIC | Status: DC | PRN

## 2016-08-18 MED ORDER — TAMSULOSIN HCL 0.4 MG PO CAPS
0.4000 mg | ORAL_CAPSULE | Freq: Every day | ORAL | Status: DC
  Administered 2016-08-19 – 2016-08-20 (×2): 0.4 mg via ORAL
  Filled 2016-08-18 (×2): qty 1

## 2016-08-18 MED ORDER — IRBESARTAN 150 MG PO TABS
150.0000 mg | ORAL_TABLET | Freq: Every day | ORAL | Status: DC
  Administered 2016-08-19: 150 mg via ORAL
  Filled 2016-08-18: qty 1

## 2016-08-18 MED ORDER — MECLIZINE HCL 12.5 MG PO TABS: 25.0000 mg | ORAL_TABLET | Freq: Three times a day (TID) | ORAL | Status: DC | PRN

## 2016-08-18 MED ORDER — APIXABAN 2.5 MG PO TABS
2.5000 mg | ORAL_TABLET | Freq: Two times a day (BID) | ORAL | Status: DC
  Administered 2016-08-19 – 2016-08-20 (×4): 2.5 mg via ORAL
  Filled 2016-08-18 (×4): qty 1

## 2016-08-18 MED ORDER — ACETAMINOPHEN 325 MG PO TABS: 650.0000 mg | ORAL_TABLET | Freq: Four times a day (QID) | ORAL | Status: DC | PRN

## 2016-08-18 NOTE — ED Notes (Signed)
Pt Aox4 with family members, IV R forearm, passed swallow screen and NIH of 1. Pt comfortable and denies pain, VS stable on room air, complaint of R hand and arm numbness for 3 days

## 2016-08-18 NOTE — ED Triage Notes (Signed)
Patient complains of right arm/hand weakness x 3 days with difficulty walking. States that he realized something was wrong when he had difficulty texting and typing. Alert and oriented, denies pain. Told he needed to be seen for possible stroke

## 2016-08-18 NOTE — ED Notes (Signed)
Pt has had numbness in R hand for 3 days and it is intermittent, AOx4

## 2016-08-18 NOTE — Progress Notes (Signed)
Patient arrived to 5M22 from the ED. Safety precautions and orders reviewed at this time. TELE applied and confirmed. VSS. MD paged of arrival.    [place], RN

## 2016-08-18 NOTE — ED Notes (Signed)
Admitting at bedside 
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
## 2016-08-18 NOTE — Telephone Encounter (Signed)
Pt's daughter called and left a VM today stating that her dad was having problems with inability to grasp or hold objects for a 2 hour period on 08/17/16. She states he has continued off and on numbness and tingling to right hand/forearm. Now today he has developed problems with memory. He was unable to use his cell phone and could not remember how to text or call someone. He appears confused. I spoke with Deveshwar and he said that if the patient was having these problems he should seek medical attention in the ER, as these could be possible stroke related sx. The pt's daughter agreed that her father needs to come to the ER and be evaluated right away. She will speak to her father. JM

## 2016-08-19 ENCOUNTER — Observation Stay (HOSPITAL_COMMUNITY): Payer: Medicare Other

## 2016-08-19 ENCOUNTER — Encounter (HOSPITAL_COMMUNITY): Payer: Self-pay | Admitting: *Deleted

## 2016-08-19 DIAGNOSIS — I671 Cerebral aneurysm, nonruptured: Secondary | ICD-10-CM | POA: Diagnosis not present

## 2016-08-19 DIAGNOSIS — R29898 Other symptoms and signs involving the musculoskeletal system: Secondary | ICD-10-CM | POA: Diagnosis not present

## 2016-08-19 LAB — HEMOGLOBIN A1C
HEMOGLOBIN A1C: 5.4 % (ref 4.8–5.6)
Mean Plasma Glucose: 108 mg/dL

## 2016-08-19 LAB — LIPID PANEL
Cholesterol: 120 mg/dL (ref 0–200)
HDL: 33 mg/dL — AB (ref >40)
LDL CALC: 47 mg/dL (ref 0–99)
Total CHOL/HDL Ratio: 3.6 RATIO
Triglycerides: 198 mg/dL — ABNORMAL HIGH (ref <150)
VLDL: 40 mg/dL (ref 0–40)

## 2016-08-19 MED ORDER — ATORVASTATIN CALCIUM 10 MG PO TABS
10.0000 mg | ORAL_TABLET | Freq: Every day | ORAL | Status: DC
Start: 2016-08-19 — End: 2016-08-20
  Administered 2016-08-19: 10 mg via ORAL
  Filled 2016-08-19: qty 1

## 2016-08-19 MED ORDER — IOPAMIDOL (ISOVUE-370) INJECTION 76%
INTRAVENOUS | Status: AC
  Administered 2016-08-19: 50 mL
  Filled 2016-08-19: qty 50

## 2016-08-19 MED ORDER — SODIUM CHLORIDE 0.9 % IV SOLN
INTRAVENOUS | Status: DC
  Administered 2016-08-19: 1000 mL via INTRAVENOUS
  Administered 2016-08-19: 18:00:00 via INTRAVENOUS

## 2016-08-19 MED ORDER — IOPAMIDOL (ISOVUE-370) INJECTION 76%
INTRAVENOUS | Status: AC
  Administered 2016-08-19: 40 mL via INTRAVENOUS
  Filled 2016-08-19: qty 50

## 2016-08-19 MED ORDER — SODIUM CHLORIDE 0.9% FLUSH
10.0000 mL | INTRAVENOUS | Status: DC | PRN
Start: 2016-08-19 — End: 2016-08-20
  Administered 2016-08-20: 10 mL
  Filled 2016-08-19: qty 40

## 2016-08-19 NOTE — Consult Note (Signed)
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  Right Hand Weakness and Expressive Aphasia: Concern for subacute stroke given symptoms and history. CT Head wo contrast showed no acute intracranial abnormality. CTA Head and Neck showed similar mild narrowing left mid cervical internal carotid artery favoring old injury without flow limiting stenosis and left internal carotid artery pipeline stent with worsening proximal in stent stenosis suspected. 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 Unable to obtain MRI due to Pacemaker. He has minimal deficits on examination. He seems convinced his difficulty with his right hand is due to a previous hematoma from 2-3 months ago. However, I am more concerned that his stenoses are causing issues rather than an acute CVA- especially with persistence of symptoms over the last 3 months. Patient may be experiencing symptomatic episodes from an in-stent stenosis that are more profound during episodes of low blood pressures.  -CT perfusion scan -Will discuss discontinuing Eliquis with Dr. Leonel Ramsay and restarting Candace Cruise, DO PGY-3 Internal Medicine Resident Pager # [phone] 08/19/2016 10:39 AM

## 2016-08-19 NOTE — Progress Notes (Signed)
PROGRESS NOTE                                                                                                                                                                                                             Patient Demographics:    Shaun James, is a 82 y.o. male, DOB - 19-Feb-1931, [id number]  Admit date - 08/18/2016   Admitting Physician Vianne Bulls, MD  Outpatient Primary MD for the patient is Binnie Rail, MD  LOS - 0  Chief Complaint  Patient presents with  . Weakness       Brief Narrative  Shaun James is a 82 y.o. 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   Patient had a CTA of the head and neck performed in June of this year which revealed 80-85% stenosis in the left internal carotid artery and a posterior communicating artery aneurysm. In September, arteriogram was performed of the left common carotid and vertebral artery with placement of the flow diver and her into aneurysm and angioplasty of the left common carotid with postprocedure stenosis of 50%. Procedure was complicated by spontaneous hemorrhage in the left neck, but this resolved promptly and he was discharged home in stable condition. Unfortunately, since that time, patient reports worsening in his vision and hearing and feels that his gait has been less stable. He was admitted with three-day history of right hand weakness, right forearm numbness and some difficulties in word finding.      Subjective:    Dann Buenafe today has, No headache, No chest pain, No abdominal pain - No Nausea, No new weakness tingling or  numbness, No Cough - SOB. Right arm tendon numbness and weakness has improved.   Assessment  & Plan :     1.Acute on chronic right arm weakness, some transient difficulty in word finding which has now resolved. Head CT unremarkable, has a pacemaker precluding MRI. CT angiogram of the head and neck shows left internal carotid artery stent with possible stenosis, severe stenosis of the left cavernous internal carotid artery,  small aneurysm at the left M1 origin at the level of stent.  He's been seen by neurology, CT perfusion study has been ordered for his brain, is currently on Eliquis and statin for secondary prevention which will be continued. Further management per neurology. Symptoms have considerably improved according to the patient.   2. Dyslipidemia. On statin dose adjusted due to diltiazem. LDL stable.  3. Carotid artery stenosis, carotid aneurysm. Defer to neuro and neuro.  4. Chronic A. fib. [place] vasc 2 score of 4. On Eliquis and diltiazem.  5.CAD - Continue ARB, statin.   6. Hypertension. Stable on Cardizem and ARB.  7. BPH. On Flomax.    Family Communication  :  Wife  Code Status :  Full  Diet : Diet Heart Room service appropriate? Yes; Fluid consistency: Thin   Disposition Plan  :  TBD  Consults  :  Neuro, IR Neuro  Procedures  :      DVT Prophylaxis  :  Eliquis  Lab Results  Component Value Date   PLT 196 08/18/2016    Inpatient Medications  Scheduled Meds: . apixaban  2.5 mg Oral BID  . atorvastatin  10 mg Oral q1800  . diltiazem  120 mg Oral Daily  . irbesartan  150 mg Oral Daily  . ranolazine  500 mg Oral BID  . tamsulosin  0.4 mg Oral Daily   Continuous Infusions: PRN Meds:.acetaminophen, meclizine, nitroGLYCERIN, prednisoLONE acetate, senna-docusate  Antibiotics  :    Anti-infectives    None         Objective:   Vitals:   08/19/16 0400 08/19/16 0600 08/19/16 0800 08/19/16 1000  BP: 125/66 121/66 120/64 (!) 143/79  Pulse: 60 60  (!) 57 63  Resp: 18 18 16 16   Temp: 98 F (36.7 C) 98.3 F (36.8 C)  97.7 F (36.5 C)  TempSrc: Oral Oral  Oral  SpO2: 97% 100% 94% 98%  Weight:        Wt Readings from Last 3 Encounters:  08/18/16 62.6 kg (138 lb)  08/17/16 62.1 kg (137 lb)  06/29/16 60.8 kg (134 lb)     Intake/Output Summary (Last 24 hours) at 08/19/16 1408 Last data filed at 08/19/16 [id number]  Gross per 24 hour  Intake              714 ml  Output                0 ml  Net              714 ml     Physical Exam  Awake Alert, Oriented X 3, No new F.N deficits, Normal affect [place].AT,PERRAL Supple Neck,No JVD, No cervical lymphadenopathy appriciated.  Symmetrical Chest wall movement, Good air movement bilaterally, CTAB RRR,No Gallops,Rubs or new Murmurs, No Parasternal Heave +ve B.Sounds, Abd Soft, No tenderness, No organomegaly appriciated, No rebound - guarding or rigidity. No Cyanosis, Clubbing or edema, No new Rash or bruise       Data Review:    CBC  Recent Labs Lab 08/18/16 1712  WBC 5.2  HGB 13.8  HCT 39.5  PLT 196  MCV 89.4  MCH 31.2  MCHC 34.9  RDW 13.3  LYMPHSABS 1.9  MONOABS 0.6  EOSABS 0.3  BASOSABS 0.0    Chemistries   Recent Labs Lab 08/18/16 1712  NA 137  K 4.0  CL 106  CO2 23  GLUCOSE 130*  BUN 11  CREATININE 0.98  CALCIUM 9.0  AST 19  ALT 13*  ALKPHOS 52  BILITOT 0.5   ------------------------------------------------------------------------------------------------------------------  Recent Labs  08/19/16 0404  CHOL 120  HDL 33*  LDLCALC 47  TRIG 198*  CHOLHDL 3.6    Lab Results  Component Value Date   HGBA1C 5.5 04/11/2015   ------------------------------------------------------------------------------------------------------------------ No results for input(s): TSH, T4TOTAL, T3FREE, THYROIDAB in the last 72 hours.  Invalid input(s):  FREET3 ------------------------------------------------------------------------------------------------------------------ No results for input(s): VITAMINB12, FOLATE, FERRITIN, TIBC, IRON, RETICCTPCT in the last 72 hours.  Coagulation profile  Recent Labs Lab 08/18/16 1712  INR 1.21    No results for input(s): DDIMER in the last 72 hours.  Cardiac Enzymes No results for input(s): CKMB, TROPONINI, MYOGLOBIN in the last 168 hours.  Invalid input(s): CK ------------------------------------------------------------------------------------------------------------------ No results found for: BNP  Micro Results No results found for this or any previous visit (from the past 240 hour(s)).  Radiology Reports Ct Angio Head W/cm &/or Wo Cm  Result Date: 08/19/2016 CLINICAL DATA:  Aneurysm repair September 2017 (LEFT internal carotid artery aneurysm, status post pipeline stent). RIGHT upper extremity numbness for 3 days. Confusion and weakness in his legs. EXAM: CT ANGIOGRAPHY HEAD AND NECK TECHNIQUE: Multidetector CT imaging of the head and neck was performed using the standard protocol during bolus administration of intravenous contrast. Multiplanar CT image reconstructions and MIPs were obtained to evaluate the vascular anatomy. Carotid stenosis measurements (when applicable) are obtained utilizing NASCET criteria, using the distal internal carotid diameter as the denominator. CONTRAST:  50 cc Isovue 370 COMPARISON:  CT HEAD August 18, 2016 FINDINGS: CTA NECK AORTIC ARCH: Normal appearance of the thoracic arch, normal branch pattern. The origins of the innominate, left Common carotid artery and subclavian artery are widely patent. Mild calcific atherosclerosis of aortic arch and arch vessel origins. Proximal LEFT subclavian artery old. RIGHT CAROTID SYSTEM: Common carotid artery is widely patent, coursing in a straight line fashion. Normal appearance of the carotid bifurcation without  hemodynamically significant stenosis by NASCET criteria. Mild eccentric calcific atherosclerosis. Normal appearance of the included internal carotid artery. LEFT CAROTID SYSTEM: Common carotid artery is widely patent, coursing in a straight line fashion. Normal appearance of the carotid bifurcation without hemodynamically significant stenosis by NASCET criteria. Moderate eccentric calcific atherosclerosis. Similar mild stenotic long segment of LEFT cervical internal carotid artery well intimal hematoma or dissection flap. Abrupt change to normal caliber mid to distal cervical internal carotid artery. VERTEBRAL ARTERIES:RIGHT vertebral artery is dominant. Normal appearance of the vertebral arteries, which appear widely patent. LEFT distal V2 segment luminal irregularity without caliber change favoring fold. SKELETON: No acute osseous process though bone windows have not been submitted. Multilevel moderate degenerative disc resulting in severe LEFT C3-4, moderate to severe RIGHT C5-6 neural foraminal narrowing. OTHER NECK: Soft tissues of the neck are non-acute though, not tailored for evaluation. LEFT cardiac pacemaker. Status post median sternotomy. CTA HEAD ANTERIOR CIRCULATION: Asymmetrically smaller LEFT petrous segment is unchanged. Thready proximal LEFT cavernous segment associated with severe atherosclerosis, worse than prior study. Vessel remains patent though, there is poor contrast opacification. Proximal LEFT internal carotid artery pipeline stent is narrowed and unchanged, distal stent appears patent. Posterior directed 3 x 3 mm LEFT supraclinoid aneurysm with homogeneous contrast opacification. Origin of LEFT M1 at the level of the stent is not well characterized, with slight asymmetrically decreased contrast opacification of LEFT middle cerebral arteries which are otherwise unremarkable. Hypoplastic LEFT A1 segment with azygos ACA. RIGHT MCA bifurcation blister aneurysm. POSTERIOR CIRCULATION: Moderate  stenosis LEFT V4 origin. Normal vertebrobasilar  junction and basilar artery, as well as main branch vessels. Normal appearance of the posterior cerebral arteries. Small bilateral posterior communicating arteries present. No large vessel occlusion, hemodynamically significant stenosis, dissection, luminal irregularity, contrast extravasation or aneurysm. VENOUS SINUSES: Major dural venous sinuses are patent though not tailored for evaluation on this angiographic examination. ANATOMIC VARIANTS: None. DELAYED PHASE: No abnormal intracranial enhancement. IMPRESSION: CTA NECK: Similar mild narrowing LEFT mid cervical internal carotid artery favors old injury without flow limiting stenosis. Atherosclerosis without hemodynamically significant stenosis. CTA HEAD: LEFT internal carotid artery pipeline stent with worsening proximal in stent stenosis suspected. In addition, severe stenosis LEFT cavernous internal carotid artery secondary to atherosclerosis. Suspected stenosis LEFT M1 origin at the level of the stent. Similar 3 x 3 mm LEFT supraclinoid internal carotid artery aneurysm. Stable moderate stenosis LEFT V4 origin. Electronically Signed   By: Elon Alas M.D.   On: 08/19/2016 03:21   Ct Head Wo Contrast  Result Date: 08/18/2016 CLINICAL DATA:  Increasing weakness.  Aneurysm surgery 3 months ago. EXAM: CT HEAD WITHOUT CONTRAST TECHNIQUE: Contiguous axial images were obtained from the base of the skull through the vertex without intravenous contrast. COMPARISON:  07/01/2016 FINDINGS: Brain: There is no evidence for acute hemorrhage, hydrocephalus, mass lesion, or abnormal extra-axial fluid collection. No definite CT evidence for acute infarction. Diffuse loss of parenchymal volume is consistent with atrophy. Patchy low attenuation in the deep hemispheric and periventricular white matter is nonspecific, but likely reflects chronic microvascular ischemic demyelination. Vascular: Pipeline stent again noted in  the left ICA. Skull: No evidence for fracture. No worrisome lytic or sclerotic lesion. Sinuses/Orbits: The visualized paranasal sinuses and mastoid air cells are clear. Visualized portions of the globes and intraorbital fat are unremarkable. Other: None. IMPRESSION: 1. Stable exam.  No acute intracranial abnormality. 2. Atrophy with chronic small vessel white matter ischemic disease. Electronically Signed   By: Misty Stanley M.D.   On: 08/18/2016 17:34   Ct Angio Neck W/cm &/or Wo/cm  Result Date: 08/19/2016 CLINICAL DATA:  Aneurysm repair September 2017 (LEFT internal carotid artery aneurysm, status post pipeline stent). RIGHT upper extremity numbness for 3 days. Confusion and weakness in his legs. EXAM: CT ANGIOGRAPHY HEAD AND NECK TECHNIQUE: Multidetector CT imaging of the head and neck was performed using the standard protocol during bolus administration of intravenous contrast. Multiplanar CT image reconstructions and MIPs were obtained to evaluate the vascular anatomy. Carotid stenosis measurements (when applicable) are obtained utilizing NASCET criteria, using the distal internal carotid diameter as the denominator. CONTRAST:  50 cc Isovue 370 COMPARISON:  CT HEAD August 18, 2016 FINDINGS: CTA NECK AORTIC ARCH: Normal appearance of the thoracic arch, normal branch pattern. The origins of the innominate, left Common carotid artery and subclavian artery are widely patent. Mild calcific atherosclerosis of aortic arch and arch vessel origins. Proximal LEFT subclavian artery old. RIGHT CAROTID SYSTEM: Common carotid artery is widely patent, coursing in a straight line fashion. Normal appearance of the carotid bifurcation without hemodynamically significant stenosis by NASCET criteria. Mild eccentric calcific atherosclerosis. Normal appearance of the included internal carotid artery. LEFT CAROTID SYSTEM: Common carotid artery is widely patent, coursing in a straight line fashion. Normal appearance of the  carotid bifurcation without hemodynamically significant stenosis by NASCET criteria. Moderate eccentric calcific atherosclerosis. Similar mild stenotic long segment of LEFT cervical internal carotid artery well intimal hematoma or dissection flap. Abrupt change to normal caliber mid to distal cervical internal carotid artery. VERTEBRAL ARTERIES:RIGHT vertebral artery is dominant. Normal appearance  of the vertebral arteries, which appear widely patent. LEFT distal V2 segment luminal irregularity without caliber change favoring fold. SKELETON: No acute osseous process though bone windows have not been submitted. Multilevel moderate degenerative disc resulting in severe LEFT C3-4, moderate to severe RIGHT C5-6 neural foraminal narrowing. OTHER NECK: Soft tissues of the neck are non-acute though, not tailored for evaluation. LEFT cardiac pacemaker. Status post median sternotomy. CTA HEAD ANTERIOR CIRCULATION: Asymmetrically smaller LEFT petrous segment is unchanged. Thready proximal LEFT cavernous segment associated with severe atherosclerosis, worse than prior study. Vessel remains patent though, there is poor contrast opacification. Proximal LEFT internal carotid artery pipeline stent is narrowed and unchanged, distal stent appears patent. Posterior directed 3 x 3 mm LEFT supraclinoid aneurysm with homogeneous contrast opacification. Origin of LEFT M1 at the level of the stent is not well characterized, with slight asymmetrically decreased contrast opacification of LEFT middle cerebral arteries which are otherwise unremarkable. Hypoplastic LEFT A1 segment with azygos ACA. RIGHT MCA bifurcation blister aneurysm. POSTERIOR CIRCULATION: Moderate stenosis LEFT V4 origin. Normal vertebrobasilar junction and basilar artery, as well as main branch vessels. Normal appearance of the posterior cerebral arteries. Small bilateral posterior communicating arteries present. No large vessel occlusion, hemodynamically significant  stenosis, dissection, luminal irregularity, contrast extravasation or aneurysm. VENOUS SINUSES: Major dural venous sinuses are patent though not tailored for evaluation on this angiographic examination. ANATOMIC VARIANTS: None. DELAYED PHASE: No abnormal intracranial enhancement. IMPRESSION: CTA NECK: Similar mild narrowing LEFT mid cervical internal carotid artery favors old injury without flow limiting stenosis. Atherosclerosis without hemodynamically significant stenosis. CTA HEAD: LEFT internal carotid artery pipeline stent with worsening proximal in stent stenosis suspected. In addition, severe stenosis LEFT cavernous internal carotid artery secondary to atherosclerosis. Suspected stenosis LEFT M1 origin at the level of the stent. Similar 3 x 3 mm LEFT supraclinoid internal carotid artery aneurysm. Stable moderate stenosis LEFT V4 origin. Electronically Signed   By: Elon Alas M.D.   On: 08/19/2016 03:21   Ir Radiologist Eval & Mgmt  Result Date: 07/21/2016 EXAM: ESTABLISHED PATIENT OFFICE VISIT CHIEF COMPLAINT: Dizziness. Current Pain Level: 1-10 HISTORY OF PRESENT ILLNESS: The patient is an 82 year old right-handed gentleman who is status post endovascular treatment of a wide neck left internal carotid artery terminus aneurysm with a pipeline flow diverter device with a proximal significant stenosis of the supraclinoid left ICA. This was performed on 05/19/2016. The patient was then discharged, and later on seen by his cardiologist. Today he is accompanied by his spouse. Since that time the patient has made a slow but progressive recovery. According to the patient there has been slow but steady recovery to where he feels stronger. His main complaints at the present are those of right medial forearm numbness which is almost constant. This apparently dates to when the patient had bruising of the anterior aspect of the right elbow following insertion of IV tubing. Overall this too appears somewhat  improved though the constant numbness involving the medial forearm, and the little and the index finger on the right hand appear to be bothering him. He denies any associated weakness of the right upper extremity. His other symptom is that of every time he stands up he feels wobbly and dizzy. By dizziness he means he appears unsteady. He denies any vertiginous symptoms. He says sometimes this may be associated with mild visual blurring though he has never passed out or has fallen down or has noted any blindness or double vision. The ringing in his ears remains unchanged even  from prior to the procedure. He denies any problems swallowing or eating. He denies perioral numbness or numbness on the inside of his mouth. His appetite is normal. His weight is steady. He categorically denies any motor weakness of the extremities or any paresthesias of the upper or lower extremities. He also has noted slight worsening in his memory since the procedure. His review of systems in terms of chest pain, shortness of breath or ankle edema, or coughing or wheezing is negative. He denies any lower extremity swelling. Present medications: Tylenol as needed. Eliquis. Diltiazem. Antivert. Nitroglycerin 0.4 mg sublingually as needed. Zofran as needed. Prednisolone acetate ophthalmic suspension. Ranexa. Zocor. Flomax. Brilinta. Diovan. Allergies:  Codeine, penicillins, Zyrtec. PHYSICAL EXAMINATION: Brief examination reveals the patient to be in no acute distress. Affect normal. Spontaneously responsive and appropriate. No lateralizing features of motor, sensory, cranial nerves or coordination. The patient is able to stand without difficulty, however, when standing is a bit wobbly. He denies any visual symptoms during this time. ASSESSMENT AND PLAN: The patient's postprocedural catheter arteriogram and also the recent CT angiogram of the head and neck of 07/01/2016 were reviewed. There continues to demonstrate flow within the previously  positioned flow diverter from the supraclinoid left ICA to the proximal left MCA. There is a suggestion of interval slight decrease in the size of the left internal carotid artery supraclinoid aneurysm. However, the previously noted proximal stenosis of the supraclinoid left ICA is suboptimally visualized due to the presence of the flow diverter, and also the calcification within the cavernous carotid artery. His posterior fossa grossly appears widely patent. Discussion followed regarding follow-up imaging evaluation of his left sided carotid system. This is primarily to accurately evaluate the status of the aneurysm and also the proximal severe stenosis. The CT angiogram report also mentioned relative decreased caliber of the mid cervical left ICA. This was also seen angiographically at the time of the intervention. The patient had a recent ultrasound of his carotids, the results of this are awaiting. In the meantime, the patient has been asked to continue taking his medications as he has been. He sees cardiologist shortly. The patient was informed that he probably needs a diagnostic catheter angiogram of the left carotid system to accurately evaluate the status of the post treatment phase. He could have a CT angiogram with the limitations as described above. Also mentioned was the consideration to see a neurologist in regards to the right forearm numbness medially, and also memory difficulties. The patient reports he would like to think about these suggestions. Questions were answered to his satisfaction. He leaves with good understanding and agreement with the above management plan. He has been advised to use a cane whilst ambulating until he improves his stamina. Electronically Signed   By: Luanne Bras M.D.   On: 07/20/2016 18:36    Time Spent in minutes  30   SINGH,PRASHANT K M.D on 08/19/2016 at 2:08 PM  Between 7am to 7pm - Pager - [phone]  After 7pm go to [email or web address] - password  [facility]  [facility] -  Office  [phone]

## 2016-08-19 NOTE — Progress Notes (Signed)
Patient needs 18 gauge at the [facility]. IV insertion attempted x 1 without success. Placed IV team consult.

## 2016-08-19 NOTE — Progress Notes (Signed)
EKG completed and in chart.   Lamerle Jabs, RN 

## 2016-08-19 NOTE — Progress Notes (Signed)
Patients on diltiazem and simvastatin >10 mg/day have increased risk of rhabdomyolysis. Simvastatin 20mg  changed to atorvastatin 10mg  to avoid interaction.   Thanks, Elenor Quinones, PharmD, BCPS Clinical Pharmacist Pager [phone] 08/19/2016 1:26 PM  '

## 2016-08-19 NOTE — Progress Notes (Signed)
Patient ID: VAHIN DISCHER, male   DOB: August 04, 1931, 82 y.o.   MRN: [id number] INR  Patient known to me. Events,history ,physical and imaging studies noted as above. Patient seen with daughter,and wife. Patient c/o intermittent difficulty speaking,unclear words and right sided numbness and mild weakness. Denies any visual symptoms,or H/As,N/V. Feels frustrated with his condition.according to daughter  And  worried about his new symptoms. Patient on aspirin and eloquis. CT perfusion compatible with hypoperfusion without evidence of a fresh infarct. 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 Had extensive discussions with patient and daughter. Explained to them that patients symptoms are probably related to the severe stenosis. To further accurately evaluate this a formal cathter angiogram would be needed..Depending on the results further management considerations,such intracranial angioplasty with or without stenting may be an option. Also  should the patient or family feel a second opinion was needed this could be arranged as per their wishes. Patient and family would like to discuss amongst themselves overnight before finalizing their decision. Also the patient is on eliquis,which would have to be stopped for 48 hours.May cardiology input in this regard.  Arlean Hopping MD

## 2016-08-19 NOTE — Care Management Note (Signed)
Case Management Note  Patient Details  Name: Shaun James MRN: [id number] Date of Birth: 1931-01-03  Subjective/Objective:                  Patient presented with right hand weakness and difficulty with word finding. Lives at home with spouse. CM will follow for discharge needs pending PT/OT evals and physician orders.   Action/Plan:   Expected Discharge Date:                  Expected Discharge Plan:     In-House Referral:     Discharge planning Services     Post Acute Care Choice:    Choice offered to:     DME Arranged:    DME Agency:     HH Arranged:    HH Agency:     Status of Service:     If discussed at [employer] of Stay Meetings, dates discussed:    Additional Comments:  Rolm Baptise, RN 08/19/2016, 12:15 PM

## 2016-08-19 NOTE — Evaluation (Signed)
Occupational Therapy Evaluation Patient Details Name: Shaun James MRN: [id number] DOB: [birth year] Today's Date: 08/19/2016    History of Present Illness Pt admitted with R hand weakness and word finding problems. CT negative for acute changes. Pt with pacemaker, unable to do MRI. PMH: HTN, AAA, CAD, CABG, afib, SSS with pacemaker, cerebral aneurysm, 3 weeks post angioplasty L carotid artery, AAA, HTN, CAD s/p CABG, afib on Eliquis.   Clinical Impression   Pt reports being independent prior to admission, but admits to falls or near falls without injury. Pt has had OPPT for vertigo in the last 6 months. He states that therapy will not help him and denies need for assistive devices for safety. Daughter, who is a Marine scientist, was present for session and able to assist in giving recent PLOF as pt is a poor historian. No deficits in coordination, strength or sensation in R UE detected this visit, although pt reports "numbness" in his R hand. Pt may be referring to the transient period in which he dropped his coffee cup at home. Pt reporting poor balance in standing and with ambulation. Rn aware. Will follow acutely.  Of note, pt's wife only speaks [place].    Follow Up Recommendations  No OT follow up;Supervision/Assistance - 24 hour    Equipment Recommendations   Physical Therapy, Speech Therapy   Recommendations for Other Services       Precautions / Restrictions Precautions Precautions: Fall      Mobility Bed Mobility Overal bed mobility: Modified Independent                Transfers Overall transfer level: Needs assistance Equipment used: 1 person hand held assist Transfers: Sit to/from Stand Sit to Stand: Min guard              Balance Overall balance assessment: Needs assistance Sitting-balance support: Feet supported Sitting balance-Leahy Scale: Fair       Standing balance-Leahy Scale: Poor                              ADL Overall ADL's : Needs  assistance/impaired Eating/Feeding: Independent;Sitting Eating/Feeding Details (indicate cue type and reason): reports his coffee cup falling out of his hand 3 days ago Grooming: Wash/dry hands;Standing;Min guard   Upper Body Bathing: Min guard;Standing   Lower Body Bathing: Minimal assistance;Sit to/from stand   Upper Body Dressing : Set up;Sitting   Lower Body Dressing: Min guard;Sit to/from stand   Toilet Transfer: Minimal assistance;Ambulation Toilet Transfer Details (indicate cue type and reason): stood to urinate Toileting- Clothing Manipulation and Hygiene: Minimal assistance (standing)       Functional mobility during ADLs: Minimal assistance (hand held) General ADL Comments: Pt with impaired standing balance with toileting and standing ADL.     Vision     Perception     Praxis      Pertinent Vitals/Pain Pain Assessment: No/denies pain     Hand Dominance Right   Extremity/Trunk Assessment Upper Extremity Assessment Upper Extremity Assessment: Overall WFL for tasks assessed   Lower Extremity Assessment Lower Extremity Assessment: Overall WFL for tasks assessed       Communication Communication Communication: HOH   Cognition Arousal/Alertness: Awake/alert Behavior During Therapy: Impulsive Overall Cognitive Status: History of cognitive impairments - at baseline                 General Comments: hx of memory loss and poor awareness of deficits and safety per  daughter   General Comments       Exercises       Shoulder Instructions      Home Living Family/patient expects to be discharged to:: Private residence Living Arrangements: Spouse/significant other (wife speaks only [place]) Available Help at Discharge: Family;Available 24 hours/day Type of Home: House Home Access: Stairs to enter [employer] of Steps: 3 Entrance Stairs-Rails: Right Home Layout: Two level;Able to live on main level with bedroom/bathroom     Bathroom  Shower/Tub: Occupational psychologist: Standard     Home Equipment: Shower seat - built in   Additional Comments: pt may have a cane he used intermittently      Prior Functioning/Environment Level of Independence: Independent        Comments: per daughter, pt is non compliant and denies he is a fall risk.        OT Problem List: Impaired balance (sitting and/or standing);Decreased cognition;Decreased safety awareness;Decreased knowledge of use of DME or AE   OT Treatment/Interventions: Self-care/ADL training;DME and/or AE instruction;Patient/family education;Balance training    OT Goals(Current goals can be found in the care plan section) Acute Rehab OT Goals Patient Stated Goal: to get the neurologist to fix my head OT Goal Formulation: With patient Time For Goal Achievement: 08/26/16 Potential to Achieve Goals: Good ADL Goals Pt Will Perform Grooming: with supervision;standing Pt Will Perform Lower Body Bathing: with supervision;sit to/from stand Pt Will Perform Lower Body Dressing: with supervision;sit to/from stand Pt Will Transfer to Toilet: with supervision;ambulating;regular height toilet Pt Will Perform Toileting - Clothing Manipulation and hygiene: with supervision;sit to/from stand Pt Will Perform Tub/Shower Transfer: Shower transfer;with supervision;ambulating;shower seat Additional ADL Goal #1: Pt and wife will verbalize understanding of pt's fall risk and related safety precautions.  OT Frequency: Min 2X/week   Barriers to D/C:            Co-evaluation              End of Session Equipment Utilized During Treatment: Gait belt Nurse Communication: Mobility status (fall risk)  Activity Tolerance: Patient tolerated treatment well Patient left: in bed (going for CT scan)   Time: [id number] OT Time Calculation (min): 28 min Charges:  OT General Charges $OT Visit: 1 Procedure OT Evaluation $OT Eval Moderate Complexity: 1 Procedure OT  Treatments $Self Care/Home Management : 8-22 mins G-Codes: OT G-codes **NOT FOR INPATIENT CLASS** Functional Assessment Tool Used: clinical judgement Functional Limitation: Self care Self Care Current Status [id number]): At least 20 percent but less than 40 percent impaired, limited or restricted Self Care Goal Status [id number]): At least 1 percent but less than 20 percent impaired, limited or restricted  Malka So 08/19/2016, 4:26 PM  [phone]

## 2016-08-19 NOTE — Care Management Obs Status (Signed)
[facility] NOTIFICATION   Patient Details  Name: Shaun James MRN: [id number] Date of Birth: 01-21-1931   Medicare Observation Status Notification Given:  Yes    Pollie Friar, RN 08/19/2016, 2:33 PM

## 2016-08-20 ENCOUNTER — Encounter (HOSPITAL_COMMUNITY): Payer: Self-pay | Admitting: General Practice

## 2016-08-20 DIAGNOSIS — Z951 Presence of aortocoronary bypass graft: Secondary | ICD-10-CM | POA: Diagnosis not present

## 2016-08-20 DIAGNOSIS — M50322 Other cervical disc degeneration at C5-C6 level: Secondary | ICD-10-CM | POA: Diagnosis not present

## 2016-08-20 DIAGNOSIS — I700 Atherosclerosis of aorta: Secondary | ICD-10-CM | POA: Diagnosis not present

## 2016-08-20 DIAGNOSIS — I2510 Atherosclerotic heart disease of native coronary artery without angina pectoris: Secondary | ICD-10-CM | POA: Diagnosis not present

## 2016-08-20 DIAGNOSIS — Z8711 Personal history of peptic ulcer disease: Secondary | ICD-10-CM | POA: Diagnosis not present

## 2016-08-20 DIAGNOSIS — I671 Cerebral aneurysm, nonruptured: Secondary | ICD-10-CM | POA: Diagnosis not present

## 2016-08-20 DIAGNOSIS — I6502 Occlusion and stenosis of left vertebral artery: Secondary | ICD-10-CM | POA: Diagnosis not present

## 2016-08-20 DIAGNOSIS — I252 Old myocardial infarction: Secondary | ICD-10-CM | POA: Diagnosis not present

## 2016-08-20 DIAGNOSIS — K219 Gastro-esophageal reflux disease without esophagitis: Secondary | ICD-10-CM | POA: Diagnosis not present

## 2016-08-20 DIAGNOSIS — R4701 Aphasia: Secondary | ICD-10-CM | POA: Diagnosis not present

## 2016-08-20 DIAGNOSIS — I714 Abdominal aortic aneurysm, without rupture: Secondary | ICD-10-CM | POA: Diagnosis not present

## 2016-08-20 DIAGNOSIS — I480 Paroxysmal atrial fibrillation: Secondary | ICD-10-CM

## 2016-08-20 DIAGNOSIS — I482 Chronic atrial fibrillation: Secondary | ICD-10-CM | POA: Diagnosis not present

## 2016-08-20 DIAGNOSIS — Z86010 Personal history of colonic polyps: Secondary | ICD-10-CM | POA: Diagnosis not present

## 2016-08-20 DIAGNOSIS — I6522 Occlusion and stenosis of left carotid artery: Secondary | ICD-10-CM | POA: Diagnosis not present

## 2016-08-20 DIAGNOSIS — H8110 Benign paroxysmal vertigo, unspecified ear: Secondary | ICD-10-CM | POA: Diagnosis not present

## 2016-08-20 DIAGNOSIS — R29898 Other symptoms and signs involving the musculoskeletal system: Secondary | ICD-10-CM | POA: Diagnosis not present

## 2016-08-20 DIAGNOSIS — I4892 Unspecified atrial flutter: Secondary | ICD-10-CM | POA: Diagnosis not present

## 2016-08-20 DIAGNOSIS — Z9889 Other specified postprocedural states: Secondary | ICD-10-CM | POA: Diagnosis not present

## 2016-08-20 DIAGNOSIS — E782 Mixed hyperlipidemia: Secondary | ICD-10-CM | POA: Diagnosis not present

## 2016-08-20 DIAGNOSIS — N400 Enlarged prostate without lower urinary tract symptoms: Secondary | ICD-10-CM | POA: Diagnosis not present

## 2016-08-20 DIAGNOSIS — Z8611 Personal history of tuberculosis: Secondary | ICD-10-CM | POA: Diagnosis not present

## 2016-08-20 DIAGNOSIS — I10 Essential (primary) hypertension: Secondary | ICD-10-CM | POA: Diagnosis not present

## 2016-08-20 DIAGNOSIS — I350 Nonrheumatic aortic (valve) stenosis: Secondary | ICD-10-CM | POA: Diagnosis not present

## 2016-08-20 DIAGNOSIS — M1990 Unspecified osteoarthritis, unspecified site: Secondary | ICD-10-CM | POA: Diagnosis not present

## 2016-08-20 LAB — GLUCOSE, CAPILLARY: Glucose-Capillary: 107 mg/dL — ABNORMAL HIGH (ref 65–99)

## 2016-08-20 MED ORDER — ATORVASTATIN CALCIUM 80 MG PO TABS: 80.0000 mg | ORAL_TABLET | Freq: Every day | ORAL | Status: DC

## 2016-08-20 MED ORDER — ASPIRIN 81 MG PO CHEW
81.0000 mg | CHEWABLE_TABLET | Freq: Once | ORAL | Status: AC
  Administered 2016-08-20: 81 mg via ORAL
  Filled 2016-08-20: qty 1
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
## 2016-08-20 NOTE — Progress Notes (Signed)
Call report given charge nurse [facility], [facility], [facility] [phone].  [facility] notified and given report. [facility] stated no truck available at this time.  Camera operator at [facility] aware.  Patient alert, oriented resting in bed with spouse at bedside. No noted discomfort or distress. Will continue to monitor.

## 2016-08-20 NOTE — Progress Notes (Signed)
Pt no longer interested in transferring to another hospital this information was given to me by day nurse during report.

## 2016-08-20 NOTE — Progress Notes (Signed)
Patient ID: Shaun James, male   DOB: 04/08/1931, 82 y.o.   MRN: [id number] INR. Stable overnight neurologically. Presently says continues to have mild rt upper extremity weakness.Marland Kitchen Speech unchanged. O/E Less prominent Rt facial droop. Speech clear. RT UE 4+/5 grip strength. No pronation drift.. Plan. After further consideration and discussion the family has decided that they seek treatment at [facility].. Discussed with Dr Erlinda Hong ,stroke neurologist.. Arlean Hopping MD

## 2016-08-20 NOTE — Progress Notes (Signed)
Speech Language Pathology  Patient Details Name: Shaun James MRN: [id number] DOB: 1930-09-22 Today's Date: 08/20/2016 Time:  -      Speech-language-cognitive assessment received. Pt is being transferred to [facility] this afternoon. Will defer assessment to that facility.    Orbie Pyo Tibbie.Ed [employer] [phone]

## 2016-08-20 NOTE — Progress Notes (Addendum)
STROKE TEAM PROGRESS NOTE   HISTORY OF PRESENT ILLNESS (per record) Shaun James is an 82 y.o. 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 Patient reports that since his angioplasty in September, he has experienced worsening vision, hearing, weakness in his right hand, numbness in his three first digits of his right hand and difficult speech. He attributes a lot of his symptoms to a hematoma he developed in his right forearm at the IV site that was placed during his surgery in September. Vascular Ultra sound was normal. However in the past 3 days, patient states these symptoms have been worse as has been dropping things and unable to text with his right hand. He denies other weaknesses and denies numbness.   In September, his Eliquis was held for his interventions and patient was managed on ASA and Brilinta 45 mg daily. At a follow up visit with Cardiology on 06/09/16, patient was continued on Brilinta and Eliquis and instructed to stop ASA.   On review of an office note from 06/29/2016, patient complained to his primary care physician that he was experiencing right hand tightness that seemed to resolved on its own. On follow up on 08/17/16, he complained of numbness and weakness in his right hand and patient was referred to back to Neurosurgery, who on hearing his symptoms, instructed him to go the the ED.   On admission, patient was afebrile, satting well on room air, BP normotensive, normal RR, HR 50-60s. CBC and BMET normal. CT Head wo contrast showed no acute intracranial abnormality. CTA Head and Neck showed  similar mild narrowing left mid cervical internal carotid artery favoring old injury without flow limiting stenosis and left internal carotid artery pipeline stent with worsening proximal in stent stenosis suspected. 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  Date last known well: Date: 12/303/2017 Time last known well: Unable to determine tPA Given: No: Out of window No Symptoms                                                                                                 0 No significant disability/able to carry out all usual activities                           1 Unable to carry out all previous activities but looks after own affairs             2 Requires help but walks without assistance  3 Unable to walk without assistance/unable to handle own bodily needs         4 Bedridden/incontinent                                                                                     5 Dead                                                                                                               6  Modified Rankin: Rankin Score=1   SUBJECTIVE (INTERVAL HISTORY) His wife and friend are at the bedside. He is lying in bed and currently at his baseline. He has hard of hearing. I discussed with him about maximized medical treatment and keep BP at 130-150 to help his symptoms. I also agree with angiogram to look at in-stent stenosis but Dr. Estanislado Pandy can do it Sunday or Monday after transition from eliquis to heparin IV. He is now arranging to transfer to [facility] for potential angiogram in [facility].    OBJECTIVE Temp:  [97.7 F (36.5 C)-98.7 F (37.1 C)] 97.8 F (36.6 C) (12/08 0612) Pulse Rate:  [55-70] 57 (12/08 0612) Cardiac Rhythm: Heart block (12/07 1900) Resp:   [16-19] 16 (12/08 0612) BP: (103-143)/(48-79) 131/65 (12/08 0612) SpO2:  [95 %-98 %] 95 % (12/08 0612)  CBC:  Recent Labs Lab 08/18/16 1712  WBC 5.2  NEUTROABS 2.4  HGB 13.8  HCT 39.5  MCV 89.4  PLT 123456    Basic Metabolic Panel:  Recent Labs Lab 08/18/16 1712  NA 137  K 4.0  CL 106  CO2 23  GLUCOSE 130*  BUN 11  CREATININE 0.98  CALCIUM 9.0    Lipid Panel:    Component Value Date/Time   CHOL 120 08/19/2016 0404   TRIG 198 (H) 08/19/2016 0404   HDL 33 (L) 08/19/2016 0404   CHOLHDL 3.6 08/19/2016 0404   VLDL 40 08/19/2016 0404   LDLCALC 47 08/19/2016 0404   HgbA1c:  Lab Results  Component Value Date   HGBA1C 5.4 08/19/2016   Urine Drug Screen:    Component Value Date/Time   LABOPIA NONE DETECTED 03/10/2016 1358   COCAINSCRNUR NONE DETECTED 03/10/2016 1358   LABBENZ NONE DETECTED 03/10/2016 1358   AMPHETMU NONE DETECTED 03/10/2016 1358   THCU NONE DETECTED 03/10/2016 1358   LABBARB NONE DETECTED 03/10/2016 1358      IMAGING I have personally reviewed the radiological images below and agree with the radiology interpretations.  Ct Angio Head and neck W/cm &/or Wo Cm 08/19/2016 IMPRESSION: CTA NECK: Similar mild narrowing LEFT mid cervical internal carotid artery favors old injury without flow limiting stenosis. Atherosclerosis without hemodynamically significant stenosis. CTA HEAD: LEFT internal carotid artery  pipeline stent with worsening proximal in stent stenosis suspected. In addition, severe stenosis LEFT cavernous internal carotid artery secondary to atherosclerosis. Suspected stenosis LEFT M1 origin at the level of the stent. Similar 3 x 3 mm LEFT supraclinoid internal carotid artery aneurysm. Stable moderate stenosis LEFT V4 origin.   Ct Head Wo Contrast 08/18/2016 IMPRESSION: 1. Stable exam.  No acute intracranial abnormality. 2. Atrophy with chronic small vessel white matter ischemic disease.   Ct Cerebral Perfusion W  Contrast 08/19/2016 IMPRESSION: Abnormal perfusion characteristics in the left MCA territory likely corresponding to stenosis of the left MCA M1 demonstrated earlier today by CTA. CT perfusion does not detect any infarct core at this time.    CUS - The vertebral arteries appear patent with antegrade flow. - Findings consistent with 1- 39 percent stenosis involving the   right internal carotid artery and the left internal carotid   artery. Can not rule out higher grade stenosis of left internal   carotid artery due to large amount of calcified shadowing plaque.   PHYSICAL EXAM Physical exam  Temp:  [97.7 F (36.5 C)-98.7 F (37.1 C)] 98.6 F (37 C) (12/08 1221) Pulse Rate:  [55-70] 70 (12/08 1221) Resp:  [16-19] 17 (12/08 1016) BP: (103-156)/(48-74) 155/68 (12/08 1221) SpO2:  [95 %-98 %] 96 % (12/08 1221)  General - Well nourished, well developed, in no apparent distress.  Ophthalmologic - Fundi not visualized due to eye movement.  Cardiovascular - Regular rate and rhythm.  Mental Status -  Level of arousal and orientation to time, place, and person were intact. Language including expression, naming, repetition, comprehension was assessed and found intact. Fund of Knowledge was assessed and was intact.  Cranial Nerves II - XII - II - Visual field intact OU. III, IV, VI - Extraocular movements intact. V - Facial sensation intact bilaterally. VII - Facial movement intact bilaterally. VIII - Hearing & vestibular intact bilaterally. X - Palate elevates symmetrically. XI - Chin turning & shoulder shrug intact bilaterally. XII - Tongue protrusion intact  Motor Strength - The patient's strength was normal in all extremities and pronator drift was absent.  Bulk was normal and fasciculations were absent.   Motor Tone - Muscle tone was assessed at the neck and appendages and was normal.  Reflexes - The patient's reflexes were 1+ in all extremities and he had no pathological  reflexes.  Sensory - Light touch, temperature/pinprick were assessed and were symmetrical.    Coordination - The patient had normal movements in the hands and feet with no ataxia or dysmetria.  Tremor was absent.  Gait and Station - deferred   ASSESSMENT/PLAN Shaun James is a 82 y.o. male with history of CAD s/p CABG, afib on Eliquis, SSS s/p PPM, PCOM cerebral aneurysm with left ICA to MCA flow diverticular along with left ICA stenosis with recent angioplasty in 05/2016 presenting with wax and wane right arm weakness and word finding difficulty for 3 days. He did not receive IV t-PA due to out of window.   TIA:  Dominant left brain TIA - likely BP dependent due to left terminal ICA stenosis proximal to stent vs. In stent stenosis.  Resultant  Wax and wane right arm weakness and word finding difficulty, now resolved  MRI/MRA  Pacer  CTA head and neck possible worsening ICA stenosis proximal to stent. In stent stenosis also possible.   CTP no core infarct, left slow CBF is expected   Carotid Doppler  unremarkable  Agree with  cerebral angio to look for ICA stenosis and in stent stenosis, pt currently would like to be done in [facility]  LDL 47  HgbA1c 5.4  eliquis now for VTE prophylaxis  Diet Heart Room service appropriate? Yes; Fluid consistency: Thin  Eliquis (apixaban) daily prior to admission, now on aspirin 81 mg daily and Eliquis (apixaban) daily. Pt weight is borderline for decreased dose of eliquis. Agree with addition of ASA 81mg  for stroke and cardiac prevention. Will up to interventional neuroradiologist and cardiology for antiplatelet regimen.  Patient counseled to be compliant with his antithrombotic medications  Ongoing aggressive stroke risk factor management  Therapy recommendations:  pending  Disposition:  Transfer to [facility]  Intracranial carotid stenosis  Left terminal ICA stenosis  PCOM cerebral aneurysm with left ICA to MCA flow diverticular along with  left ICA stenosis with recent angioplasty in 05/2016   Agree with angiogram to evaluate stenosis and ICA/MCA stent. Pt would like it to be done in [facility]  Need continue eliquis and antiplatelet and high dose lipitor for maximized therapy  BP goal 130-150  D/c home BP meds  Avoid hypotension  afib  On eliquis 2.5mg  bid  Borderline weight to justify lower dose of eliquis. As pt also on antiplatelet, agree with eliquis 2.5mg  bid  Hypertension  Stable  Permissive hypertension (OK if < 220/120) but gradually normalize in 5-7 days  Long-term BP goal 130-150  D/c home BP meds for now Avoid hypotension  Hyperlipidemia  Home meds:  zocor 20  LDL 47, goal < 70  Changed to lipitor 80 for maximized medical treatment  Continue statin at discharge  Other Stroke Risk Factors  Advanced age   Other Active Problems  SSS s/p pacer  Hard of hearing  Hospital day # 0  The patient is at risk for recurrent strokes and TIAs and neurological worsening and he needs ongoing stroke evaluation and aggressive risk factor modification. I have reviewed images, discussed with Dr. Estanislado Pandy, Dr. Candiss Norse, Dr. Tobias Alexander about his care. I have spent fair amount of time at bedside updating him about his diagnosis, treatment options and prognosis. He expressed understanding and appreciation for my discussion with him.  Neurology will sign off. Please call with questions. Pt will follow up with [facility] neurology. Thanks for the consult.  Rosalin Hawking, MD PhD Stroke Neurology 08/20/2016 1:16 PM   To contact Stroke Continuity provider, please refer to [email or web address] After hours, contact General Neurology

## 2016-08-20 NOTE — Discharge Instructions (Signed)
Follow with Primary MD Binnie Rail, MD in 7 days   Get CBC, CMP, 2 view Chest X ray checked  by Primary MD or SNF MD in 5-7 days ( we routinely change or add medications that can affect your baseline labs and fluid status, therefore we recommend that you get the mentioned basic workup next visit with your PCP, your PCP may decide not to get them or add new tests based on their clinical decision)   Activity: As tolerated with Full fall precautions use walker/cane & assistance as needed   Disposition Home     Diet: Heart Healthy.  For Heart failure patients - Check your Weight same time everyday, if you gain over 2 pounds, or you develop in leg swelling, experience more shortness of breath or chest pain, call your Primary MD immediately. Follow Cardiac Low Salt Diet and 1.5 lit/day fluid restriction.   On your next visit with your primary care physician please Get Medicines reviewed and adjusted.   Please request your Prim.MD to go over all Hospital Tests and Procedure/Radiological results at the follow up, please get all Hospital records sent to your Prim MD by signing hospital release before you go home.   If you experience worsening of your admission symptoms, develop shortness of breath, life threatening emergency, suicidal or homicidal thoughts you must seek medical attention immediately by calling 911 or calling your MD immediately  if symptoms less severe.  You Must read complete instructions/literature along with all the possible adverse reactions/side effects for all the Medicines you take and that have been prescribed to you. Take any new Medicines after you have completely understood and accpet all the possible adverse reactions/side effects.   Do not drive, operate heavy machinery, perform activities at heights, swimming or participation in water activities or provide baby sitting services if your were admitted for syncope or siezures until you have seen by Primary MD or a  Neurologist and advised to do so again.  Do not drive when taking Pain medications.    Do not take more than prescribed Pain, Sleep and Anxiety Medications  Special Instructions: If you have smoked or chewed Tobacco  in the last 2 yrs please stop smoking, stop any regular Alcohol  and or any Recreational drug use.  Wear Seat belts while driving.   Please note  You were cared for by a hospitalist during your hospital stay. If you have any questions about your discharge medications or the care you received while you were in the hospital after you are discharged, you can call the unit and asked to speak with the hospitalist on call if the hospitalist that took care of you is not available. Once you are discharged, your primary care physician will handle any further medical issues. Please note that NO REFILLS for any discharge medications will be authorized once you are discharged, as it is imperative that you return to your primary care physician (or establish a relationship with a primary care physician if you do not have one) for your aftercare needs so that they can reassess your need for medications and monitor your lab values. _________________________________________________________________________________________________________________________________________ Information on my medicine - ELIQUIS (apixaban)  This medication education was reviewed with me or my healthcare representative as part of my discharge preparation.  The pharmacist that spoke with me during my hospital stay was:  Einar Grad, [facility]  Why was Eliquis prescribed for you? Eliquis was prescribed for you to reduce the risk of a blood clot  forming that can cause a stroke if you have a medical condition called atrial fibrillation (a type of irregular heartbeat).  What do You need to know about Eliquis ? Take your Eliquis TWICE DAILY - one tablet in the morning and one tablet in the evening with or without food. If  you have difficulty swallowing the tablet whole please discuss with your pharmacist how to take the medication safely.  Take Eliquis exactly as prescribed by your doctor and DO NOT stop taking Eliquis without talking to the doctor who prescribed the medication.  Stopping may increase your risk of developing a stroke.  Refill your prescription before you run out.  After discharge, you should have regular check-up appointments with your healthcare provider that is prescribing your Eliquis.  In the future your dose may need to be changed if your kidney function or weight changes by a significant amount or as you get older.  What do you do if you miss a dose? If you miss a dose, take it as soon as you remember on the same day and resume taking twice daily.  Do not take more than one dose of ELIQUIS at the same time to make up a missed dose.  Important Safety Information A possible side effect of Eliquis is bleeding. You should call your healthcare provider right away if you experience any of the following: ? Bleeding from an injury or your nose that does not stop. ? Unusual colored urine (red or dark brown) or unusual colored stools (red or black). ? Unusual bruising for unknown reasons. ? A serious fall or if you hit your head (even if there is no bleeding).  Some medicines may interact with Eliquis and might increase your risk of bleeding or clotting while on Eliquis. To help avoid this, consult your healthcare provider or pharmacist prior to using any new prescription or non-prescription medications, including herbals, vitamins, non-steroidal anti-inflammatory drugs (NSAIDs) and supplements.  This website has more information on Eliquis (apixaban): http://www.eliquis.com/eliquis/home

## 2016-08-20 NOTE — Progress Notes (Signed)
Patient left with [facility] by stretcher with all belongings. Discharge instructions and all other paper work given.  Family at side. All questions and concerns addressed.

## 2016-09-11 ENCOUNTER — Emergency Department (HOSPITAL_COMMUNITY)
Admission: EM | Admit: 2016-09-11 | Discharge: 2016-09-11 | Disposition: A | Discharge status: Home / Self Care | Payer: Medicare Other | Attending: Emergency Medicine | Admitting: Emergency Medicine

## 2016-09-11 ENCOUNTER — Emergency Department (HOSPITAL_COMMUNITY): Payer: Medicare Other

## 2016-09-11 ENCOUNTER — Other Ambulatory Visit: Payer: Self-pay | Admitting: Internal Medicine

## 2016-09-11 ENCOUNTER — Encounter (HOSPITAL_COMMUNITY): Payer: Self-pay | Admitting: Emergency Medicine

## 2016-09-11 DIAGNOSIS — I2510 Atherosclerotic heart disease of native coronary artery without angina pectoris: Secondary | ICD-10-CM | POA: Diagnosis not present

## 2016-09-11 DIAGNOSIS — Z950 Presence of cardiac pacemaker: Secondary | ICD-10-CM | POA: Insufficient documentation

## 2016-09-11 DIAGNOSIS — N3001 Acute cystitis with hematuria: Secondary | ICD-10-CM | POA: Diagnosis not present

## 2016-09-11 DIAGNOSIS — I10 Essential (primary) hypertension: Secondary | ICD-10-CM | POA: Insufficient documentation

## 2016-09-11 DIAGNOSIS — Z951 Presence of aortocoronary bypass graft: Secondary | ICD-10-CM | POA: Insufficient documentation

## 2016-09-11 DIAGNOSIS — Z79899 Other long term (current) drug therapy: Secondary | ICD-10-CM | POA: Insufficient documentation

## 2016-09-11 DIAGNOSIS — Z87891 Personal history of nicotine dependence: Secondary | ICD-10-CM | POA: Insufficient documentation

## 2016-09-11 DIAGNOSIS — I252 Old myocardial infarction: Secondary | ICD-10-CM | POA: Diagnosis not present

## 2016-09-11 DIAGNOSIS — Z7901 Long term (current) use of anticoagulants: Secondary | ICD-10-CM | POA: Insufficient documentation

## 2016-09-11 DIAGNOSIS — R509 Fever, unspecified: Secondary | ICD-10-CM | POA: Diagnosis present

## 2016-09-11 LAB — URINALYSIS, ROUTINE W REFLEX MICROSCOPIC
Bilirubin Urine: NEGATIVE
Glucose, UA: NEGATIVE mg/dL
HGB URINE DIPSTICK: NEGATIVE
Ketones, ur: 5 mg/dL — AB
NITRITE: POSITIVE — AB
PROTEIN: NEGATIVE mg/dL
SPECIFIC GRAVITY, URINE: 1.013 (ref 1.005–1.030)
Squamous Epithelial / LPF: NONE SEEN
pH: 6 (ref 5.0–8.0)

## 2016-09-11 LAB — CBC WITH DIFFERENTIAL/PLATELET
BASOS PCT: 0 %
Basophils Absolute: 0 10*3/uL (ref 0.0–0.1)
EOS ABS: 0.1 10*3/uL (ref 0.0–0.7)
Eosinophils Relative: 0 %
HCT: 41.5 % (ref 39.0–52.0)
HEMOGLOBIN: 14.6 g/dL (ref 13.0–17.0)
LYMPHS ABS: 1.2 10*3/uL (ref 0.7–4.0)
Lymphocytes Relative: 7 %
MCH: 31.1 pg (ref 26.0–34.0)
MCHC: 35.2 g/dL (ref 30.0–36.0)
MCV: 88.5 fL (ref 78.0–100.0)
MONO ABS: 0.8 10*3/uL (ref 0.1–1.0)
MONOS PCT: 5 %
Neutro Abs: 14.8 10*3/uL — ABNORMAL HIGH (ref 1.7–7.7)
Neutrophils Relative %: 88 %
Platelets: 212 10*3/uL (ref 150–400)
RBC: 4.69 MIL/uL (ref 4.22–5.81)
RDW: 13.5 % (ref 11.5–15.5)
WBC: 17 10*3/uL — ABNORMAL HIGH (ref 4.0–10.5)

## 2016-09-11 LAB — COMPREHENSIVE METABOLIC PANEL
ALBUMIN: 4 g/dL (ref 3.5–5.0)
ALK PHOS: 66 U/L (ref 38–126)
ALT: 14 U/L — AB (ref 17–63)
AST: 21 U/L (ref 15–41)
Anion gap: 8 (ref 5–15)
BILIRUBIN TOTAL: 1.1 mg/dL (ref 0.3–1.2)
BUN: 8 mg/dL (ref 6–20)
CALCIUM: 9.3 mg/dL (ref 8.9–10.3)
CO2: 22 mmol/L (ref 22–32)
CREATININE: 0.95 mg/dL (ref 0.61–1.24)
Chloride: 104 mmol/L (ref 101–111)
GFR calc Af Amer: >60 mL/min (ref >60)
GFR calc non Af Amer: >60 mL/min (ref >60)
GLUCOSE: 130 mg/dL — AB (ref 65–99)
Potassium: 3.8 mmol/L (ref 3.5–5.1)
SODIUM: 134 mmol/L — AB (ref 135–145)
TOTAL PROTEIN: 7.5 g/dL (ref 6.5–8.1)

## 2016-09-11 LAB — I-STAT CG4 LACTIC ACID, ED: Lactic Acid, Venous: 0.91 mmol/L (ref 0.5–1.9)

## 2016-09-11 LAB — INFLUENZA PANEL BY PCR (TYPE A & B)
INFLAPCR: NEGATIVE
Influenza B By PCR: NEGATIVE

## 2016-09-11 MED ORDER — SODIUM CHLORIDE 0.9 % IV BOLUS (SEPSIS)
1000.0000 mL | Freq: Once | INTRAVENOUS | Status: AC
  Administered 2016-09-11: 1000 mL via INTRAVENOUS

## 2016-09-11 MED ORDER — CEFTRIAXONE SODIUM 1 G IJ SOLR
1.0000 g | Freq: Once | INTRAMUSCULAR | Status: AC
  Administered 2016-09-11: 1 g via INTRAVENOUS
  Filled 2016-09-11: qty 10

## 2016-09-11 MED ORDER — CEPHALEXIN 500 MG PO CAPS: 500.0000 mg | ORAL_CAPSULE | Freq: Two times a day (BID) | ORAL | 0 refills | Status: DC

## 2016-09-11 NOTE — Discharge Instructions (Signed)
Please follow with your primary care doctor in the next 2 days for a check-up. They must obtain records for further management.  ° °Do not hesitate to return to the Emergency Department for any new, worsening or concerning symptoms.  ° °

## 2016-09-11 NOTE — ED Notes (Signed)
Pt was able to ambulate in the hall w/o any difficulties

## 2016-09-11 NOTE — ED Notes (Signed)
Patient transported to X-ray 

## 2016-09-11 NOTE — ED Provider Notes (Signed)
8:48 PM Handoff from [place] PA-C at shift change, pending ambulation.   Patient has ambulated, is ready for discharge.   BP 130/67   Pulse 86   Temp 98.3 F (36.8 C) (Rectal)   Resp 20   Ht 5\' 4"  (1.626 m)   Wt 62.6 kg   SpO2 96%   BMI 23.69 kg/m     Carlisle Cater, PA-C 09/11/16 2048    Davonna Belling, MD 09/11/16 [phone]

## 2016-09-11 NOTE — ED Triage Notes (Signed)
Pt here with grandson-- with c/o onset weakness and confusion this am-- started at 1000-- states "I feel sick" -- pt is alert/oriented x 4,  Family states pt is not acting himself. Hx of aneurysm
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
## 2016-09-13 NOTE — Patient Instructions (Addendum)
  All other Health Maintenance issues reviewed.   All recommended immunizations and age-appropriate screenings are up-to-date or discussed.  No immunizations administered today.   Medications reviewed and updated.  No changes recommended at this time.   A referral was ordered for orthopedics for your right arm symptoms.   Please followup in 6 months

## 2016-09-14 ENCOUNTER — Ambulatory Visit (INDEPENDENT_AMBULATORY_CARE_PROVIDER_SITE_OTHER): Payer: Medicare Other | Admitting: Internal Medicine

## 2016-09-14 ENCOUNTER — Encounter: Payer: Self-pay | Admitting: Internal Medicine

## 2016-09-14 VITALS — BP 144/86 | HR 78 | Temp 97.8°F | Resp 18 | Ht 63.0 in | Wt 131.0 lb

## 2016-09-14 DIAGNOSIS — R202 Paresthesia of skin: Secondary | ICD-10-CM

## 2016-09-14 DIAGNOSIS — R200 Anesthesia of skin: Secondary | ICD-10-CM

## 2016-09-14 DIAGNOSIS — N3000 Acute cystitis without hematuria: Secondary | ICD-10-CM

## 2016-09-14 DIAGNOSIS — I671 Cerebral aneurysm, nonruptured: Secondary | ICD-10-CM | POA: Diagnosis not present

## 2016-09-14 DIAGNOSIS — R42 Dizziness and giddiness: Secondary | ICD-10-CM | POA: Diagnosis not present

## 2016-09-14 DIAGNOSIS — N390 Urinary tract infection, site not specified: Secondary | ICD-10-CM | POA: Insufficient documentation

## 2016-09-14 LAB — URINE CULTURE: Culture: >=100000 — AB

## 2016-09-14 NOTE — Assessment & Plan Note (Signed)
will complete keflex Asymptomatic Sensitivity confirms keflex is effective No follow up needed

## 2016-09-14 NOTE — Assessment & Plan Note (Signed)
Stent restenosis after brinlinta stopped Following with neurosurgery at [facility] - has two procedures to reopen stent Will follow up with neurosurgery

## 2016-09-14 NOTE — Assessment & Plan Note (Signed)
?   Nerve damage or entrapment related to IV attempt in right forearm Will refer to orthopedics

## 2016-09-14 NOTE — Progress Notes (Signed)
Pre visit review using our clinic review tool, if applicable. No additional management support is needed unless otherwise documented below in the visit note. 

## 2016-09-14 NOTE — Assessment & Plan Note (Signed)
?   Related to h/o cva or cerebral aneurysm Will discuss with neurosurgery later this month when he follows up with them

## 2016-09-15 ENCOUNTER — Telehealth (HOSPITAL_BASED_OUTPATIENT_CLINIC_OR_DEPARTMENT_OTHER): Payer: Self-pay | Admitting: Emergency Medicine

## 2016-09-15 ENCOUNTER — Ambulatory Visit (INDEPENDENT_AMBULATORY_CARE_PROVIDER_SITE_OTHER): Payer: Medicare Other | Admitting: *Deleted

## 2016-09-15 DIAGNOSIS — I495 Sick sinus syndrome: Secondary | ICD-10-CM | POA: Diagnosis not present

## 2016-09-15 NOTE — Telephone Encounter (Signed)
Post ED Visit - Positive Culture Follow-up  Culture report reviewed by antimicrobial stewardship pharmacist:  []  Elenor Quinones, Pharm.D. []  Heide Guile, Pharm.D., BCPS []  Parks Neptune, Pharm.D. []  Alycia Rossetti, Pharm.D., BCPS []  Carney, Pharm.D., BCPS, AAHIVP []  Legrand Como, Pharm.D., BCPS, AAHIVP []  Milus Glazier, Pharm.D. []  Stephens November, Pharm.D.  Positive urine culture Treated with cephalexin, organism sensitive to the same and no further patient follow-up is required at this time.  Hazle Nordmann 09/15/2016, 12:35 PM

## 2016-09-16 LAB — CULTURE, BLOOD (ROUTINE X 2)
Culture: NO GROWTH
Culture: NO GROWTH

## 2016-09-16 NOTE — Progress Notes (Signed)
Remote pacemaker transmission.   

## 2016-09-17 ENCOUNTER — Encounter: Payer: Self-pay | Admitting: Cardiology

## 2016-09-30 ENCOUNTER — Encounter: Payer: Medicare Other | Admitting: Neurology

## 2016-10-02 LAB — CUP PACEART REMOTE DEVICE CHECK
Battery Remaining Percentage: 93 %
Brady Statistic RA Percent Paced: 4 %
Brady Statistic RV Percent Paced: 9 %
Implantable Lead Implant Date: 20121214
Implantable Lead Location: 753859
Implantable Lead Model: 4136
Implantable Lead Serial Number: 29098858
Lead Channel Impedance Value: 583 Ohm
Lead Channel Impedance Value: 834 Ohm
Lead Channel Pacing Threshold Amplitude: 0.8 V
Lead Channel Pacing Threshold Pulse Width: 0.4 ms
Lead Channel Setting Pacing Amplitude: 1 V
MDC IDC LEAD IMPLANT DT: 20121214
MDC IDC LEAD LOCATION: 753860
MDC IDC LEAD SERIAL: 29066260
MDC IDC MSMT BATTERY REMAINING LONGEVITY: 60 mo
MDC IDC MSMT LEADCHNL RV PACING THRESHOLD AMPLITUDE: 0.6 V
MDC IDC MSMT LEADCHNL RV PACING THRESHOLD PULSEWIDTH: 0.4 ms
MDC IDC PG IMPLANT DT: 20121214
MDC IDC SESS DTM: 20180103134900
MDC IDC SET LEADCHNL RA PACING AMPLITUDE: 2 V
MDC IDC SET LEADCHNL RV PACING PULSEWIDTH: 0.4 ms
MDC IDC SET LEADCHNL RV SENSING SENSITIVITY: 2.5 mV
Pulse Gen Serial Number: 118262

## 2016-10-04 ENCOUNTER — Other Ambulatory Visit: Payer: Self-pay | Admitting: *Deleted

## 2016-10-04 DIAGNOSIS — G5601 Carpal tunnel syndrome, right upper limb: Secondary | ICD-10-CM

## 2016-10-05 ENCOUNTER — Ambulatory Visit (INDEPENDENT_AMBULATORY_CARE_PROVIDER_SITE_OTHER): Payer: Medicare Other | Admitting: Neurology

## 2016-10-05 DIAGNOSIS — M5412 Radiculopathy, cervical region: Secondary | ICD-10-CM

## 2016-10-05 NOTE — Procedures (Signed)
[facility]  [address]  [place], [place] [address] Tel: [phone] Fax:  [phone] Test Date:  10/05/2016  Patient: Shaun James DOB: [birth year] Physician: Narda Amber, DO  Sex: Male Height: 5\' 4"  Ref Phys: Edmonia Lynch, M.D.  ID#: [id number] Temp: 33.2C Technician: Jerilynn Mages. Dean   Patient Complaints: This is a 83 year old gentleman with history of cerebral aneurysm s/p stenting referred for evaluation of right arm pain and numbness over the fourth and fifth digits, following venipuncture.   NCV & EMG Findings: Extensive electrodiagnostic testing of the right upper extremity and additional studies of the left shows:  1. Right median, ulnar, dorsal ulnar cutaneous, and mixed palmer responses are within normal limits. Bilateral medial antebrachial cutaneous sensory responses are symmetrically reduced and age-appropriate. 2. Right median and ulnar motor responses are within normal limits. 3. Chronic motor axon loss changes are seen affecting the C8 myotomes on the right, without accompanied active denervation.  Impression: 1. Chronic C8 radiculopathy affecting the right upper extremity, very mild in degree electrically. 2. There is no evidence of an ulnar neuropathy or carpal tunnel syndrome affecting the right upper extremity.   ___________________________ Narda Amber, DO    Nerve Conduction Studies Anti Sensory Summary Table   Stim Site NR Peak (ms) Norm Peak (ms) P-T Amp (V) Norm P-T Amp  Right DorsCutan Anti Sensory (Dorsum 5th MC)  Wrist    2.4 <3.2 11.4 >5  Left Med Ante Brach Cutan Anti Sensory (Med Forearm)  34.5C  Elbow    2.3  4.1   Right Med Ante Brach Cutan Anti Sensory (Med Forearm)  34.5C  Elbow    2.1  3.6   Site 2    2.1  4.2   Site 3    2.2  4.1   Site 4    2.3  3.6   Right Median Anti Sensory (2nd Digit)  Wrist    3.4 <3.8 16.0 >10  Left Ulnar Anti Sensory (5th Digit)  34.5C  Wrist    2.8 <3.2 9.9 >5  Right Ulnar Anti Sensory  (5th Digit)  34.5C  Wrist    2.8 <3.2 14.7 >5   Motor Summary Table   Stim Site NR Onset (ms) Norm Onset (ms) O-P Amp (mV) Norm O-P Amp Site1 Site2 Delta-0 (ms) Dist (cm) Vel (m/s) Norm Vel (m/s)  Right Median Motor (Abd Poll Brev)  Wrist    3.1 <4.0 7.4 >5 Elbow Wrist 3.8 22.0 58 >50  Elbow    6.9  6.7         Right Ulnar Motor (Abd Dig Minimi)  Wrist    2.7 <3.1 7.3 >7 B Elbow Wrist 3.6 20.0 56 >50  B Elbow    6.3  6.7  A Elbow B Elbow 1.7 10.0 59 >50  A Elbow    8.0  6.0         Right Ulnar (FDI) Motor (1st DI)  Wrist    4.1 <4.5 12.5 >7 B Elbow Wrist 3.6 20.0 56 >50  B Elbow    7.7  11.9  A Elbow B Elbow 1.7 10.0 59 >50  A Elbow    9.4  11.9          Comparison Summary Table   Stim Site NR Peak (ms) Norm Peak (ms) P-T Amp (V) Site1 Site2 Delta-P (ms) Norm Delta (ms)  Right Median/Ulnar Palm Comparison (Wrist - 8cm)  34.5C  Median Palm    1.7 <2.2 88.2 Median  Palm Ulnar Palm 0.3   Ulnar Palm    2.0 <2.2 8.1       EMG   Side Muscle Ins Act Fibs Psw Fasc Number Recrt Dur Dur. Amp Amp. Poly Poly. Comment  Right 1stDorInt Nml Nml Nml Nml 1- Rapid Few 1+ Few 1+ Nml Nml N/A  Right ABD Dig Min Nml Nml Nml Nml Nml Nml Nml Nml Nml Nml Nml Nml N/A  Right Ext Indicis Nml Nml Nml Nml 1- Rapid Few 1+ Few 1+ Nml Nml N/A  Right PronatorTeres Nml Nml Nml Nml Nml Nml Nml Nml Nml Nml Nml Nml N/A  Right Biceps Nml Nml Nml Nml Nml Nml Nml Nml Nml Nml Nml Nml N/A  Right Triceps Nml Nml Nml Nml 1- Rapid Few 1+ Few 1+ Nml Nml N/A  Right Deltoid Nml Nml Nml Nml Nml Nml Nml Nml Nml Nml Nml Nml N/A      Waveforms:

## 2016-10-06 ENCOUNTER — Encounter: Payer: Self-pay | Admitting: Cardiology

## 2016-10-08 ENCOUNTER — Ambulatory Visit (INDEPENDENT_AMBULATORY_CARE_PROVIDER_SITE_OTHER): Payer: Medicare Other | Admitting: Interventional Cardiology

## 2016-10-08 ENCOUNTER — Encounter: Payer: Self-pay | Admitting: Interventional Cardiology

## 2016-10-08 VITALS — BP 110/60 | HR 78 | Ht 63.0 in | Wt 132.0 lb

## 2016-10-08 DIAGNOSIS — I714 Abdominal aortic aneurysm, without rupture, unspecified: Secondary | ICD-10-CM

## 2016-10-08 DIAGNOSIS — I10 Essential (primary) hypertension: Secondary | ICD-10-CM | POA: Diagnosis not present

## 2016-10-08 DIAGNOSIS — I495 Sick sinus syndrome: Secondary | ICD-10-CM | POA: Diagnosis not present

## 2016-10-08 DIAGNOSIS — I480 Paroxysmal atrial fibrillation: Secondary | ICD-10-CM | POA: Diagnosis not present

## 2016-10-08 DIAGNOSIS — I671 Cerebral aneurysm, nonruptured: Secondary | ICD-10-CM

## 2016-10-08 DIAGNOSIS — I25718 Atherosclerosis of autologous vein coronary artery bypass graft(s) with other forms of angina pectoris: Secondary | ICD-10-CM | POA: Diagnosis not present

## 2016-10-08 DIAGNOSIS — Z950 Presence of cardiac pacemaker: Secondary | ICD-10-CM

## 2016-10-08 DIAGNOSIS — E785 Hyperlipidemia, unspecified: Secondary | ICD-10-CM

## 2016-10-08 DIAGNOSIS — M5412 Radiculopathy, cervical region: Secondary | ICD-10-CM | POA: Insufficient documentation

## 2016-10-08 NOTE — Progress Notes (Signed)
Cardiology Office Note    Date:  10/08/2016   ID:  MONTAGUE TYLUTKI, DOB [birth year], MRN [id number]  PCP:  Binnie Rail, MD  Cardiologist: Sinclair Grooms, MD   Chief Complaint  Patient presents with  . Coronary Artery Disease  . Atrial Fibrillation  . Anticoagulation    Brilinta and Eliquis    History of Present Illness:  Shaun James is a 83 y.o. male who presents for CADWith prior coronary bypass grafting, Paroxysmal atrial fibrillation/flutter, sick sinus syndrome with pacemaker, and hypertension. Recently diagnosed with a brain aneurysm treated with a left internal carotid artery to middle cerebral artery flow diverter by interventional radiology for a wide neck supraclinoid aneurysm with 50% residual stenosis. Subsequently had in-stent restenosis that required angioplasty of the left dural internal carotid at [facility] in December 2017 because of left brain ischemic syndrome. Dual agent anticoagulation with Eliquis and Brilinta.  He is very disturbed that he had a TIA/cerebral ischemic syndrome in December. This was related to in-stent restenosis in the left intradural carotid system. He refused to come back to the [facility] system and went to [facility] where PTCA was performed decreasing the internal diameter of the stent from 0.5-1.5 mm. He has been asymptomatic since that time. Brilinta dose was increased from 45 mg twice a day to 90 mg twice a day. He will be following up with the neurovascular interventional team in [place] soon.  In the L arm he has had no cardiac complaints. During his illness in December he did not have chest discomfort, palpitations, dyspnea, or syncope. No bleeding complications on dual antiplatelet and anticoagulation therapy.   Past Medical History:  Diagnosis Date  . AAA (abdominal aortic aneurysm) (HCC)    3.1 cm AAA, reevaluated 08/2009 per ultrasound - stable  . Allergic rhinitis   . Aortic sclerosis    Probable AS on physical exam, 2010  . Aortic stenosis    mild AS 2014 echo  . Arthritis   . Atrial fibrillation (HCC)    chronic anticoag  . Atrial flutter ([place])   . Basal cell carcinoma   . Benign positional vertigo   . BPH (benign prostatic hyperplasia)   . CAD in native artery    a) s/p CABG '98; LIMA-LAD, SVG-D1, SVG-OM1, SVG-PDA); b) CATH -2009: midLAD & RCA 100%, OM2 100% wtih severe native Cx; Grafts patent with ~30-50% SVG-D1 & ~30-40% SVG-OM, EF 45%; c) Lexiscan Cardiolite 02/2014: EF 61%, No Ischemia; subtle fixed anteroseptal defect.  . Cardiac pacemaker in [place]  . Coronary atherosclerosis of native coronary artery   . Diverticulosis of colon (without mention of hemorrhage)   . DJD (degenerative joint disease)   . Esophageal reflux   . Essential hypertension, benign   . GERD (gastroesophageal reflux disease)   . Headache    after brain aneurysm  . History of tuberculosis    remote Hx  . Inflamed seborrheic keratosis   . Intermittent confusion   . Internal hemorrhoids    severe  . Mixed hyperlipidemia   . Mouth burn   . Myocardial infarction   . Near syncope    uncertain cause. R/O arrhythmia, R/O med effect  . Osteoarthritis 06/14/2012  . Peptic ulcer, unspecified site, unspecified as acute or chronic, without mention of hemorrhage, perforation, or obstruction   . Personal history of colonic polyps   . Pneumonia   . Presbycusis of both ears  Bilateral hearing aids  . Presence of permanent cardiac pacemaker   . Prostatitis dx 12/2102   E coli Ucx  . SSS (sick sinus syndrome) (HCC)    syncope, s/p ppm 12/12  . Systolic murmur    Worrisome for AS. AS could cause exertional fatigue.    Past Surgical History:  Procedure Laterality Date  . CARDIAC CATHETERIZATION  2002  . cataracts     bilateral  . COLONOSCOPY    . CORONARY ARTERY BYPASS GRAFT  1997   (LIMA to LAD, SVG to diagonal-50% closed on catheterization in 1999, SVG to OM1, SVG to  PDA) Repeat cath 2009 with patent grafts  . EXCISION OF TONGUE LESION    . EYE SURGERY Bilateral    cataract surgery with lens implant  . HEMORRHOID SURGERY  07/31/2010  . INSERT / REPLACE / REMOVE PACEMAKER  08/27/2011   PPM implant  . IR GENERIC HISTORICAL  04/06/2016   IR ANGIO VERTEBRAL SEL SUBCLAVIAN INNOMINATE UNI L MOD SED 04/06/2016 Luanne Bras, MD [facility]  . IR GENERIC HISTORICAL  04/06/2016   IR ANGIO VERTEBRAL SEL VERTEBRAL UNI R MOD SED 04/06/2016 Luanne Bras, MD [facility]  . IR GENERIC HISTORICAL  04/06/2016   IR ANGIO INTRA EXTRACRAN SEL COM CAROTID INNOMINATE BILAT MOD SED 04/06/2016 Luanne Bras, MD [facility]  . IR GENERIC HISTORICAL  05/19/2016   IR ANGIO INTRA EXTRACRAN SEL INTERNAL CAROTID UNI L MOD SED 05/19/2016 Luanne Bras, MD [facility]  . IR GENERIC HISTORICAL  05/19/2016   IR NEURO EACH ADD'L AFTER BASIC UNI LEFT (MS) 05/19/2016 Luanne Bras, MD [facility]  . IR GENERIC HISTORICAL  05/19/2016   IR ANGIOGRAM FOLLOW UP STUDY 05/19/2016 Luanne Bras, MD [facility]  . IR GENERIC HISTORICAL  05/19/2016   IR ANGIO VERTEBRAL SEL VERTEBRAL UNI L MOD SED 05/19/2016 Luanne Bras, MD [facility]  . IR GENERIC HISTORICAL  05/19/2016   IR TRANSCATH/EMBOLIZ 05/19/2016 Luanne Bras, MD [facility]  . IR GENERIC HISTORICAL  05/19/2016   IR 3D INDEPENDENT WKST 05/19/2016 Luanne Bras, MD [facility]  . IR GENERIC HISTORICAL  06/03/2016   IR RADIOLOGIST EVAL & MGMT 06/03/2016 [facility]  . IR GENERIC HISTORICAL  07/20/2016   IR RADIOLOGIST EVAL & MGMT 07/20/2016 [facility]  . left rotator cuff surgery  2000  . PERMANENT PACEMAKER INSERTION N/A 08/27/2011   Procedure: PERMANENT PACEMAKER INSERTION;  Surgeon: Evans Lance, MD;  Location: [facility] CATH LAB;  Service: Cardiovascular;  Laterality: N/A;  . PUNCH BIOPSY OF SKIN  08/2009   5 mm punch biopsy on upper mid back melanotic appearing lesion  . RADIOLOGY WITH ANESTHESIA N/A  05/19/2016   Procedure: Embolization;  Surgeon: Luanne Bras, MD;  Location: [place];  Service: Radiology;  Laterality: N/A;    Current Medications: Outpatient Medications Prior to Visit  Medication Sig Dispense Refill  . acetaminophen (TYLENOL) 500 MG tablet Take 1 tablet (500 mg total) by mouth daily as needed for headache. 14 tablet 0  . apixaban (ELIQUIS) 2.5 MG TABS tablet Take 1 tablet (2.5 mg total) by mouth 2 (two) times daily. 60 tablet 11  . cephALEXin (KEFLEX) 500 MG capsule Take 1 capsule (500 mg total) by mouth 2 (two) times daily. 20 capsule 0  . diltiazem (CARDIZEM CD) 120 MG 24 hr capsule Take 120 mg by mouth daily.    . meclizine (ANTIVERT) 25 MG tablet Take 1 tablet (25 mg total) by mouth 3 (three) times daily as  needed for dizziness. 50 tablet 1  . nitroGLYCERIN (NITROSTAT) 0.4 MG SL tablet Place 0.4 mg under the tongue every 5 (five) minutes as needed for chest pain.    Marland Kitchen ondansetron (ZOFRAN ODT) 4 MG disintegrating tablet Take 1 tablet (4 mg total) by mouth every 8 (eight) hours as needed for nausea or vomiting. 20 tablet 0  . prednisoLONE acetate (PRED FORTE) 1 % ophthalmic suspension Place 1 drop into both eyes 4 (four) times daily as needed (for dry eyes).   0  . ranolazine (RANEXA) 500 MG 12 hr tablet Take 1 tablet (500 mg total) by mouth 2 (two) times daily. 180 tablet 3  . simvastatin (ZOCOR) 20 MG tablet Take 1 tablet (20 mg total) by mouth every evening. 90 tablet 2  . tamsulosin (FLOMAX) 0.4 MG CAPS capsule Take 1 capsule (0.4 mg total) by mouth daily. 90 capsule 2  . ticagrelor (BRILINTA) 90 MG TABS tablet Take 90 mg by mouth 2 (two) times daily.    . valsartan (DIOVAN) 320 MG tablet TAKE ONE-HALF (1/2) TABLET DAILY 45 tablet 1   No facility-administered medications prior to visit.      Allergies:   Codeine; Penicillin g; Penicillins; and Zyrtec [cetirizine hcl]   Social History   Social History  . Marital status: Married    Spouse name: N/A  . Number of  children: 4  . Years of education: N/A   Occupational History  . retired     retired Art gallery manager   Social History Main Topics  . Smoking status: Former Smoker    Packs/day: 0.50    Years: 15.00    Types: Cigarettes    Quit date: 09/13/1965  . Smokeless tobacco: Never Used  . Alcohol use No  . Drug use: No  . Sexual activity: Not Currently   Other Topics Concern  . None   Social History Narrative   Moved from [place] in [place] alone; lives in 2 story home but stays downstairs   3 children / 1 other;      Family History:  The patient's family history includes COPD in his brother and brother; Heart attack (age of onset: 37) in his mother; Heart disease in his brother, brother, mother, and sister; Hypertension in his brother; Other (age of onset: 35) in his father.   ROS:   Please see the history of present illness.    He is divorced is long-standing wife and is here today with his new wife. She is much younger. He complains of dizziness. No cardiac complaints. He refers to the [facility] neuro interventional team as "[facility]".  All other systems reviewed and are negative.   PHYSICAL EXAM:   VS:  BP 110/60 (BP Location: Right Arm, Patient Position: Sitting, Cuff Size: Normal)   Pulse 78   Ht 5\' 3"  (1.6 m)   Wt 132 lb (59.9 kg)   SpO2 98%   BMI 23.38 kg/m    GEN: Well nourished, well developed, in no acute distress  HEENT: normal  Neck: no JVD, carotid bruits, or masses Cardiac: RRR; no murmurs, rubs, or gallops,no edema  Respiratory:  clear to auscultation bilaterally, normal work of breathing GI: soft, nontender, nondistended, + BS MS: no deformity or atrophy  Skin: warm and dry, no rash Neuro:  Alert and Oriented x 3, Strength and sensation are intact Psych: euthymic mood, full affect  Wt Readings from Last 3 Encounters:  10/08/16 132 lb (59.9 kg)  09/14/16 131  lb (59.4 kg)  09/11/16 138 lb (62.6 kg)      Studies/Labs Reviewed:   EKG:  EKG  Not  repeated  Recent Labs: 02/17/2016: TSH 4.17 09/11/2016: ALT 14; BUN 8; Creatinine, Ser 0.95; Hemoglobin 14.6; Platelets 212; Potassium 3.8; Sodium 134   Lipid Panel    Component Value Date/Time   CHOL 120 08/19/2016 0404   TRIG 198 (H) 08/19/2016 0404   HDL 33 (L) 08/19/2016 0404   CHOLHDL 3.6 08/19/2016 0404   VLDL 40 08/19/2016 0404   LDLCALC 47 08/19/2016 0404    Additional studies/ records that were reviewed today include:  Summary from recent neurovascular intervention December 2017 CONCLUSIONS: 1. Severe, symptomatic in-stent stenosis within the left intradural distal internal carotid artery narrowed to approximately 0.5 mm in diameter with severe perfusion delay involving the left middle cerebral artery territory. The stent is narrowed to 1.5 mm in this location.  2. Following submaximal angioplasty, there is increase in vessel caliber to 1.5 mm with significant improvement in cerebral perfusion. The aneurysm is now seen opacifying with stagnation of contrast in the dome Kyung Rudd grade III occlusion).   ASSESSMENT:    1. Sick sinus syndrome ([place])   2. Coronary artery disease involving autologous vein coronary bypass graft with other forms of angina pectoris ([place])   3. Essential hypertension, benign   4. Paroxysmal atrial fibrillation (HCC)   5. Aneurysm, cerebral, nonruptured   6. AAA (abdominal aortic aneurysm) without rupture (HCC)   7. Cardiac pacemaker in situ   8. Hyperlipidemia with target LDL less than 70      PLAN:  In order of problems listed above:  1. Stable on current medical regimen and with pacemaker therapy. 2. Asymptomatic. Notify if increasing episodes of chest pain. 3. Excellent control. 2 g sodium diet. 4. Continue reduced dose Eliquis due to age, weight, and concomitant potent antiplatelet therapy. 5. This problem was treated with a stent by our neurovascular team in September 2017 and he represented with a left brain acute ischemic syndrome noted  to be related to in-stent restenosis of the device. This was treated with redilatation by the team at [facility]. 6. Not addressed 7. Not addressed    Medication Adjustments/Labs and Tests Ordered: Current medicines are reviewed at length with the patient today.  Concerns regarding medicines are outlined above.  Medication changes, Labs and Tests ordered today are listed in the Patient Instructions below. Patient Instructions  Medication Instructions:  None  Labwork: None  Testing/Procedures: None  Follow-Up: Your physician wants you to follow-up in: 6 months with Dr. Tamala Julian.  You will receive a reminder letter in the mail two months in advance. If you don't receive a letter, please call our office to schedule the follow-up appointment.   Any Other Special Instructions Will Be Listed Below (If Applicable).     If you need a refill on your cardiac medications before your next appointment, please call your pharmacy.      Signed, Sinclair Grooms, MD  10/08/2016 10:41 AM    [facility] [place], [place], [place]  [address] Phone: [phone]; Fax: [phone]

## 2016-10-08 NOTE — Patient Instructions (Signed)
Medication Instructions:  None  Labwork: None  Testing/Procedures: None  Follow-Up: Your physician wants you to follow-up in: 6 months with Dr. Smith.  You will receive a reminder letter in the mail two months in advance. If you don't receive a letter, please call our office to schedule the follow-up appointment.   Any Other Special Instructions Will Be Listed Below (If Applicable).     If you need a refill on your cardiac medications before your next appointment, please call your pharmacy.   

## 2016-10-12 ENCOUNTER — Other Ambulatory Visit: Payer: Self-pay | Admitting: Internal Medicine

## 2016-10-12 DIAGNOSIS — I495 Sick sinus syndrome: Secondary | ICD-10-CM

## 2016-10-26 ENCOUNTER — Encounter: Payer: Self-pay | Admitting: Internal Medicine

## 2016-10-26 ENCOUNTER — Ambulatory Visit (INDEPENDENT_AMBULATORY_CARE_PROVIDER_SITE_OTHER): Payer: Medicare Other | Admitting: Internal Medicine

## 2016-10-26 VITALS — BP 122/84 | HR 78 | Temp 97.8°F | Resp 16 | Wt 132.0 lb

## 2016-10-26 DIAGNOSIS — R202 Paresthesia of skin: Secondary | ICD-10-CM

## 2016-10-26 DIAGNOSIS — R110 Nausea: Secondary | ICD-10-CM | POA: Insufficient documentation

## 2016-10-26 DIAGNOSIS — M5412 Radiculopathy, cervical region: Secondary | ICD-10-CM | POA: Diagnosis not present

## 2016-10-26 DIAGNOSIS — R200 Anesthesia of skin: Secondary | ICD-10-CM | POA: Diagnosis not present

## 2016-10-26 NOTE — Progress Notes (Signed)
Pre visit review using our clinic review tool, if applicable. No additional management support is needed unless otherwise documented below in the visit note. 

## 2016-10-26 NOTE — Assessment & Plan Note (Signed)
Review EMG results He does not want to follow up with orthopedics-I am unclear what the follow-up instructions were ordered. Orthopedics try to reach them. They are not always able to follow He does currently follow with neurology and neurosurgery at [facility] and he will discuss this further with them

## 2016-10-26 NOTE — Assessment & Plan Note (Signed)
Nausea for the past 3 days-mild in nature No vomiting, other GI symptoms or cold symptoms Is not taking antinausea medication He is eating normally No concerning symptoms so we will just monitor for now He deferred additional treatment

## 2016-10-26 NOTE — Progress Notes (Signed)
Subjective:    Patient ID: Shaun James, male    DOB: [birth year], 83 y.o.   MRN: [id number]  HPI The patient is here for follow up of recent test results  He continues to have right arm numbness/tingling in his fourth and fifth fingers that radiates up to his forearm. This occurred after he receives an IV in the hospital in his right forearm. He did have a large hematoma and swelling of the arm after the IV site was infiltrated or was unsuccessful in obtaining. He still has some discoloration in the forearm. He is concerned that he continues to have the numbness/tingling and it seems to be getting worse-expanding up his arm. He did see Dr. Percell Miller, orthopedics and he had him go for a nerve conduction test with Dr. Posey Pronto. The EMG showed chronic C8 radiculopathy in the right arm which was very mild. No other abnormalities were seen. Patient states that he was not told the results is unsure what he can do from here.   Nausea;  He has had nausea for the past three days.  He denies GERD. He does have some mild intermittent dizziness, but this is not new. He denies other cold symptoms, stomach pain, changes in his bowel habits, blood in his stool or black stool..    She is doing speech therapy in [place].   Medications and allergies reviewed with patient and updated if appropriate.  Patient Active Problem List   Diagnosis Date Noted  . Cervical radiculopathy at C8 10/08/2016  . UTI (urinary tract infection) 09/14/2016  . Paroxysmal atrial fibrillation (HCC)   . Right hand weakness 08/18/2016  . Expressive aphasia 08/18/2016  . Essential hypertension, benign 08/17/2016  . Numbness and tingling of right arm 08/17/2016  . Phlebitis 05/28/2016  . AAA (abdominal aortic aneurysm) without rupture ([place]) 04/25/2016  . Pulmonary nodule 04/25/2016  . Bilateral hearing loss 03/22/2016  . Aneurysm, cerebral, nonruptured 03/11/2016  . Carotid artery stenosis 03/11/2016  . Vertigo 03/10/2016  . BPPV  (benign paroxysmal positional vertigo) 03/10/2016  . Ataxia   . Rhinorrhea 08/12/2015  . Burning tongue 08/12/2015  . GERD (gastroesophageal reflux disease) 08/12/2015  . BPH without urinary obstruction 04/10/2015  . 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 Insomnia 05/22/2014  . Chronic headache   . Osteoarthritis   . Cardiac pacemaker in situ 10/18/2011  . CAD (coronary artery disease), autologous vein bypass graft 09/13/2011  . Sick sinus syndrome ([place]) 09/13/2011  . Hyperlipidemia with target LDL less than 70 04/16/2008  . Acute myocardial infarction 04/16/2008  . Pulmonary fibrosis ([place]) 04/16/2008    Current Outpatient Prescriptions on File Prior to Visit  Medication Sig Dispense Refill  . acetaminophen (TYLENOL) 500 MG tablet Take 1 tablet (500 mg total) by mouth daily as needed for headache. 14 tablet 0  . apixaban (ELIQUIS) 2.5 MG TABS tablet Take 1 tablet (2.5 mg total) by mouth 2 (two) times daily. 60 tablet 11  . CARTIA XT 120 MG 24 hr capsule TAKE 1 CAPSULE DAILY 90 capsule 3  . diltiazem (CARDIZEM CD) 120 MG 24 hr capsule Take 120 mg by mouth daily.    . meclizine (ANTIVERT) 25 MG tablet Take 1 tablet (25 mg total) by mouth 3 (three) times daily as needed for dizziness. 50 tablet 1  . nitroGLYCERIN (NITROSTAT) 0.4 MG SL tablet Place 0.4 mg under the tongue every 5 (five) minutes as needed for chest pain.    Marland Kitchen ondansetron (ZOFRAN ODT) 4 MG  disintegrating tablet Take 1 tablet (4 mg total) by mouth every 8 (eight) hours as needed for nausea or vomiting. 20 tablet 0  . prednisoLONE acetate (PRED FORTE) 1 % ophthalmic suspension Place 1 drop into both eyes 4 (four) times daily as needed (for dry eyes).   0  . ranolazine (RANEXA) 500 MG 12 hr tablet Take 1 tablet (500 mg total) by mouth 2 (two) times daily. 180 tablet 3  . simvastatin (ZOCOR) 20 MG tablet Take 1 tablet (20 mg total) by mouth every evening. 90 tablet 2  . tamsulosin (FLOMAX) 0.4 MG CAPS capsule Take 1  capsule (0.4 mg total) by mouth daily. 90 capsule 2  . ticagrelor (BRILINTA) 90 MG TABS tablet Take 90 mg by mouth 2 (two) times daily.    . valsartan (DIOVAN) 320 MG tablet TAKE ONE-HALF (1/2) TABLET DAILY 45 tablet 1   No current facility-administered medications on file prior to visit.     Past Medical History:  Diagnosis Date  . AAA (abdominal aortic aneurysm) (HCC)    3.1 cm AAA, reevaluated 08/2009 per ultrasound - stable  . Allergic rhinitis   . Aortic sclerosis    Probable AS on physical exam, 2010  . Aortic stenosis    mild AS 2014 echo  . Arthritis   . Atrial fibrillation (HCC)    chronic anticoag  . Atrial flutter ([place])   . Basal cell carcinoma   . Benign positional vertigo   . BPH (benign prostatic hyperplasia)   . CAD in native artery    a) s/p CABG '98; LIMA-LAD, SVG-D1, SVG-OM1, SVG-PDA); b) CATH -2009: midLAD & RCA 100%, OM2 100% wtih severe native Cx; Grafts patent with ~30-50% SVG-D1 & ~30-40% SVG-OM, EF 45%; c) Lexiscan Cardiolite 02/2014: EF 61%, No Ischemia; subtle fixed anteroseptal defect.  . Cardiac pacemaker in [place]  . Coronary atherosclerosis of native coronary artery   . Diverticulosis of colon (without mention of hemorrhage)   . DJD (degenerative joint disease)   . Esophageal reflux   . Essential hypertension, benign   . GERD (gastroesophageal reflux disease)   . Headache    after brain aneurysm  . History of tuberculosis    remote Hx  . Inflamed seborrheic keratosis   . Intermittent confusion   . Internal hemorrhoids    severe  . Mixed hyperlipidemia   . Mouth burn   . Myocardial infarction   . Near syncope    uncertain cause. R/O arrhythmia, R/O med effect  . Osteoarthritis 06/14/2012  . Peptic ulcer, unspecified site, unspecified as acute or chronic, without mention of hemorrhage, perforation, or obstruction   . Personal history of colonic polyps   . Pneumonia   . Presbycusis of both ears    Bilateral hearing aids    . Presence of permanent cardiac pacemaker   . Prostatitis dx 12/2102   E coli Ucx  . SSS (sick sinus syndrome) (HCC)    syncope, s/p ppm 12/12  . Systolic murmur    Worrisome for AS. AS could cause exertional fatigue.    Past Surgical History:  Procedure Laterality Date  . CARDIAC CATHETERIZATION  2002  . cataracts     bilateral  . COLONOSCOPY    . CORONARY ARTERY BYPASS GRAFT  1997   (LIMA to LAD, SVG to diagonal-50% closed on catheterization in 1999, SVG to OM1, SVG to PDA) Repeat cath 2009 with patent grafts  . EXCISION OF TONGUE LESION    .  EYE SURGERY Bilateral    cataract surgery with lens implant  . HEMORRHOID SURGERY  07/31/2010  . INSERT / REPLACE / REMOVE PACEMAKER  08/27/2011   PPM implant  . IR GENERIC HISTORICAL  04/06/2016   IR ANGIO VERTEBRAL SEL SUBCLAVIAN INNOMINATE UNI L MOD SED 04/06/2016 Luanne Bras, MD [facility]  . IR GENERIC HISTORICAL  04/06/2016   IR ANGIO VERTEBRAL SEL VERTEBRAL UNI R MOD SED 04/06/2016 Luanne Bras, MD [facility]  . IR GENERIC HISTORICAL  04/06/2016   IR ANGIO INTRA EXTRACRAN SEL COM CAROTID INNOMINATE BILAT MOD SED 04/06/2016 Luanne Bras, MD [facility]  . IR GENERIC HISTORICAL  05/19/2016   IR ANGIO INTRA EXTRACRAN SEL INTERNAL CAROTID UNI L MOD SED 05/19/2016 Luanne Bras, MD [facility]  . IR GENERIC HISTORICAL  05/19/2016   IR NEURO EACH ADD'L AFTER BASIC UNI LEFT (MS) 05/19/2016 Luanne Bras, MD [facility]  . IR GENERIC HISTORICAL  05/19/2016   IR ANGIOGRAM FOLLOW UP STUDY 05/19/2016 Luanne Bras, MD [facility]  . IR GENERIC HISTORICAL  05/19/2016   IR ANGIO VERTEBRAL SEL VERTEBRAL UNI L MOD SED 05/19/2016 Luanne Bras, MD [facility]  . IR GENERIC HISTORICAL  05/19/2016   IR TRANSCATH/EMBOLIZ 05/19/2016 Luanne Bras, MD [facility]  . IR GENERIC HISTORICAL  05/19/2016   IR 3D INDEPENDENT WKST 05/19/2016 Luanne Bras, MD [facility]  . IR GENERIC HISTORICAL  06/03/2016   IR RADIOLOGIST  EVAL & MGMT 06/03/2016 [facility]  . IR GENERIC HISTORICAL  07/20/2016   IR RADIOLOGIST EVAL & MGMT 07/20/2016 [facility]  . left rotator cuff surgery  2000  . PERMANENT PACEMAKER INSERTION N/A 08/27/2011   Procedure: PERMANENT PACEMAKER INSERTION;  Surgeon: Evans Lance, MD;  Location: [facility] CATH LAB;  Service: Cardiovascular;  Laterality: N/A;  . PUNCH BIOPSY OF SKIN  08/2009   5 mm punch biopsy on upper mid back melanotic appearing lesion  . RADIOLOGY WITH ANESTHESIA N/A 05/19/2016   Procedure: Embolization;  Surgeon: Luanne Bras, MD;  Location: [place];  Service: Radiology;  Laterality: N/A;    Social History   Social History  . Marital status: Married    Spouse name: N/A  . Number of children: 4  . Years of education: N/A   Occupational History  . retired     retired Art gallery manager   Social History Main Topics  . Smoking status: Former Smoker    Packs/day: 0.50    Years: 15.00    Types: Cigarettes    Quit date: 09/13/1965  . Smokeless tobacco: Never Used  . Alcohol use No  . Drug use: No  . Sexual activity: Not Currently   Other Topics Concern  . Not on file   Social History Narrative   Moved from [place] in [place] alone; lives in 2 story home but stays downstairs   3 children / 1 other;     Family History  Problem Relation Age of Onset  . Other Father 26    [place]  . Heart attack Mother 64  . Heart disease Mother     ASHD  . COPD Brother   . Hypertension Brother   . Heart disease Brother     ASHD  . Heart disease Brother     ASHD  . Heart disease Sister     ASHD  . COPD Brother   . Coronary artery disease      Review of Systems  Constitutional: Negative for fever.  HENT: Negative for congestion and sore throat.   Respiratory: Negative for cough, shortness of breath and wheezing.   Gastrointestinal: Positive for nausea. Negative for abdominal pain, blood in stool (no black stool), constipation and diarrhea.  Neurological:  Positive for dizziness and numbness. Negative for headaches.       Objective:   Vitals:   10/26/16 1052  BP: 122/84  Pulse: 78  Resp: 16  Temp: 97.8 F (36.6 C)   Wt Readings from Last 3 Encounters:  10/26/16 132 lb (59.9 kg)  10/08/16 132 lb (59.9 kg)  09/14/16 131 lb (59.4 kg)   Body mass index is 23.38 kg/m.   Physical Exam  Constitutional: He appears well-developed and well-nourished. No distress.  Neurological:  Decreased sensation right 4-5 fingers and distal forearm  Skin: He is not diaphoretic.  Psychiatric: He has a normal mood and affect. His behavior is normal.           Assessment & Plan:    See Problem List for Assessment and Plan of chronic medical problems.

## 2016-10-26 NOTE — Assessment & Plan Note (Signed)
Saw orthopedics and he did not find them helpful He did have an EMG- chronic C8 radiculopathy affecting the right arm, which is very mild in severity by EMG He does not want to return to orthopedics He does see neurology and neurosurgery at [facility] and we'll discuss the numbness and tingling of the right arm with them

## 2016-10-26 NOTE — Patient Instructions (Signed)
The nerve study of your right arm showed chronic C8 radiculopathy affecting the right arm, which is very mild in severity.    Follow up with your neurologist and neurosurgeron.

## 2016-10-29 ENCOUNTER — Ambulatory Visit: Payer: Medicare Other | Admitting: Neurology

## 2016-11-05 ENCOUNTER — Encounter: Payer: Self-pay | Admitting: Internal Medicine

## 2016-12-12 NOTE — Patient Instructions (Addendum)
Monitor your blood pressure at home - goal is 130-140 on top.   Medications reviewed and updated.  No changes recommended at this time.  Your prescription(s) have been submitted to your pharmacy. Please take as directed and contact our office if you believe you are having problem(s) with the medication(s).   Please followup in 6 months

## 2016-12-12 NOTE — Progress Notes (Addendum)
Subjective:    Patient ID: Shaun James, male    DOB: 25-Jul-1931, 83 y.o.   MRN: [id number]  HPI The patient is here for follow up.  CAD s/p CABG, PAfib, Hypertension: He is taking his medication daily. He is compliant with a low sodium diet.  He denies chest pain, palpitations, edema, shortness of breath and regular headaches. He is exercising regularly - walking 20 minutes every other day.  He does not monitor his blood pressure at home.  His goal SBP per neurology is 130-140 to allow better cerebral perfusion.   Hyperlipidemia: He is taking his medication daily. He is compliant with a low fat/cholesterol diet - he eats a lot of fish and avoids red meat. He is exercising regularly.     CVA, Cerebral aneurysm s/p stent with instent stenosis s/p angioplasty.  He will start speech therapy soon.  He is following with neurology and neurosurgery at [facility].  His balance has not been good since his stroke and surgery.  He is trying to be active.  His daughter wonders about a walker.    Has elbow pain, numbness in right fingers.  He can tolerate the symptoms.  He has discussed this with neurology and has seen orthopedics.   GERD:  He has occasional gerd symptoms.  He takes medication as needed.  Medications and allergies reviewed with patient and updated if appropriate.  Patient Active Problem List   Diagnosis Date Noted  . Nausea 10/26/2016  . Cervical radiculopathy at C8 10/08/2016  . Paroxysmal atrial fibrillation (HCC)   . Right hand weakness 08/18/2016  . Expressive aphasia 08/18/2016  . Essential hypertension, benign 08/17/2016  . Numbness and tingling of right arm 08/17/2016  . AAA (abdominal aortic aneurysm) without rupture ([place]) 04/25/2016  . Pulmonary nodule 04/25/2016  . Bilateral hearing loss 03/22/2016  . Aneurysm, cerebral, nonruptured 03/11/2016  . Carotid artery stenosis 03/11/2016  . Vertigo 03/10/2016  . BPPV (benign paroxysmal positional vertigo) 03/10/2016  . Ataxia     . Rhinorrhea 08/12/2015  . Burning tongue 08/12/2015  . GERD (gastroesophageal reflux disease) 08/12/2015  . BPH without urinary obstruction 04/10/2015  . 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 Insomnia 05/22/2014  . Chronic headache   . Osteoarthritis   . Cardiac pacemaker in situ 10/18/2011  . CAD (coronary artery disease), autologous vein bypass graft 09/13/2011  . Sick sinus syndrome ([place]) 09/13/2011  . Hyperlipidemia with target LDL less than 70 04/16/2008  . Acute myocardial infarction 04/16/2008  . Pulmonary fibrosis ([place]) 04/16/2008    Current Outpatient Prescriptions on File Prior to Visit  Medication Sig Dispense Refill  . acetaminophen (TYLENOL) 500 MG tablet Take 1 tablet (500 mg total) by mouth daily as needed for headache. 14 tablet 0  . apixaban (ELIQUIS) 2.5 MG TABS tablet Take 1 tablet (2.5 mg total) by mouth 2 (two) times daily. 60 tablet 11  . CARTIA XT 120 MG 24 hr capsule TAKE 1 CAPSULE DAILY 90 capsule 3  . diltiazem (CARDIZEM CD) 120 MG 24 hr capsule Take 120 mg by mouth daily.    . meclizine (ANTIVERT) 25 MG tablet Take 1 tablet (25 mg total) by mouth 3 (three) times daily as needed for dizziness. 50 tablet 1  . nitroGLYCERIN (NITROSTAT) 0.4 MG SL tablet Place 0.4 mg under the tongue every 5 (five) minutes as needed for chest pain.    Marland Kitchen ondansetron (ZOFRAN ODT) 4 MG disintegrating tablet Take 1 tablet (4 mg total) by mouth  every 8 (eight) hours as needed for nausea or vomiting. 20 tablet 0  . prednisoLONE acetate (PRED FORTE) 1 % ophthalmic suspension Place 1 drop into both eyes 4 (four) times daily as needed (for dry eyes).   0  . ranolazine (RANEXA) 500 MG 12 hr tablet Take 1 tablet (500 mg total) by mouth 2 (two) times daily. 180 tablet 3  . tamsulosin (FLOMAX) 0.4 MG CAPS capsule Take 1 capsule (0.4 mg total) by mouth daily. 90 capsule 2  . valsartan (DIOVAN) 320 MG tablet TAKE ONE-HALF (1/2) TABLET BY MOUTH DAILY     No current facility-administered  medications on file prior to visit.     Past Medical History:  Diagnosis Date  . AAA (abdominal aortic aneurysm) (HCC)    3.1 cm AAA, reevaluated 08/2009 per ultrasound - stable  . Allergic rhinitis   . Aortic sclerosis    Probable AS on physical exam, 2010  . Aortic stenosis    mild AS 2014 echo  . Arthritis   . Atrial fibrillation (HCC)    chronic anticoag  . Atrial flutter ([place])   . Basal cell carcinoma   . Benign positional vertigo   . BPH (benign prostatic hyperplasia)   . CAD in native artery    a) s/p CABG '98; LIMA-LAD, SVG-D1, SVG-OM1, SVG-PDA); b) CATH -2009: midLAD & RCA 100%, OM2 100% wtih severe native Cx; Grafts patent with ~30-50% SVG-D1 & ~30-40% SVG-OM, EF 45%; c) Lexiscan Cardiolite 02/2014: EF 61%, No Ischemia; subtle fixed anteroseptal defect.  . Cardiac pacemaker in [place]  . Coronary atherosclerosis of native coronary artery   . Diverticulosis of colon (without mention of hemorrhage)   . DJD (degenerative joint disease)   . Esophageal reflux   . Essential hypertension, benign   . GERD (gastroesophageal reflux disease)   . Headache    after brain aneurysm  . History of tuberculosis    remote Hx  . Inflamed seborrheic keratosis   . Intermittent confusion   . Internal hemorrhoids    severe  . Mixed hyperlipidemia   . Mouth burn   . Myocardial infarction   . Near syncope    uncertain cause. R/O arrhythmia, R/O med effect  . Osteoarthritis 06/14/2012  . Peptic ulcer, unspecified site, unspecified as acute or chronic, without mention of hemorrhage, perforation, or obstruction   . Personal history of colonic polyps   . Pneumonia   . Presbycusis of both ears    Bilateral hearing aids  . Presence of permanent cardiac pacemaker   . Prostatitis dx 12/2102   E coli Ucx  . SSS (sick sinus syndrome) (HCC)    syncope, s/p ppm 12/12  . Systolic murmur    Worrisome for AS. AS could cause exertional fatigue.    Past Surgical History:    Procedure Laterality Date  . CARDIAC CATHETERIZATION  2002  . cataracts     bilateral  . COLONOSCOPY    . CORONARY ARTERY BYPASS GRAFT  1997   (LIMA to LAD, SVG to diagonal-50% closed on catheterization in 1999, SVG to OM1, SVG to PDA) Repeat cath 2009 with patent grafts  . EXCISION OF TONGUE LESION    . EYE SURGERY Bilateral    cataract surgery with lens implant  . HEMORRHOID SURGERY  07/31/2010  . INSERT / REPLACE / REMOVE PACEMAKER  08/27/2011   PPM implant  . IR GENERIC HISTORICAL  04/06/2016   IR ANGIO VERTEBRAL SEL SUBCLAVIAN INNOMINATE UNI L  MOD SED 04/06/2016 Luanne Bras, MD [facility]  . IR GENERIC HISTORICAL  04/06/2016   IR ANGIO VERTEBRAL SEL VERTEBRAL UNI R MOD SED 04/06/2016 Luanne Bras, MD [facility]  . IR GENERIC HISTORICAL  04/06/2016   IR ANGIO INTRA EXTRACRAN SEL COM CAROTID INNOMINATE BILAT MOD SED 04/06/2016 Luanne Bras, MD [facility]  . IR GENERIC HISTORICAL  05/19/2016   IR ANGIO INTRA EXTRACRAN SEL INTERNAL CAROTID UNI L MOD SED 05/19/2016 Luanne Bras, MD [facility]  . IR GENERIC HISTORICAL  05/19/2016   IR NEURO EACH ADD'L AFTER BASIC UNI LEFT (MS) 05/19/2016 Luanne Bras, MD [facility]  . IR GENERIC HISTORICAL  05/19/2016   IR ANGIOGRAM FOLLOW UP STUDY 05/19/2016 Luanne Bras, MD [facility]  . IR GENERIC HISTORICAL  05/19/2016   IR ANGIO VERTEBRAL SEL VERTEBRAL UNI L MOD SED 05/19/2016 Luanne Bras, MD [facility]  . IR GENERIC HISTORICAL  05/19/2016   IR TRANSCATH/EMBOLIZ 05/19/2016 Luanne Bras, MD [facility]  . IR GENERIC HISTORICAL  05/19/2016   IR 3D INDEPENDENT WKST 05/19/2016 Luanne Bras, MD [facility]  . IR GENERIC HISTORICAL  06/03/2016   IR RADIOLOGIST EVAL & MGMT 06/03/2016 [facility]  . IR GENERIC HISTORICAL  07/20/2016   IR RADIOLOGIST EVAL & MGMT 07/20/2016 [facility]  . left rotator cuff surgery  2000  . PERMANENT PACEMAKER INSERTION N/A 08/27/2011   Procedure: PERMANENT PACEMAKER  INSERTION;  Surgeon: Evans Lance, MD;  Location: [facility] CATH LAB;  Service: Cardiovascular;  Laterality: N/A;  . PUNCH BIOPSY OF SKIN  08/2009   5 mm punch biopsy on upper mid back melanotic appearing lesion  . RADIOLOGY WITH ANESTHESIA N/A 05/19/2016   Procedure: Embolization;  Surgeon: Luanne Bras, MD;  Location: [place];  Service: Radiology;  Laterality: N/A;    Social History   Social History  . Marital status: Married    Spouse name: N/A  . Number of children: 4  . Years of education: N/A   Occupational History  . retired     retired Art gallery manager   Social History Main Topics  . Smoking status: Former Smoker    Packs/day: 0.50    Years: 15.00    Types: Cigarettes    Quit date: 09/13/1965  . Smokeless tobacco: Never Used  . Alcohol use No  . Drug use: No  . Sexual activity: Not Currently   Other Topics Concern  . Not on file   Social History Narrative   Moved from [place] in [place] alone; lives in 2 story home but stays downstairs   3 children / 1 other;     Family History  Problem Relation Age of Onset  . Other Father 54    [place]  . Heart attack Mother 57  . Heart disease Mother     ASHD  . COPD Brother   . Hypertension Brother   . Heart disease Brother     ASHD  . Heart disease Brother     ASHD  . Heart disease Sister     ASHD  . COPD Brother   . Coronary artery disease      Review of Systems  Constitutional: Negative for appetite change and fever.  Respiratory: Negative for cough, shortness of breath and wheezing.   Cardiovascular: Positive for palpitations. Negative for chest pain and leg swelling.  Gastrointestinal:       Occ gerd  Musculoskeletal: Positive for gait problem.  Neurological: Positive for dizziness. Negative for  headaches.       Objective:   Vitals:   12/13/16 1104  BP: 110/78  Pulse: 75  Resp: 16  Temp: 97.5 F (36.4 C)   Wt Readings from Last 3 Encounters:  12/13/16 130 lb (59 kg)  10/26/16 132 lb  (59.9 kg)  10/08/16 132 lb (59.9 kg)   Body mass index is 23.03 kg/m.   Physical Exam    Constitutional: Appears well-developed and well-nourished. No distress.  HENT:  Head: Normocephalic and atraumatic.  Neck: Neck supple. No tracheal deviation present. No thyromegaly present.  No cervical lymphadenopathy Cardiovascular: Normal rate, regular rhythm and normal heart sounds.   2/6 systolic murmur heard. No carotid bruit .  No edema Pulmonary/Chest: Effort normal and breath sounds normal. No respiratory distress. No has no wheezes. No rales.  Skin: Skin is warm and dry. Not diaphoretic.  Psychiatric: Normal mood and affect. Behavior is normal.      Assessment & Plan:    See Problem List for Assessment and Plan of chronic medical problems.

## 2016-12-13 ENCOUNTER — Ambulatory Visit (INDEPENDENT_AMBULATORY_CARE_PROVIDER_SITE_OTHER): Payer: Medicare Other | Admitting: Internal Medicine

## 2016-12-13 ENCOUNTER — Encounter: Payer: Self-pay | Admitting: Internal Medicine

## 2016-12-13 VITALS — BP 110/78 | HR 75 | Temp 97.5°F | Resp 16 | Ht 63.0 in | Wt 130.0 lb

## 2016-12-13 DIAGNOSIS — K219 Gastro-esophageal reflux disease without esophagitis: Secondary | ICD-10-CM

## 2016-12-13 DIAGNOSIS — I10 Essential (primary) hypertension: Secondary | ICD-10-CM

## 2016-12-13 DIAGNOSIS — I671 Cerebral aneurysm, nonruptured: Secondary | ICD-10-CM

## 2016-12-13 DIAGNOSIS — E785 Hyperlipidemia, unspecified: Secondary | ICD-10-CM

## 2016-12-13 DIAGNOSIS — R270 Ataxia, unspecified: Secondary | ICD-10-CM

## 2016-12-13 MED ORDER — VALSARTAN 160 MG PO TABS: 160.0000 mg | ORAL_TABLET | Freq: Every day | ORAL | 1 refills | Status: DC

## 2016-12-13 NOTE — Assessment & Plan Note (Signed)
Stent restenosis after brilinta stopped Had two procedures to reopen stent  Following with Dr Rogers Blocker at [place] [facility] [place]

## 2016-12-13 NOTE — Progress Notes (Signed)
Pre visit review using our clinic review tool, if applicable. No additional management support is needed unless otherwise documented below in the visit note. 

## 2016-12-13 NOTE — Assessment & Plan Note (Signed)
Has occasional symptoms Takes medication as needed Continue prn medication

## 2016-12-13 NOTE — Assessment & Plan Note (Addendum)
BP good - goal is 130-140 per neurology Change diovan 320 mg - take 1/2 pill to 160 mg pill for convenience Monitor BP at home - goal 130-140 SBP - may need to decrease diovan further if needed

## 2016-12-13 NOTE — Assessment & Plan Note (Signed)
Lipids controlled Continue lipitor

## 2016-12-14 ENCOUNTER — Encounter: Payer: Self-pay | Admitting: Internal Medicine

## 2016-12-14 ENCOUNTER — Ambulatory Visit (INDEPENDENT_AMBULATORY_CARE_PROVIDER_SITE_OTHER): Payer: Medicare Other | Admitting: Internal Medicine

## 2016-12-14 VITALS — BP 122/82 | HR 59 | Ht 63.0 in | Wt 131.4 lb

## 2016-12-14 DIAGNOSIS — Z950 Presence of cardiac pacemaker: Secondary | ICD-10-CM

## 2016-12-14 DIAGNOSIS — I495 Sick sinus syndrome: Secondary | ICD-10-CM | POA: Diagnosis not present

## 2016-12-14 LAB — CUP PACEART INCLINIC DEVICE CHECK
Date Time Interrogation Session: 20180403040000
Implantable Lead Implant Date: 20121214
Implantable Lead Location: 753860
Implantable Lead Model: 4135
Implantable Lead Model: 4136
Implantable Lead Serial Number: 29066260
Lead Channel Impedance Value: 809 Ohm
Lead Channel Pacing Threshold Amplitude: 0.5 V
Lead Channel Pacing Threshold Pulse Width: 0.4 ms
Lead Channel Sensing Intrinsic Amplitude: 24.4 mV
Lead Channel Setting Pacing Amplitude: 2 V
Lead Channel Setting Pacing Pulse Width: 0.4 ms
Lead Channel Setting Sensing Sensitivity: 2.5 mV
MDC IDC LEAD IMPLANT DT: 20121214
MDC IDC LEAD LOCATION: 753859
MDC IDC LEAD SERIAL: 29098858
MDC IDC MSMT LEADCHNL RA IMPEDANCE VALUE: 562 Ohm
MDC IDC MSMT LEADCHNL RA PACING THRESHOLD AMPLITUDE: 1 V
MDC IDC MSMT LEADCHNL RA PACING THRESHOLD PULSEWIDTH: 0.4 ms
MDC IDC MSMT LEADCHNL RA SENSING INTR AMPL: 3.9 mV
MDC IDC PG IMPLANT DT: 20121214
MDC IDC PG SERIAL: 118262
MDC IDC SET LEADCHNL RV PACING AMPLITUDE: 1.1 V

## 2016-12-14 NOTE — Patient Instructions (Signed)
Medication Instructions:  Your physician recommends that you continue on your current medications as directed. Please refer to the Current Medication list given to you today.  Labwork: None ordered  Testing/Procedures: None ordered  Follow-Up: Remote monitoring is used to monitor your Pacemaker of ICD from home. This monitoring reduces the number of office visits required to check your device to one time per year. It allows [place] to keep an eye on the functioning of your device to ensure it is working properly. You are scheduled for a device check from home on 03/15/2017. You may send your transmission at any time that day. If you have a wireless device, the transmission will be sent automatically. After your physician reviews your transmission, you will receive a postcard with your next transmission date.  Your physician wants you to follow-up in: 1 year with Dr. Lovena Le. You will receive a reminder letter in the mail two months in advance. If you don't receive a letter, please call our office to schedule the follow-up appointment.   If you need a refill on your cardiac medications before your next appointment, please call your pharmacy.

## 2016-12-14 NOTE — Progress Notes (Signed)
HPI [name] returns today for ongoing evaluation and management of his DDD PM. He is a very Copy with a h/o CAD, PAF, and symptomatic bradycardia who underwent PPM insertion 5 years ago. In the interim, he has been stable. He is quite hard of hearing.  He denies chest pain or sob. No peripheral edema. He has had a stroke in the past and some residual expressive aphasia (mild). Allergies  Allergen Reactions  . Codeine Rash and Nausea And Vomiting  . Penicillin G Rash  . Penicillins Rash    Has patient had a PCN reaction causing immediate rash, facial/tongue/throat swelling, SOB or lightheadedness with hypotension: Yes Has patient had a PCN reaction causing severe rash involving mucus membranes or skin necrosis: No Has patient had a PCN reaction that required hospitalization No Has patient had a PCN reaction occurring within the last 10 years: No If all of the above answers are "NO", then may proceed with Cephalosporin use.   Shaun James [Cetirizine Hcl] Rash          Current Outpatient Prescriptions  Medication Sig Dispense Refill  . acetaminophen (TYLENOL) 500 MG tablet Take 1 tablet (500 mg total) by mouth daily as needed for headache. 14 tablet 0  . apixaban (ELIQUIS) 2.5 MG TABS tablet Take 1 tablet (2.5 mg total) by mouth 2 (two) times daily. 60 tablet 11  . atorvastatin (LIPITOR) 10 MG tablet Take 10 mg by mouth daily at 6 PM.    . CARTIA XT 120 MG 24 hr capsule TAKE 1 CAPSULE DAILY 90 capsule 3  . nitroGLYCERIN (NITROSTAT) 0.4 MG SL tablet Place 0.4 mg under the tongue every 5 (five) minutes as needed for chest pain.    . ranitidine (ZANTAC) 150 MG tablet TAKE 1 TABLET (150 MG TOTAL) BY MOUTH 2 TIMES DAILY.  2  . ranolazine (RANEXA) 500 MG 12 hr tablet Take 1 tablet (500 mg total) by mouth 2 (two) times daily. 180 tablet 3  . tamsulosin (FLOMAX) 0.4 MG CAPS capsule Take 1 capsule (0.4 mg total) by mouth daily. 90 capsule 2  . ticagrelor (BRILINTA)  90 MG TABS tablet Take 90 mg by mouth 2 (two) times daily.      No current facility-administered medications for this visit.      Past Medical History:  Diagnosis Date  . AAA (abdominal aortic aneurysm) (HCC)    3.1 cm AAA, reevaluated 08/2009 per ultrasound - stable  . Allergic rhinitis   . Aortic sclerosis    Probable AS on physical exam, 2010  . Aortic stenosis    mild AS 2014 echo  . Arthritis   . Atrial fibrillation (HCC)    chronic anticoag  . Atrial flutter ([place])   . Basal cell carcinoma   . Benign positional vertigo   . BPH (benign prostatic hyperplasia)   . CAD in native artery    a) s/p CABG '98; LIMA-LAD, SVG-D1, SVG-OM1, SVG-PDA); b) CATH -2009: midLAD & RCA 100%, OM2 100% wtih severe native Cx; Grafts patent with ~30-50% SVG-D1 & ~30-40% SVG-OM, EF 45%; c) Lexiscan Cardiolite 02/2014: EF 61%, No Ischemia; subtle fixed anteroseptal defect.  . Cardiac pacemaker in [place]  . Coronary atherosclerosis of native coronary artery   . Diverticulosis of colon (without mention of hemorrhage)   . DJD (degenerative joint disease)   . Esophageal reflux   . Essential hypertension, benign   . GERD (gastroesophageal reflux disease)   .  Headache    after brain aneurysm  . History of tuberculosis    remote Hx  . Inflamed seborrheic keratosis   . Intermittent confusion   . Internal hemorrhoids    severe  . Mixed hyperlipidemia   . Mouth burn   . Myocardial infarction   . Near syncope    uncertain cause. R/O arrhythmia, R/O med effect  . Osteoarthritis 06/14/2012  . Peptic ulcer, unspecified site, unspecified as acute or chronic, without mention of hemorrhage, perforation, or obstruction   . Personal history of colonic polyps   . Pneumonia   . Presbycusis of both ears    Bilateral hearing aids  . Presence of permanent cardiac pacemaker   . Prostatitis dx 12/2102   E coli Ucx  . SSS (sick sinus syndrome) (HCC)    syncope, s/p ppm 12/12  . Systolic murmur     Worrisome for AS. AS could cause exertional fatigue.    ROS:   All systems reviewed and negative except as noted in the HPI.   Past Surgical History:  Procedure Laterality Date  . CARDIAC CATHETERIZATION  2002  . cataracts     bilateral  . COLONOSCOPY    . CORONARY ARTERY BYPASS GRAFT  1997   (LIMA to LAD, SVG to diagonal-50% closed on catheterization in 1999, SVG to OM1, SVG to PDA) Repeat cath 2009 with patent grafts  . EXCISION OF TONGUE LESION    . EYE SURGERY Bilateral    cataract surgery with lens implant  . HEMORRHOID SURGERY  07/31/2010  . INSERT / REPLACE / REMOVE PACEMAKER  08/27/2011   PPM implant  . IR GENERIC HISTORICAL  04/06/2016   IR ANGIO VERTEBRAL SEL SUBCLAVIAN INNOMINATE UNI L MOD SED 04/06/2016 Shaun Bras, MD [facility]  . IR GENERIC HISTORICAL  04/06/2016   IR ANGIO VERTEBRAL SEL VERTEBRAL UNI R MOD SED 04/06/2016 Shaun Bras, MD [facility]  . IR GENERIC HISTORICAL  04/06/2016   IR ANGIO INTRA EXTRACRAN SEL COM CAROTID INNOMINATE BILAT MOD SED 04/06/2016 Shaun Bras, MD [facility]  . IR GENERIC HISTORICAL  05/19/2016   IR ANGIO INTRA EXTRACRAN SEL INTERNAL CAROTID UNI L MOD SED 05/19/2016 Shaun Bras, MD [facility]  . IR GENERIC HISTORICAL  05/19/2016   IR NEURO EACH ADD'L AFTER BASIC UNI LEFT (MS) 05/19/2016 Shaun Bras, MD [facility]  . IR GENERIC HISTORICAL  05/19/2016   IR ANGIOGRAM FOLLOW UP STUDY 05/19/2016 Shaun Bras, MD [facility]  . IR GENERIC HISTORICAL  05/19/2016   IR ANGIO VERTEBRAL SEL VERTEBRAL UNI L MOD SED 05/19/2016 Shaun Bras, MD [facility]  . IR GENERIC HISTORICAL  05/19/2016   IR TRANSCATH/EMBOLIZ 05/19/2016 Shaun Bras, MD [facility]  . IR GENERIC HISTORICAL  05/19/2016   IR 3D INDEPENDENT WKST 05/19/2016 Shaun Bras, MD [facility]  . IR GENERIC HISTORICAL  06/03/2016   IR RADIOLOGIST EVAL & MGMT 06/03/2016 [facility]  . IR GENERIC HISTORICAL  07/20/2016   IR RADIOLOGIST  EVAL & MGMT 07/20/2016 [facility]  . left rotator cuff surgery  2000  . PERMANENT PACEMAKER INSERTION N/A 08/27/2011   Procedure: PERMANENT PACEMAKER INSERTION;  Surgeon: Evans Lance, MD;  Location: [facility] CATH LAB;  Service: Cardiovascular;  Laterality: N/A;  . PUNCH BIOPSY OF SKIN  08/2009   5 mm punch biopsy on upper mid back melanotic appearing lesion  . RADIOLOGY WITH ANESTHESIA N/A 05/19/2016   Procedure: Embolization;  Surgeon: Shaun Bras, MD;  Location: [place];  Service:  Radiology;  Laterality: N/A;     Family History  Problem Relation Age of Onset  . Other Father 45    [place]  . Heart attack Mother 95  . Heart disease Mother     ASHD  . COPD Brother   . Hypertension Brother   . Heart disease Brother     ASHD  . Heart disease Brother     ASHD  . Heart disease Sister     ASHD  . COPD Brother   . Coronary artery disease       Social History   Social History  . Marital status: Married    Spouse name: N/A  . Number of children: 4  . Years of education: N/A   Occupational History  . retired     retired Art gallery manager   Social History Main Topics  . Smoking status: Former Smoker    Packs/day: 0.50    Years: 15.00    Types: Cigarettes    Quit date: 09/13/1965  . Smokeless tobacco: Never Used  . Alcohol use No  . Drug use: No  . Sexual activity: Not Currently   Other Topics Concern  . Not on file   Social History Narrative   Moved from [place] in [place] alone; lives in 2 story home but stays downstairs   3 children / 1 other;      BP 122/82   Pulse (!) 59   Ht 5\' 3"  (1.6 m)   Wt 131 lb 6.4 oz (59.6 kg)   BMI 23.28 kg/m   Physical Exam:  Stable appearing elderly man, NAD HEENT: Unremarkable Neck:  6 cm JVD, no thyromegally Back:  No CVA tenderness Lungs:  Clear with no wheezes HEART:  Regular rate rhythm, no murmurs, no rubs, no clicks Abd:  soft, positive bowel sounds, no organomegally, no rebound, no guarding Ext:  2  plus pulses, no edema, no cyanosis, no clubbing Skin:  No rashes no nodules Neuro:  CN II through XII intact, motor grossly intact   DEVICE  Normal device function.  See PaceArt for details.   Assess/Plan: 1. PPM - his [place] DDD PM is working normally. Will recheck in several months. 2. HTN - his blood pressure is stable. I will continue his current meds. 3. Atrial fib - he is maintaining NSR. He will continue systemic anti-coagulation with Eliquis 4. Coronary artery disease - he denies anginal symptoms. Will follow.  Mikle Bosworth.D.

## 2016-12-15 ENCOUNTER — Telehealth: Payer: Self-pay | Admitting: Internal Medicine

## 2016-12-15 NOTE — Telephone Encounter (Signed)
Please advise 

## 2016-12-15 NOTE — Telephone Encounter (Signed)
States last time he was in he was told to stop taking his BP medication because Bp was low, but he has actually been off of it since November of last year. Wants to know if they need to do anything.  Please advise.

## 2016-12-15 NOTE — Telephone Encounter (Signed)
No he should remain off of it.  Advise him to bring a medication list to every appt.

## 2016-12-15 NOTE — Telephone Encounter (Signed)
Spoke with pts daughter to inform.  

## 2016-12-17 ENCOUNTER — Other Ambulatory Visit: Payer: Self-pay | Admitting: Interventional Cardiology

## 2016-12-17 NOTE — Telephone Encounter (Signed)
Pt last saw Dr Lovena Le 12/14/16, last labs on 09/11/16 Creat 0.95, age 83, weight 59.6kg, pt is on appropriate dosage of Eliquis 2.5mg  BID.  Will refill rx.

## 2016-12-24 ENCOUNTER — Telehealth: Payer: Self-pay | Admitting: Internal Medicine

## 2016-12-24 DIAGNOSIS — R270 Ataxia, unspecified: Secondary | ICD-10-CM

## 2016-12-24 NOTE — Telephone Encounter (Signed)
Pt daughter states pt is unstable and would like a prescription sent in for a walker.   [facility]

## 2016-12-24 NOTE — Telephone Encounter (Signed)
Please advise 

## 2016-12-24 NOTE — Telephone Encounter (Signed)
Ok - should I order?

## 2016-12-28 NOTE — Telephone Encounter (Signed)
Spoke with pts daughter, she agreed that a rolling walker would be beneficial to pt. Order is pending, I just need the proper DX code.

## 2016-12-28 NOTE — Telephone Encounter (Signed)
Associated with ataxia order signed

## 2017-01-05 ENCOUNTER — Telehealth: Payer: Self-pay | Admitting: Emergency Medicine

## 2017-01-05 NOTE — Assessment & Plan Note (Signed)
Poor balance / gait since CVA, aneurysm surgery Would benefit from a walker Stay active Consider PT

## 2017-01-05 NOTE — Telephone Encounter (Signed)
Note addended

## 2017-01-05 NOTE — Telephone Encounter (Signed)
Are you able to addend the OV note on 12/13/16 to mention that pt is in need of a rolling walker. Please advise.

## 2017-01-06 NOTE — Telephone Encounter (Signed)
OV notes faxed to [facility].

## 2017-02-16 ENCOUNTER — Ambulatory Visit (INDEPENDENT_AMBULATORY_CARE_PROVIDER_SITE_OTHER): Payer: Medicare Other | Admitting: Internal Medicine

## 2017-02-16 ENCOUNTER — Encounter: Payer: Self-pay | Admitting: Internal Medicine

## 2017-02-16 DIAGNOSIS — N400 Enlarged prostate without lower urinary tract symptoms: Secondary | ICD-10-CM

## 2017-02-16 DIAGNOSIS — I671 Cerebral aneurysm, nonruptured: Secondary | ICD-10-CM

## 2017-02-16 DIAGNOSIS — I10 Essential (primary) hypertension: Secondary | ICD-10-CM | POA: Diagnosis not present

## 2017-02-16 DIAGNOSIS — K219 Gastro-esophageal reflux disease without esophagitis: Secondary | ICD-10-CM | POA: Diagnosis not present

## 2017-02-16 DIAGNOSIS — R270 Ataxia, unspecified: Secondary | ICD-10-CM

## 2017-02-16 DIAGNOSIS — R4701 Aphasia: Secondary | ICD-10-CM

## 2017-02-16 DIAGNOSIS — R200 Anesthesia of skin: Secondary | ICD-10-CM

## 2017-02-16 DIAGNOSIS — R202 Paresthesia of skin: Secondary | ICD-10-CM

## 2017-02-16 MED ORDER — TICAGRELOR 90 MG PO TABS: 90.0000 mg | ORAL_TABLET | Freq: Two times a day (BID) | ORAL | 2 refills | Status: AC

## 2017-02-16 NOTE — Assessment & Plan Note (Signed)
Was related to in-stent stenosis after repair of cerebral aneurysm Has improved with physical therapy and angioplasty of the stent Walks daily, uses cane

## 2017-02-16 NOTE — Assessment & Plan Note (Signed)
He did see orthopedics Not related to CVA per neurosurgery EMG showed chronic C8 radiculopathy-mild in severity

## 2017-02-16 NOTE — Assessment & Plan Note (Signed)
Controlled, stable Continue current dose of medication  

## 2017-02-16 NOTE — Assessment & Plan Note (Signed)
Was related to in-stent stenosis after repair of cerebral aneurysm Has improved with speech therapy and angioplasty of the stent

## 2017-02-16 NOTE — Assessment & Plan Note (Signed)
BP well controlled Current regimen effective and well tolerated Continue current medications at current doses  

## 2017-02-16 NOTE — Assessment & Plan Note (Signed)
Repeat cerebral angiogram this month On 2 blood thinners-after cerebral angiogram in aVL to discontinue one-to be determined by neurosurgery

## 2017-02-16 NOTE — Assessment & Plan Note (Signed)
GERD controlled Continue daily medication  

## 2017-02-16 NOTE — Progress Notes (Signed)
Subjective:    Patient ID: Shaun James, male    DOB: Sep 14, 1930, 83 y.o.   MRN: [id number]  HPI He is here for follow up.  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 He continues on her eliquis and Brinlinta. He will be having a repeat cerebral angiogram the end of this month and he may be able to come off of one of his blood thinners. His speech has improved and he considers it to be very good. His balance has improved. He is walking daily and using a cane.  Right arm numbness: He continues to have numbness in his right arm. This is not secondary to the CVA or the neurosurgeon. He did have atraumatic IV placement when he was in the hospital and had a large hematoma in his forearm, which may have been the cause. He also has intermittent elbow pain may have a pinched nerve at the elbow. He has seen orthopedics in the past for this.  Hypertension: He is taking his medication daily as prescribed. He denies any chest pain, palpitations, shortness of breath, lower extremity edema, headaches and lightheadedness.  Hyperlipidemia: He is taking his medication daily. He is compliant with a low fat/cholesterol diet. He is exercising regularly. He denies myalgias.   BPH: He is taking the Flomax daily as prescribed. He denies any difficulty initiating urination, dysuria or hematuria.  GERD:  He is taking his medication daily as prescribed.  He denies any GERD symptoms and feels his GERD is well controlled.    Medications and allergies reviewed with patient and updated if appropriate.  Patient Active Problem List   Diagnosis Date Noted  . Cervical radiculopathy at C8 10/08/2016  . Paroxysmal atrial fibrillation (HCC)   . Right hand weakness 08/18/2016  . Expressive aphasia 08/18/2016  . Essential hypertension, benign 08/17/2016  . Numbness and tingling of right arm 08/17/2016  . AAA (abdominal aortic aneurysm) without  rupture ([place]) 04/25/2016  . Pulmonary nodule 04/25/2016  . Bilateral hearing loss 03/22/2016  . Aneurysm, cerebral, nonruptured 03/11/2016  . Carotid artery stenosis 03/11/2016  . Vertigo 03/10/2016  . BPPV (benign paroxysmal positional vertigo) 03/10/2016  . Ataxia   . Rhinorrhea 08/12/2015  . Burning tongue 08/12/2015  . GERD (gastroesophageal reflux disease) 08/12/2015  . BPH without urinary obstruction 04/10/2015  . 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 Insomnia 05/22/2014  . Chronic headache   . Osteoarthritis   . Cardiac pacemaker in situ 10/18/2011  . CAD (coronary artery disease), autologous vein bypass graft 09/13/2011  . Sick sinus syndrome ([place]) 09/13/2011  . Hyperlipidemia with target LDL less than 70 04/16/2008  . Acute myocardial infarction ([place]) 04/16/2008  . Pulmonary fibrosis ([place]) 04/16/2008    Current Outpatient Prescriptions on File Prior to Visit  Medication Sig Dispense Refill  . acetaminophen (TYLENOL) 500 MG tablet Take 1 tablet (500 mg total) by mouth daily as needed for headache. 14 tablet 0  . CARTIA XT 120 MG 24 hr capsule TAKE 1 CAPSULE DAILY 90 capsule 3  . ELIQUIS 2.5 MG TABS tablet TAKE 1 TABLET TWICE A DAY 180 tablet 1  . nitroGLYCERIN (NITROSTAT) 0.4 MG SL tablet Place 0.4 mg under the tongue every 5 (five) minutes as needed for chest pain.    . ranitidine (ZANTAC) 150 MG tablet TAKE 1 TABLET (150 MG TOTAL) BY MOUTH 2 TIMES DAILY.  2  . ranolazine (RANEXA) 500 MG 12 hr tablet  Take 1 tablet (500 mg total) by mouth 2 (two) times daily. 180 tablet 3  . tamsulosin (FLOMAX) 0.4 MG CAPS capsule Take 1 capsule (0.4 mg total) by mouth daily. 90 capsule 2  . atorvastatin (LIPITOR) 10 MG tablet Take 10 mg by mouth daily at 6 PM.     No current facility-administered medications on file prior to visit.     Past Medical History:  Diagnosis Date  . AAA (abdominal aortic aneurysm) (HCC)    3.1 cm AAA, reevaluated 08/2009 per ultrasound - stable  .  Allergic rhinitis   . Aortic sclerosis    Probable AS on physical exam, 2010  . Aortic stenosis    mild AS 2014 echo  . Arthritis   . Atrial fibrillation (HCC)    chronic anticoag  . Atrial flutter ([place])   . Basal cell carcinoma   . Benign positional vertigo   . BPH (benign prostatic hyperplasia)   . CAD in native artery    a) s/p CABG '98; LIMA-LAD, SVG-D1, SVG-OM1, SVG-PDA); b) CATH -2009: midLAD & RCA 100%, OM2 100% wtih severe native Cx; Grafts patent with ~30-50% SVG-D1 & ~30-40% SVG-OM, EF 45%; c) Lexiscan Cardiolite 02/2014: EF 61%, No Ischemia; subtle fixed anteroseptal defect.  . Cardiac pacemaker in [place]  . Coronary atherosclerosis of native coronary artery   . Diverticulosis of colon (without mention of hemorrhage)   . DJD (degenerative joint disease)   . Esophageal reflux   . Essential hypertension, benign   . GERD (gastroesophageal reflux disease)   . Headache    after brain aneurysm  . History of tuberculosis    remote Hx  . Inflamed seborrheic keratosis   . Intermittent confusion   . Internal hemorrhoids    severe  . Mixed hyperlipidemia   . Mouth burn   . Myocardial infarction ([place])   . Near syncope    uncertain cause. R/O arrhythmia, R/O med effect  . Osteoarthritis 06/14/2012  . Peptic ulcer, unspecified site, unspecified as acute or chronic, without mention of hemorrhage, perforation, or obstruction   . Personal history of colonic polyps   . Pneumonia   . Presbycusis of both ears    Bilateral hearing aids  . Presence of permanent cardiac pacemaker   . Prostatitis dx 12/2102   E coli Ucx  . SSS (sick sinus syndrome) (HCC)    syncope, s/p ppm 12/12  . Systolic murmur    Worrisome for AS. AS could cause exertional fatigue.    Past Surgical History:  Procedure Laterality Date  . CARDIAC CATHETERIZATION  2002  . cataracts     bilateral  . COLONOSCOPY    . CORONARY ARTERY BYPASS GRAFT  1997   (LIMA to LAD, SVG to diagonal-50%  closed on catheterization in 1999, SVG to OM1, SVG to PDA) Repeat cath 2009 with patent grafts  . EXCISION OF TONGUE LESION    . EYE SURGERY Bilateral    cataract surgery with lens implant  . HEMORRHOID SURGERY  07/31/2010  . INSERT / REPLACE / REMOVE PACEMAKER  08/27/2011   PPM implant  . IR GENERIC HISTORICAL  04/06/2016   IR ANGIO VERTEBRAL SEL SUBCLAVIAN INNOMINATE UNI L MOD SED 04/06/2016 Luanne Bras, MD [facility]  . IR GENERIC HISTORICAL  04/06/2016   IR ANGIO VERTEBRAL SEL VERTEBRAL UNI R MOD SED 04/06/2016 Luanne Bras, MD [facility]  . IR GENERIC HISTORICAL  04/06/2016   IR ANGIO INTRA EXTRACRAN SEL COM CAROTID  INNOMINATE BILAT MOD SED 04/06/2016 Luanne Bras, MD [facility]  . IR GENERIC HISTORICAL  05/19/2016   IR ANGIO INTRA EXTRACRAN SEL INTERNAL CAROTID UNI L MOD SED 05/19/2016 Luanne Bras, MD [facility]  . IR GENERIC HISTORICAL  05/19/2016   IR NEURO EACH ADD'L AFTER BASIC UNI LEFT (MS) 05/19/2016 Luanne Bras, MD [facility]  . IR GENERIC HISTORICAL  05/19/2016   IR ANGIOGRAM FOLLOW UP STUDY 05/19/2016 Luanne Bras, MD [facility]  . IR GENERIC HISTORICAL  05/19/2016   IR ANGIO VERTEBRAL SEL VERTEBRAL UNI L MOD SED 05/19/2016 Luanne Bras, MD [facility]  . IR GENERIC HISTORICAL  05/19/2016   IR TRANSCATH/EMBOLIZ 05/19/2016 Luanne Bras, MD [facility]  . IR GENERIC HISTORICAL  05/19/2016   IR 3D INDEPENDENT WKST 05/19/2016 Luanne Bras, MD [facility]  . IR GENERIC HISTORICAL  06/03/2016   IR RADIOLOGIST EVAL & MGMT 06/03/2016 [facility]  . IR GENERIC HISTORICAL  07/20/2016   IR RADIOLOGIST EVAL & MGMT 07/20/2016 [facility]  . left rotator cuff surgery  2000  . PERMANENT PACEMAKER INSERTION N/A 08/27/2011   Procedure: PERMANENT PACEMAKER INSERTION;  Surgeon: Evans Lance, MD;  Location: [facility] CATH LAB;  Service: Cardiovascular;  Laterality: N/A;  . PUNCH BIOPSY OF SKIN  08/2009   5 mm punch biopsy on upper mid back melanotic  appearing lesion  . RADIOLOGY WITH ANESTHESIA N/A 05/19/2016   Procedure: Embolization;  Surgeon: Luanne Bras, MD;  Location: [place];  Service: Radiology;  Laterality: N/A;    Social History   Social History  . Marital status: Married    Spouse name: N/A  . Number of children: 4  . Years of education: N/A   Occupational History  . retired     retired Art gallery manager   Social History Main Topics  . Smoking status: Former Smoker    Packs/day: 0.50    Years: 15.00    Types: Cigarettes    Quit date: 09/13/1965  . Smokeless tobacco: Never Used  . Alcohol use No  . Drug use: No  . Sexual activity: Not Currently   Other Topics Concern  . Not on file   Social History Narrative   Moved from [place] in [place] alone; lives in 2 story home but stays downstairs   3 children / 1 other;     Family History  Problem Relation Age of Onset  . Other Father 25       [place]  . Heart attack Mother 58  . Heart disease Mother        ASHD  . COPD Brother   . Hypertension Brother   . Heart disease Brother        ASHD  . Heart disease Brother        ASHD  . Heart disease Sister        ASHD  . COPD Brother   . Coronary artery disease Unknown     Review of Systems  Constitutional: Negative for appetite change and fever.  HENT: Negative for trouble swallowing.   Respiratory: Negative for cough and shortness of breath.   Cardiovascular: Negative for chest pain, palpitations and leg swelling.  Gastrointestinal: Negative for abdominal pain and blood in stool.       Rare GERD  Genitourinary: Negative for difficulty urinating, dysuria and hematuria.  Musculoskeletal: Positive for gait problem.  Neurological: Positive for dizziness (intermittent), numbness (right froearm) and headaches (left posterior ). Negative for light-headedness.  Psychiatric/Behavioral:  Positive for sleep disturbance.       Objective:   Vitals:   02/16/17 0853  BP: 120/70  Pulse: (!) 59    Resp: 16  Temp: 97.8 F (36.6 C)   Filed Weights   02/16/17 0853  Weight: 129 lb (58.5 kg)   Body mass index is 22.85 kg/m.  Wt Readings from Last 3 Encounters:  02/16/17 129 lb (58.5 kg)  12/14/16 131 lb 6.4 oz (59.6 kg)  12/13/16 130 lb (59 kg)     Physical Exam Constitutional: Appears well-developed and well-nourished. No distress.  HENT:  Head: Normocephalic and atraumatic.  Neck: Neck supple. No tracheal deviation present. No thyromegaly present.  No cervical lymphadenopathy Cardiovascular: Normal rate, regular rhythm and normal heart sounds.   No murmur heard. No carotid bruit .  No edema Pulmonary/Chest: Effort normal and breath sounds normal. No respiratory distress. No has no wheezes. No rales.  Skin: Skin is warm and dry. Not diaphoretic.  Psychiatric: Normal mood and affect. Behavior is normal.         Assessment & Plan:   See Problem List for Assessment and Plan of chronic medical problems.

## 2017-02-16 NOTE — Patient Instructions (Addendum)
  No immunizations administered today.   Medications reviewed and updated.  No changes recommended at this time.    Please followup in 6 months   

## 2017-02-27 ENCOUNTER — Encounter (HOSPITAL_COMMUNITY): Payer: Self-pay | Admitting: Emergency Medicine

## 2017-02-27 ENCOUNTER — Ambulatory Visit (HOSPITAL_COMMUNITY)
Admission: EM | Admit: 2017-02-27 | Discharge: 2017-02-27 | Disposition: A | Discharge status: Home / Self Care | Payer: Medicare Other | Attending: Internal Medicine | Admitting: Internal Medicine

## 2017-02-27 ENCOUNTER — Telehealth (HOSPITAL_COMMUNITY): Payer: Self-pay | Admitting: *Deleted

## 2017-02-27 DIAGNOSIS — Z87891 Personal history of nicotine dependence: Secondary | ICD-10-CM | POA: Diagnosis not present

## 2017-02-27 DIAGNOSIS — Z79899 Other long term (current) drug therapy: Secondary | ICD-10-CM | POA: Insufficient documentation

## 2017-02-27 DIAGNOSIS — I2510 Atherosclerotic heart disease of native coronary artery without angina pectoris: Secondary | ICD-10-CM | POA: Diagnosis not present

## 2017-02-27 DIAGNOSIS — Z950 Presence of cardiac pacemaker: Secondary | ICD-10-CM | POA: Insufficient documentation

## 2017-02-27 DIAGNOSIS — Z880 Allergy status to penicillin: Secondary | ICD-10-CM | POA: Insufficient documentation

## 2017-02-27 DIAGNOSIS — K219 Gastro-esophageal reflux disease without esophagitis: Secondary | ICD-10-CM | POA: Insufficient documentation

## 2017-02-27 DIAGNOSIS — E782 Mixed hyperlipidemia: Secondary | ICD-10-CM | POA: Insufficient documentation

## 2017-02-27 DIAGNOSIS — N390 Urinary tract infection, site not specified: Secondary | ICD-10-CM

## 2017-02-27 DIAGNOSIS — R300 Dysuria: Secondary | ICD-10-CM | POA: Diagnosis not present

## 2017-02-27 DIAGNOSIS — I252 Old myocardial infarction: Secondary | ICD-10-CM | POA: Diagnosis not present

## 2017-02-27 DIAGNOSIS — I10 Essential (primary) hypertension: Secondary | ICD-10-CM | POA: Insufficient documentation

## 2017-02-27 DIAGNOSIS — R110 Nausea: Secondary | ICD-10-CM

## 2017-02-27 DIAGNOSIS — Z951 Presence of aortocoronary bypass graft: Secondary | ICD-10-CM | POA: Insufficient documentation

## 2017-02-27 DIAGNOSIS — I4892 Unspecified atrial flutter: Secondary | ICD-10-CM | POA: Insufficient documentation

## 2017-02-27 DIAGNOSIS — Z7901 Long term (current) use of anticoagulants: Secondary | ICD-10-CM | POA: Insufficient documentation

## 2017-02-27 DIAGNOSIS — I4891 Unspecified atrial fibrillation: Secondary | ICD-10-CM | POA: Insufficient documentation

## 2017-02-27 LAB — POCT URINALYSIS DIP (DEVICE)
Bilirubin Urine: NEGATIVE
GLUCOSE, UA: NEGATIVE mg/dL
KETONES UR: NEGATIVE mg/dL
Nitrite: POSITIVE — AB
PROTEIN: NEGATIVE mg/dL
Specific Gravity, Urine: 1.015 (ref 1.005–1.030)
UROBILINOGEN UA: 0.2 mg/dL (ref 0.0–1.0)
pH: 6 (ref 5.0–8.0)

## 2017-02-27 MED ORDER — CEPHALEXIN 500 MG PO CAPS: 500.0000 mg | ORAL_CAPSULE | Freq: Four times a day (QID) | ORAL | 0 refills | Status: DC

## 2017-02-27 MED ORDER — ONDANSETRON 4 MG PO TBDP
ORAL_TABLET | ORAL | Status: AC
  Filled 2017-02-27: qty 1

## 2017-02-27 MED ORDER — ONDANSETRON 4 MG PO TBDP: 4.0000 mg | ORAL_TABLET | Freq: Three times a day (TID) | ORAL | 0 refills | Status: DC | PRN

## 2017-02-27 MED ORDER — ONDANSETRON 4 MG PO TBDP
4.0000 mg | ORAL_TABLET | Freq: Once | ORAL | Status: AC
  Administered 2017-02-27: 4 mg via ORAL

## 2017-02-27 NOTE — ED Triage Notes (Signed)
Pt c/o UTI sx onset 3 days associated w/dizziness, urinary freq, weakness, nausea vomiting, HA  Hx of UTIs  Denies fevers  Brought back on wheel chair

## 2017-02-27 NOTE — Discharge Instructions (Signed)
Please follow up with your primary care doctor next week.  If symptoms worsen please follow up in the Emergency Department.

## 2017-02-27 NOTE — ED Provider Notes (Signed)
CSN: [id number]     Arrival date & time 02/27/17  1842 History   None    Chief Complaint  Patient presents with  . Urinary Tract Infection   (Consider location/radiation/quality/duration/timing/severity/associated sxs/prior Treatment) Patient c/o nausea and dysuria.  He has had 3 UTI 's this year.   The history is provided by the patient.  Urinary Tract Infection  This is a new problem. The problem has been rapidly worsening. Nothing aggravates the symptoms.    Past Medical History:  Diagnosis Date  . AAA (abdominal aortic aneurysm) (HCC)    3.1 cm AAA, reevaluated 08/2009 per ultrasound - stable  . Allergic rhinitis   . Aortic sclerosis    Probable AS on physical exam, 2010  . Aortic stenosis    mild AS 2014 echo  . Arthritis   . Atrial fibrillation (HCC)    chronic anticoag  . Atrial flutter ([place])   . Basal cell carcinoma   . Benign positional vertigo   . BPH (benign prostatic hyperplasia)   . CAD in native artery    a) s/p CABG '98; LIMA-LAD, SVG-D1, SVG-OM1, SVG-PDA); b) CATH -2009: midLAD & RCA 100%, OM2 100% wtih severe native Cx; Grafts patent with ~30-50% SVG-D1 & ~30-40% SVG-OM, EF 45%; c) Lexiscan Cardiolite 02/2014: EF 61%, No Ischemia; subtle fixed anteroseptal defect.  . Cardiac pacemaker in [place]  . Coronary atherosclerosis of native coronary artery   . Diverticulosis of colon (without mention of hemorrhage)   . DJD (degenerative joint disease)   . Esophageal reflux   . Essential hypertension, benign   . GERD (gastroesophageal reflux disease)   . Headache    after brain aneurysm  . History of tuberculosis    remote Hx  . Inflamed seborrheic keratosis   . Intermittent confusion   . Internal hemorrhoids    severe  . Mixed hyperlipidemia   . Mouth burn   . Myocardial infarction ([place])   . Near syncope    uncertain cause. R/O arrhythmia, R/O med effect  . Osteoarthritis 06/14/2012  . Peptic ulcer, unspecified site, unspecified as  acute or chronic, without mention of hemorrhage, perforation, or obstruction   . Personal history of colonic polyps   . Pneumonia   . Presbycusis of both ears    Bilateral hearing aids  . Presence of permanent cardiac pacemaker   . Prostatitis dx 12/2102   E coli Ucx  . SSS (sick sinus syndrome) (HCC)    syncope, s/p ppm 12/12  . Systolic murmur    Worrisome for AS. AS could cause exertional fatigue.   Past Surgical History:  Procedure Laterality Date  . CARDIAC CATHETERIZATION  2002  . cataracts     bilateral  . COLONOSCOPY    . CORONARY ARTERY BYPASS GRAFT  1997   (LIMA to LAD, SVG to diagonal-50% closed on catheterization in 1999, SVG to OM1, SVG to PDA) Repeat cath 2009 with patent grafts  . EXCISION OF TONGUE LESION    . EYE SURGERY Bilateral    cataract surgery with lens implant  . HEMORRHOID SURGERY  07/31/2010  . INSERT / REPLACE / REMOVE PACEMAKER  08/27/2011   PPM implant  . IR GENERIC HISTORICAL  04/06/2016   IR ANGIO VERTEBRAL SEL SUBCLAVIAN INNOMINATE UNI L MOD SED 04/06/2016 Luanne Bras, MD [facility]  . IR GENERIC HISTORICAL  04/06/2016   IR ANGIO VERTEBRAL SEL VERTEBRAL UNI R MOD SED 04/06/2016 Luanne Bras, MD [facility]  . IR  GENERIC HISTORICAL  04/06/2016   IR ANGIO INTRA EXTRACRAN SEL COM CAROTID INNOMINATE BILAT MOD SED 04/06/2016 Luanne Bras, MD [facility]  . IR GENERIC HISTORICAL  05/19/2016   IR ANGIO INTRA EXTRACRAN SEL INTERNAL CAROTID UNI L MOD SED 05/19/2016 Luanne Bras, MD [facility]  . IR GENERIC HISTORICAL  05/19/2016   IR NEURO EACH ADD'L AFTER BASIC UNI LEFT (MS) 05/19/2016 Luanne Bras, MD [facility]  . IR GENERIC HISTORICAL  05/19/2016   IR ANGIOGRAM FOLLOW UP STUDY 05/19/2016 Luanne Bras, MD [facility]  . IR GENERIC HISTORICAL  05/19/2016   IR ANGIO VERTEBRAL SEL VERTEBRAL UNI L MOD SED 05/19/2016 Luanne Bras, MD [facility]  . IR GENERIC HISTORICAL  05/19/2016   IR TRANSCATH/EMBOLIZ 05/19/2016 Luanne Bras, MD [facility]  . IR GENERIC HISTORICAL  05/19/2016   IR 3D INDEPENDENT WKST 05/19/2016 Luanne Bras, MD [facility]  . IR GENERIC HISTORICAL  06/03/2016   IR RADIOLOGIST EVAL & MGMT 06/03/2016 [facility]  . IR GENERIC HISTORICAL  07/20/2016   IR RADIOLOGIST EVAL & MGMT 07/20/2016 [facility]  . left rotator cuff surgery  2000  . PERMANENT PACEMAKER INSERTION N/A 08/27/2011   Procedure: PERMANENT PACEMAKER INSERTION;  Surgeon: Evans Lance, MD;  Location: [facility] CATH LAB;  Service: Cardiovascular;  Laterality: N/A;  . PUNCH BIOPSY OF SKIN  08/2009   5 mm punch biopsy on upper mid back melanotic appearing lesion  . RADIOLOGY WITH ANESTHESIA N/A 05/19/2016   Procedure: Embolization;  Surgeon: Luanne Bras, MD;  Location: [facility];  Service: Radiology;  Laterality: N/A;   Family History  Problem Relation Age of Onset  . Other Father 27       [place]  . Heart attack Mother 26  . Heart disease Mother        ASHD  . COPD Brother   . Hypertension Brother   . Heart disease Brother        ASHD  . Heart disease Brother        ASHD  . Heart disease Sister        ASHD  . COPD Brother   . Coronary artery disease Unknown    Social History  Substance Use Topics  . Smoking status: Former Smoker    Packs/day: 0.50    Years: 15.00    Types: Cigarettes    Quit date: 09/13/1965  . Smokeless tobacco: Never Used  . Alcohol use No    Review of Systems  Constitutional: Negative.   HENT: Negative.   Eyes: Negative.   Respiratory: Negative.   Cardiovascular: Negative.   Gastrointestinal: Negative.   Endocrine: Negative.   Genitourinary: Positive for difficulty urinating and dysuria.  Musculoskeletal: Negative.   Skin: Negative.   Allergic/Immunologic: Negative.   Neurological: Negative.   Hematological: Negative.   Psychiatric/Behavioral: Negative.     Allergies  Codeine; Penicillin g; Penicillins; and Zyrtec [cetirizine hcl]  Home Medications   Prior to  Admission medications   Medication Sig Start Date End Date Taking? Authorizing Provider  ELIQUIS 2.5 MG TABS tablet TAKE 1 TABLET TWICE A DAY 12/17/16  Yes Belva Crome, MD  nitroGLYCERIN (NITROSTAT) 0.4 MG SL tablet Place 0.4 mg under the tongue every 5 (five) minutes as needed for chest pain.   Yes [provider]  ranitidine (ZANTAC) 150 MG tablet TAKE 1 TABLET (150 MG TOTAL) BY MOUTH 2 TIMES DAILY. 09/22/16  Yes [provider]  ranolazine (RANEXA) 500 MG 12 hr tablet  Take 1 tablet (500 mg total) by mouth 2 (two) times daily. 07/05/16  Yes Belva Crome, MD  simvastatin (ZOCOR) 20 MG tablet  01/05/17  Yes [provider]  tamsulosin (FLOMAX) 0.4 MG CAPS capsule Take 1 capsule (0.4 mg total) by mouth daily. 07/26/16  Yes Burns, Claudina Lick, MD  ticagrelor (BRILINTA) 90 MG TABS tablet Take 1 tablet (90 mg total) by mouth 2 (two) times daily. 02/16/17 05/17/17 Yes Burns, Claudina Lick, MD  acetaminophen (TYLENOL) 500 MG tablet Take 1 tablet (500 mg total) by mouth daily as needed for headache. 05/12/14   Delos Haring, PA-C  CARTIA XT 120 MG 24 hr capsule TAKE 1 CAPSULE DAILY 10/14/16   Belva Crome, MD  cephALEXin (KEFLEX) 500 MG capsule Take 1 capsule (500 mg total) by mouth 4 (four) times daily. 02/27/17   Lysbeth Penner, FNP  ondansetron (ZOFRAN ODT) 4 MG disintegrating tablet Take 1 tablet (4 mg total) by mouth every 8 (eight) hours as needed for nausea or vomiting. 02/27/17   Lysbeth Penner, FNP   Meds Ordered and Administered this Visit   Medications  ondansetron (ZOFRAN-ODT) disintegrating tablet 4 mg (4 mg Oral Given 02/27/17 2029)    BP (!) 129/49 (BP Location: Left Arm)   Pulse 73   Temp 98 F (36.7 C) (Oral)   Resp 16   SpO2 98%  No data found.   Physical Exam  Constitutional: He is oriented to person, place, and time. He appears well-developed and well-nourished.  HENT:  Head: Normocephalic and atraumatic.  Eyes: Conjunctivae and EOM are normal. Pupils  are equal, round, and reactive to light.  Neck: Normal range of motion. Neck supple.  Cardiovascular: Normal rate, regular rhythm and normal heart sounds.   Pulmonary/Chest: Effort normal and breath sounds normal.  Abdominal: Soft. Bowel sounds are normal.  Neurological: He is alert and oriented to person, place, and time.  Nursing note and vitals reviewed.   Urgent Care Course     Procedures (including critical care time)  Labs Review Labs Reviewed  POCT URINALYSIS DIP (DEVICE) - Abnormal; Notable for the following:       Result Value   Hgb urine dipstick TRACE (*)    Nitrite POSITIVE (*)    Leukocytes, UA TRACE (*)    All other components within normal limits  URINE CULTURE    Imaging Review No results found.   Visual Acuity Review  Right Eye Distance:   Left Eye Distance:   Bilateral Distance:    Right Eye Near:   Left Eye Near:    Bilateral Near:         MDM   1. Lower urinary tract infectious disease    Keflex 500mg  one po qid x 10 days #40 UA cx      Lysbeth Penner, FNP 02/27/17 2048

## 2017-03-02 LAB — URINE CULTURE: Culture: >=100000 — AB

## 2017-03-10 ENCOUNTER — Other Ambulatory Visit: Payer: Self-pay | Admitting: Internal Medicine

## 2017-03-10 NOTE — Telephone Encounter (Signed)
Not on current med list, please advise

## 2017-03-15 ENCOUNTER — Ambulatory Visit (INDEPENDENT_AMBULATORY_CARE_PROVIDER_SITE_OTHER): Payer: Medicare Other | Admitting: *Deleted

## 2017-03-15 DIAGNOSIS — I495 Sick sinus syndrome: Secondary | ICD-10-CM

## 2017-03-15 NOTE — Progress Notes (Signed)
Remote pacemaker transmission.   

## 2017-03-18 ENCOUNTER — Encounter: Payer: Self-pay | Admitting: Cardiology

## 2017-03-30 LAB — CUP PACEART REMOTE DEVICE CHECK
Brady Statistic RA Percent Paced: 5 %
Implantable Lead Implant Date: 20121214
Implantable Lead Serial Number: 29098858
Implantable Pulse Generator Implant Date: 20121214
Lead Channel Impedance Value: 830 Ohm
Lead Channel Pacing Threshold Amplitude: 1 V
Lead Channel Pacing Threshold Pulse Width: 0.4 ms
Lead Channel Pacing Threshold Pulse Width: 0.4 ms
Lead Channel Setting Sensing Sensitivity: 2.5 mV
MDC IDC LEAD IMPLANT DT: 20121214
MDC IDC LEAD LOCATION: 753859
MDC IDC LEAD LOCATION: 753860
MDC IDC LEAD SERIAL: 29066260
MDC IDC MSMT BATTERY REMAINING LONGEVITY: 54 mo
MDC IDC MSMT BATTERY REMAINING PERCENTAGE: 87 %
MDC IDC MSMT LEADCHNL RA IMPEDANCE VALUE: 535 Ohm
MDC IDC MSMT LEADCHNL RV PACING THRESHOLD AMPLITUDE: 0.6 V
MDC IDC SESS DTM: 20180703124300
MDC IDC SET LEADCHNL RA PACING AMPLITUDE: 2 V
MDC IDC SET LEADCHNL RV PACING AMPLITUDE: 1.1 V
MDC IDC SET LEADCHNL RV PACING PULSEWIDTH: 0.4 ms
MDC IDC STAT BRADY RV PERCENT PACED: 8 %
Pulse Gen Serial Number: 118262

## 2017-04-06 ENCOUNTER — Encounter: Payer: Self-pay | Admitting: Internal Medicine

## 2017-04-06 ENCOUNTER — Ambulatory Visit (INDEPENDENT_AMBULATORY_CARE_PROVIDER_SITE_OTHER)
Admission: RE | Admit: 2017-04-06 | Discharge: 2017-04-06 | Disposition: A | Discharge status: Home / Self Care | Payer: Medicare Other | Source: Ambulatory Visit | Attending: Internal Medicine | Admitting: Internal Medicine

## 2017-04-06 ENCOUNTER — Ambulatory Visit (INDEPENDENT_AMBULATORY_CARE_PROVIDER_SITE_OTHER): Payer: Medicare Other | Admitting: Internal Medicine

## 2017-04-06 ENCOUNTER — Other Ambulatory Visit (INDEPENDENT_AMBULATORY_CARE_PROVIDER_SITE_OTHER): Payer: Medicare Other

## 2017-04-06 VITALS — BP 110/74 | HR 62 | Ht 63.0 in | Wt 127.0 lb

## 2017-04-06 DIAGNOSIS — R21 Rash and other nonspecific skin eruption: Secondary | ICD-10-CM

## 2017-04-06 DIAGNOSIS — R739 Hyperglycemia, unspecified: Secondary | ICD-10-CM

## 2017-04-06 DIAGNOSIS — R634 Abnormal weight loss: Secondary | ICD-10-CM | POA: Diagnosis not present

## 2017-04-06 DIAGNOSIS — R7989 Other specified abnormal findings of blood chemistry: Secondary | ICD-10-CM

## 2017-04-06 DIAGNOSIS — R7303 Prediabetes: Secondary | ICD-10-CM | POA: Insufficient documentation

## 2017-04-06 LAB — URINALYSIS, ROUTINE W REFLEX MICROSCOPIC
BILIRUBIN URINE: NEGATIVE
HGB URINE DIPSTICK: NEGATIVE
KETONES UR: NEGATIVE
LEUKOCYTES UA: NEGATIVE
Nitrite: NEGATIVE
PH: 7 (ref 5.0–8.0)
RBC / HPF: NONE SEEN (ref 0–?)
Specific Gravity, Urine: 1.01 (ref 1.000–1.030)
Total Protein, Urine: NEGATIVE
UROBILINOGEN UA: 0.2 (ref 0.0–1.0)
Urine Glucose: NEGATIVE
WBC UA: NONE SEEN (ref 0–?)

## 2017-04-06 LAB — LIPID PANEL
CHOL/HDL RATIO: 4
Cholesterol: 124 mg/dL (ref 0–200)
HDL: 35.4 mg/dL — AB (ref >39.00)
NonHDL: 88.74
Triglycerides: 254 mg/dL — ABNORMAL HIGH (ref 0.0–149.0)
VLDL: 50.8 mg/dL — ABNORMAL HIGH (ref 0.0–40.0)

## 2017-04-06 LAB — CBC WITH DIFFERENTIAL/PLATELET
BASOS ABS: 0.1 10*3/uL (ref 0.0–0.1)
Basophils Relative: 1.4 % (ref 0.0–3.0)
EOS PCT: 9.3 % — AB (ref 0.0–5.0)
Eosinophils Absolute: 0.5 10*3/uL (ref 0.0–0.7)
HEMATOCRIT: 42.3 % (ref 39.0–52.0)
Hemoglobin: 14.2 g/dL (ref 13.0–17.0)
LYMPHS ABS: 1.4 10*3/uL (ref 0.7–4.0)
Lymphocytes Relative: 24.7 % (ref 12.0–46.0)
MCHC: 33.7 g/dL (ref 30.0–36.0)
MCV: 93.5 fl (ref 78.0–100.0)
MONOS PCT: 12.9 % — AB (ref 3.0–12.0)
Monocytes Absolute: 0.7 10*3/uL (ref 0.1–1.0)
NEUTROS ABS: 2.9 10*3/uL (ref 1.4–7.7)
NEUTROS PCT: 51.7 % (ref 43.0–77.0)
Platelets: 234 10*3/uL (ref 150.0–400.0)
RBC: 4.53 Mil/uL (ref 4.22–5.81)
RDW: 13.7 % (ref 11.5–15.5)
WBC: 5.6 10*3/uL (ref 4.0–10.5)

## 2017-04-06 LAB — HEPATIC FUNCTION PANEL
ALBUMIN: 4.3 g/dL (ref 3.5–5.2)
ALK PHOS: 61 U/L (ref 39–117)
ALT: 10 U/L (ref 0–53)
AST: 16 U/L (ref 0–37)
BILIRUBIN DIRECT: 0.1 mg/dL (ref 0.0–0.3)
BILIRUBIN TOTAL: 0.5 mg/dL (ref 0.2–1.2)
Total Protein: 7.3 g/dL (ref 6.0–8.3)

## 2017-04-06 LAB — BASIC METABOLIC PANEL
BUN: 11 mg/dL (ref 6–23)
CALCIUM: 9.4 mg/dL (ref 8.4–10.5)
CO2: 27 mEq/L (ref 19–32)
Chloride: 104 mEq/L (ref 96–112)
Creatinine, Ser: 0.96 mg/dL (ref 0.40–1.50)
GFR: 79.32 mL/min (ref >60.00)
GLUCOSE: 103 mg/dL — AB (ref 70–99)
Potassium: 3.8 mEq/L (ref 3.5–5.1)
SODIUM: 135 meq/L (ref 135–145)

## 2017-04-06 LAB — TSH: TSH: 5.4 u[IU]/mL — ABNORMAL HIGH (ref 0.35–4.50)

## 2017-04-06 LAB — LDL CHOLESTEROL, DIRECT: LDL DIRECT: 62 mg/dL

## 2017-04-06 LAB — HEMOGLOBIN A1C: HEMOGLOBIN A1C: 5.8 % (ref 4.6–6.5)

## 2017-04-06 MED ORDER — TRIAMCINOLONE ACETONIDE 0.1 % EX CREA: 1.0000 "application " | TOPICAL_CREAM | Freq: Two times a day (BID) | CUTANEOUS | 0 refills | Status: DC

## 2017-04-06 NOTE — Patient Instructions (Addendum)
Please take all new medication as prescribed - the steroid cream  Please continue all other medications as before, and refills have been done if requested.  Please have the pharmacy call with any other refills you may need.  Please continue your efforts at being more active, low cholesterol diet, and weight control.  You are otherwise up to date with prevention measures today.  Please keep your appointments with your specialists as you may have planned  Please go to the XRAY Department in the Basement (go straight as you get off the elevator) for the x-ray testing  Please go to the LAB in the Basement (turn left off the elevator) for the tests to be done today  You will be contacted by phone if any changes need to be made immediately.  Otherwise, you will receive a letter about your results with an explanation, but please check with MyChart first.  Please remember to sign up for MyChart if you have not done so, as this will be important to you in the future with finding out test results, communicating by private email, and scheduling acute appointments online when needed.
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
## 2017-04-09 NOTE — Assessment & Plan Note (Addendum)
Etiology unclear, exam benign, susepct geriatric decline, but for lab eval as requested per pt, for cxr as well

## 2017-04-09 NOTE — Assessment & Plan Note (Signed)
stable overall by history and exam, recent data reviewed with pt, and pt to continue medical treatment as before,  to f/u any worsening symptoms or concerns Lab Results  Component Value Date   HGBA1C 5.8 04/06/2017

## 2017-04-09 NOTE — Assessment & Plan Note (Signed)
C/w dermatitis, for steroid cream prn,  to f/u any worsening symptoms or concerns

## 2017-04-11 LAB — PROTEIN ELECTROPHORESIS, SERUM
ALBUMIN ELP: 4.2 g/dL (ref 3.8–4.8)
ALPHA-1-GLOBULIN: 0.3 g/dL (ref 0.2–0.3)
ALPHA-2-GLOBULIN: 0.8 g/dL (ref 0.5–0.9)
BETA GLOBULIN: 0.4 g/dL (ref 0.4–0.6)
Beta 2: 0.4 g/dL (ref 0.2–0.5)
Gamma Globulin: 1.1 g/dL (ref 0.8–1.7)
TOTAL PROTEIN, SERUM ELECTROPHOR: 7.3 g/dL (ref 6.1–8.1)

## 2017-04-22 ENCOUNTER — Other Ambulatory Visit: Payer: Self-pay | Admitting: Internal Medicine

## 2017-05-09 DIAGNOSIS — I609 Nontraumatic subarachnoid hemorrhage, unspecified: Secondary | ICD-10-CM | POA: Insufficient documentation

## 2017-05-09 DIAGNOSIS — I6200 Nontraumatic subdural hemorrhage, unspecified: Secondary | ICD-10-CM | POA: Insufficient documentation

## 2017-05-12 ENCOUNTER — Telehealth: Payer: Self-pay | Admitting: Internal Medicine

## 2017-05-12 DIAGNOSIS — I714 Abdominal aortic aneurysm, without rupture, unspecified: Secondary | ICD-10-CM

## 2017-05-12 NOTE — Telephone Encounter (Signed)
He is due for an ultrasound for follow up on his abdominal aortic aneurysm.  I have ordered this.

## 2017-05-13 NOTE — Telephone Encounter (Signed)
Spoke with pt to inform. Im unsure if patient understood what I was telling him. Pt did state that he was currently hospitalized. I do not see in the chart where. Just FYI

## 2017-05-17 ENCOUNTER — Encounter (HOSPITAL_COMMUNITY): Payer: Self-pay | Admitting: Internal Medicine

## 2017-05-31 ENCOUNTER — Ambulatory Visit (HOSPITAL_COMMUNITY): Payer: Medicare Other

## 2017-06-14 ENCOUNTER — Ambulatory Visit (INDEPENDENT_AMBULATORY_CARE_PROVIDER_SITE_OTHER): Payer: Medicare Other | Admitting: *Deleted

## 2017-06-14 DIAGNOSIS — I495 Sick sinus syndrome: Secondary | ICD-10-CM

## 2017-06-14 NOTE — Progress Notes (Signed)
Remote pacemaker transmission.   

## 2017-06-15 ENCOUNTER — Other Ambulatory Visit: Payer: Self-pay | Admitting: Interventional Cardiology

## 2017-06-15 ENCOUNTER — Encounter: Payer: Self-pay | Admitting: Cardiology

## 2017-06-17 LAB — CUP PACEART REMOTE DEVICE CHECK
Battery Remaining Longevity: 54 mo
Battery Remaining Percentage: 83 %
Brady Statistic RA Percent Paced: 5 %
Implantable Lead Implant Date: 20121214
Implantable Lead Implant Date: 20121214
Implantable Lead Location: 753859
Implantable Lead Model: 4135
Implantable Lead Model: 4136
Implantable Lead Serial Number: 29066260
Implantable Lead Serial Number: 29098858
Implantable Pulse Generator Implant Date: 20121214
Lead Channel Pacing Threshold Pulse Width: 0.4 ms
Lead Channel Pacing Threshold Pulse Width: 0.4 ms
Lead Channel Setting Pacing Amplitude: 1 V
Lead Channel Setting Pacing Amplitude: 2 V
Lead Channel Setting Pacing Pulse Width: 0.4 ms
MDC IDC LEAD LOCATION: 753860
MDC IDC MSMT LEADCHNL RA IMPEDANCE VALUE: 547 Ohm
MDC IDC MSMT LEADCHNL RA PACING THRESHOLD AMPLITUDE: 1 V
MDC IDC MSMT LEADCHNL RV IMPEDANCE VALUE: 800 Ohm
MDC IDC MSMT LEADCHNL RV PACING THRESHOLD AMPLITUDE: 0.6 V
MDC IDC SESS DTM: 20181002120800
MDC IDC SET LEADCHNL RV SENSING SENSITIVITY: 2.5 mV
MDC IDC STAT BRADY RV PERCENT PACED: 7 %
Pulse Gen Serial Number: 118262

## 2017-06-28 ENCOUNTER — Other Ambulatory Visit: Payer: Self-pay | Admitting: Internal Medicine

## 2017-07-01 ENCOUNTER — Encounter: Payer: Self-pay | Admitting: Cardiology

## 2017-07-03 ENCOUNTER — Other Ambulatory Visit: Payer: Self-pay | Admitting: Interventional Cardiology
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
## 2017-07-04 ENCOUNTER — Encounter: Payer: Self-pay | Admitting: Internal Medicine

## 2017-07-04 ENCOUNTER — Ambulatory Visit (INDEPENDENT_AMBULATORY_CARE_PROVIDER_SITE_OTHER): Payer: Medicare Other | Admitting: Internal Medicine

## 2017-07-04 VITALS — BP 140/78 | HR 64 | Temp 97.5°F | Resp 16 | Wt 126.0 lb

## 2017-07-04 DIAGNOSIS — I609 Nontraumatic subarachnoid hemorrhage, unspecified: Secondary | ICD-10-CM

## 2017-07-04 DIAGNOSIS — I6200 Nontraumatic subdural hemorrhage, unspecified: Secondary | ICD-10-CM

## 2017-07-04 MED ORDER — LEVETIRACETAM 500 MG PO TABS: 500.0000 mg | ORAL_TABLET | Freq: Two times a day (BID) | ORAL | 0 refills | Status: DC

## 2017-07-04 NOTE — Patient Instructions (Signed)
    No immunizations administered today.   Medications reviewed and updated.   No changes recommended at this time.  Your prescription(s) have been submitted to your pharmacy. Please take as directed and contact our office if you believe you are having problem(s) with the medication(s).   Please followup in 6 months

## 2017-07-04 NOTE — Assessment & Plan Note (Signed)
Related to fall and being on 2 different blood thinners Bleeding has improved and he is following with neurology and neurosurgery Blood thinners temporarily on hold Has follow-up imaging 2 days Continue Keppra-will address with neurosurgery in 2 days, but I did refill today Continue physical therapy, occupational therapy and speech therapy-he has seen no improvement in his weakness

## 2017-07-08 ENCOUNTER — Telehealth: Payer: Self-pay

## 2017-07-08 NOTE — Telephone Encounter (Signed)
Call returned to Pt.  Received verbal permission to speak with his next of kin.  Notified that Pt should no longer take Eliquis d/t brain bleeding after recent fall-verified with Dr. Lovena Le.  Family indicates understanding.

## 2017-07-08 NOTE — Telephone Encounter (Signed)
Pt care provider called stating that pt states is no longer suppose to be taking Eliquis 2.5 mg and was taking off of it per another provider. The care provider wanted to make sure pt should be taking it on not seeing as Dr. Lovena Le prescribed this medication. I looked in chart notes from 12/14/16 and states pt should continue same meds. There was no DPR on file for me to talk to care provider about this. Pt got on phone to ask what need to be done and I told patient they would need to called Dr. who took them off this medication and I would send a note to Dr. Tanna Furry nurse. Pt stated that they would call back. Please review and advise.

## 2017-07-22 IMAGING — DX DG CHEST 2V
3 series · 3 of 3 positions shown · non-contrast
Comparison: 11/26/2014

CLINICAL DATA: Dizziness and nausea beginning this morning.

EXAM:
CHEST  2 VIEW

[x chest ap]
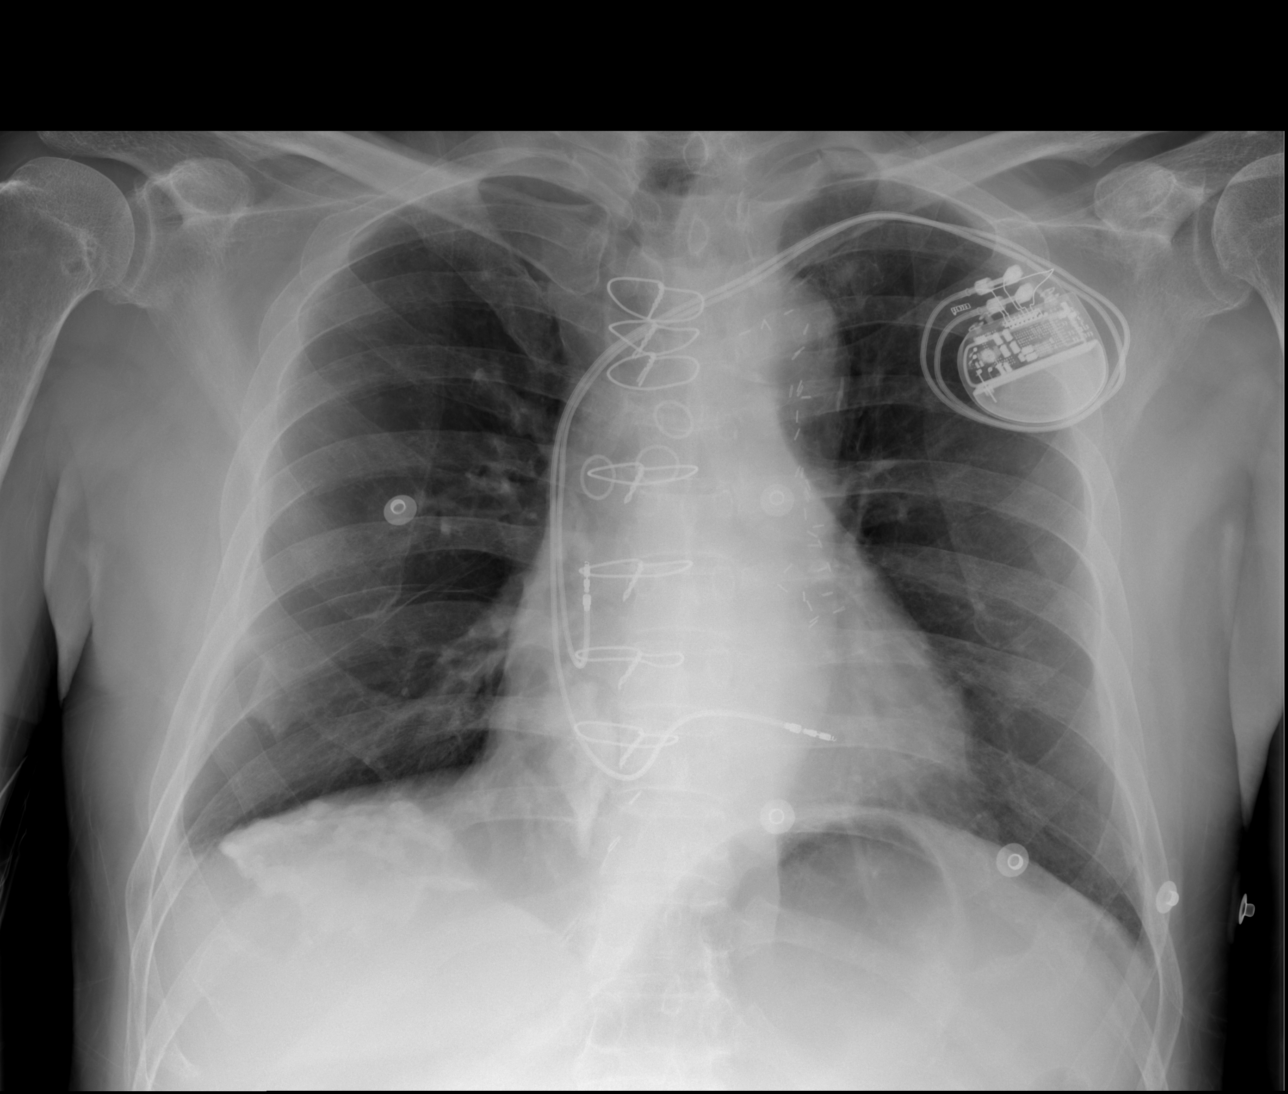

[w chest lat (1 of 2)]
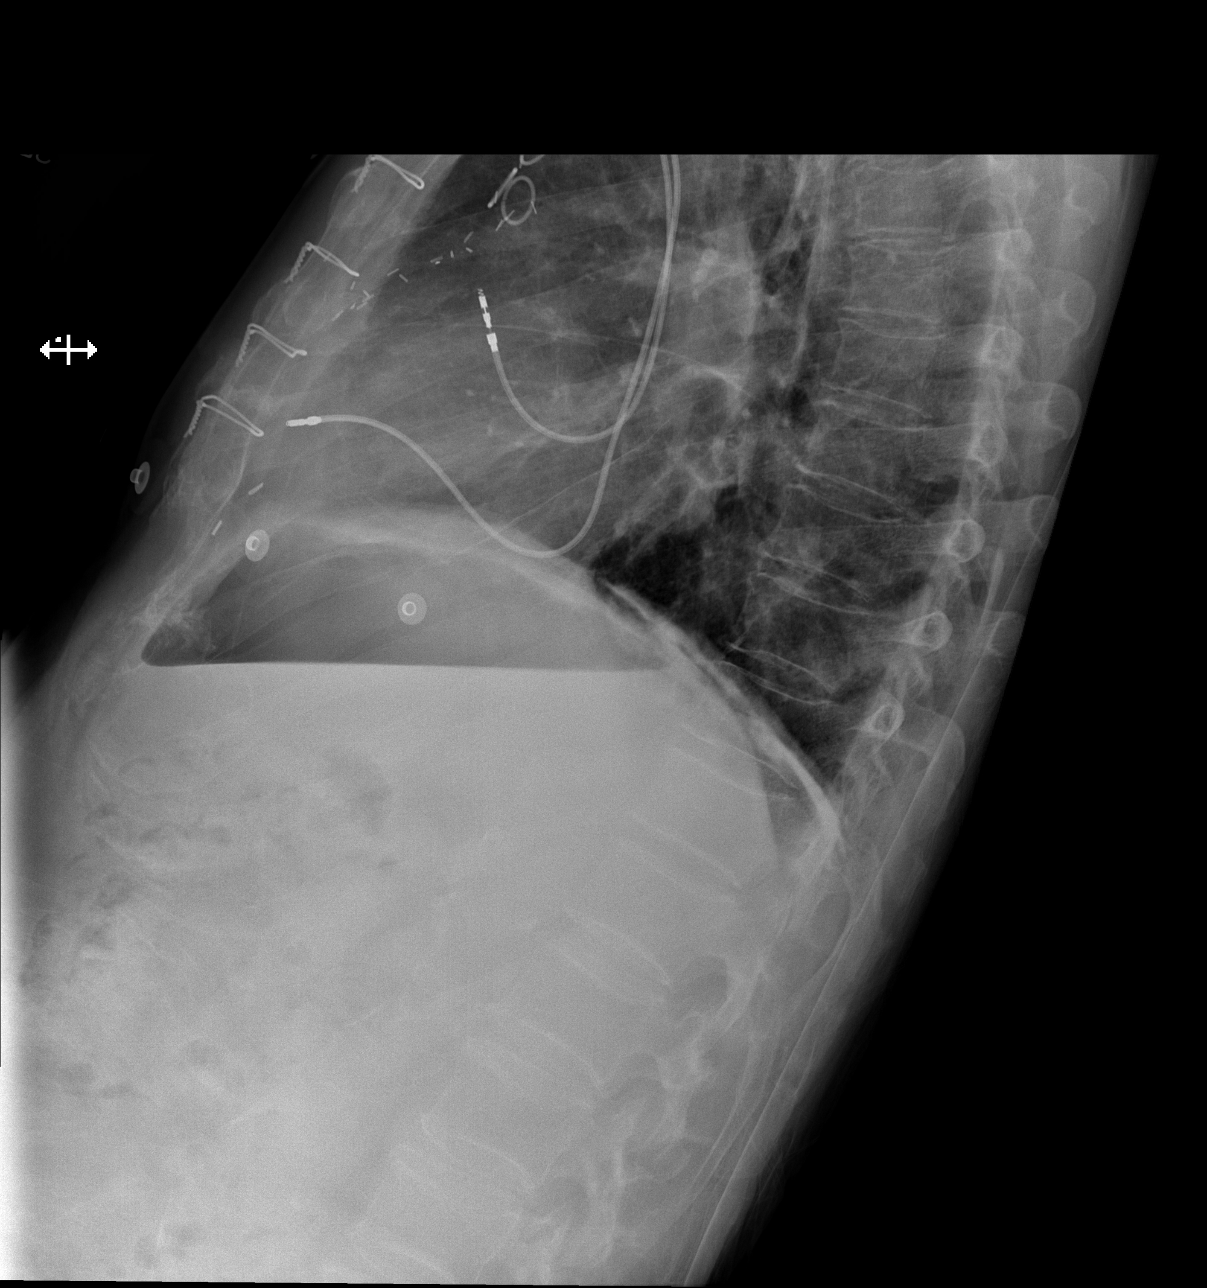

[w chest lat (2 of 2)]
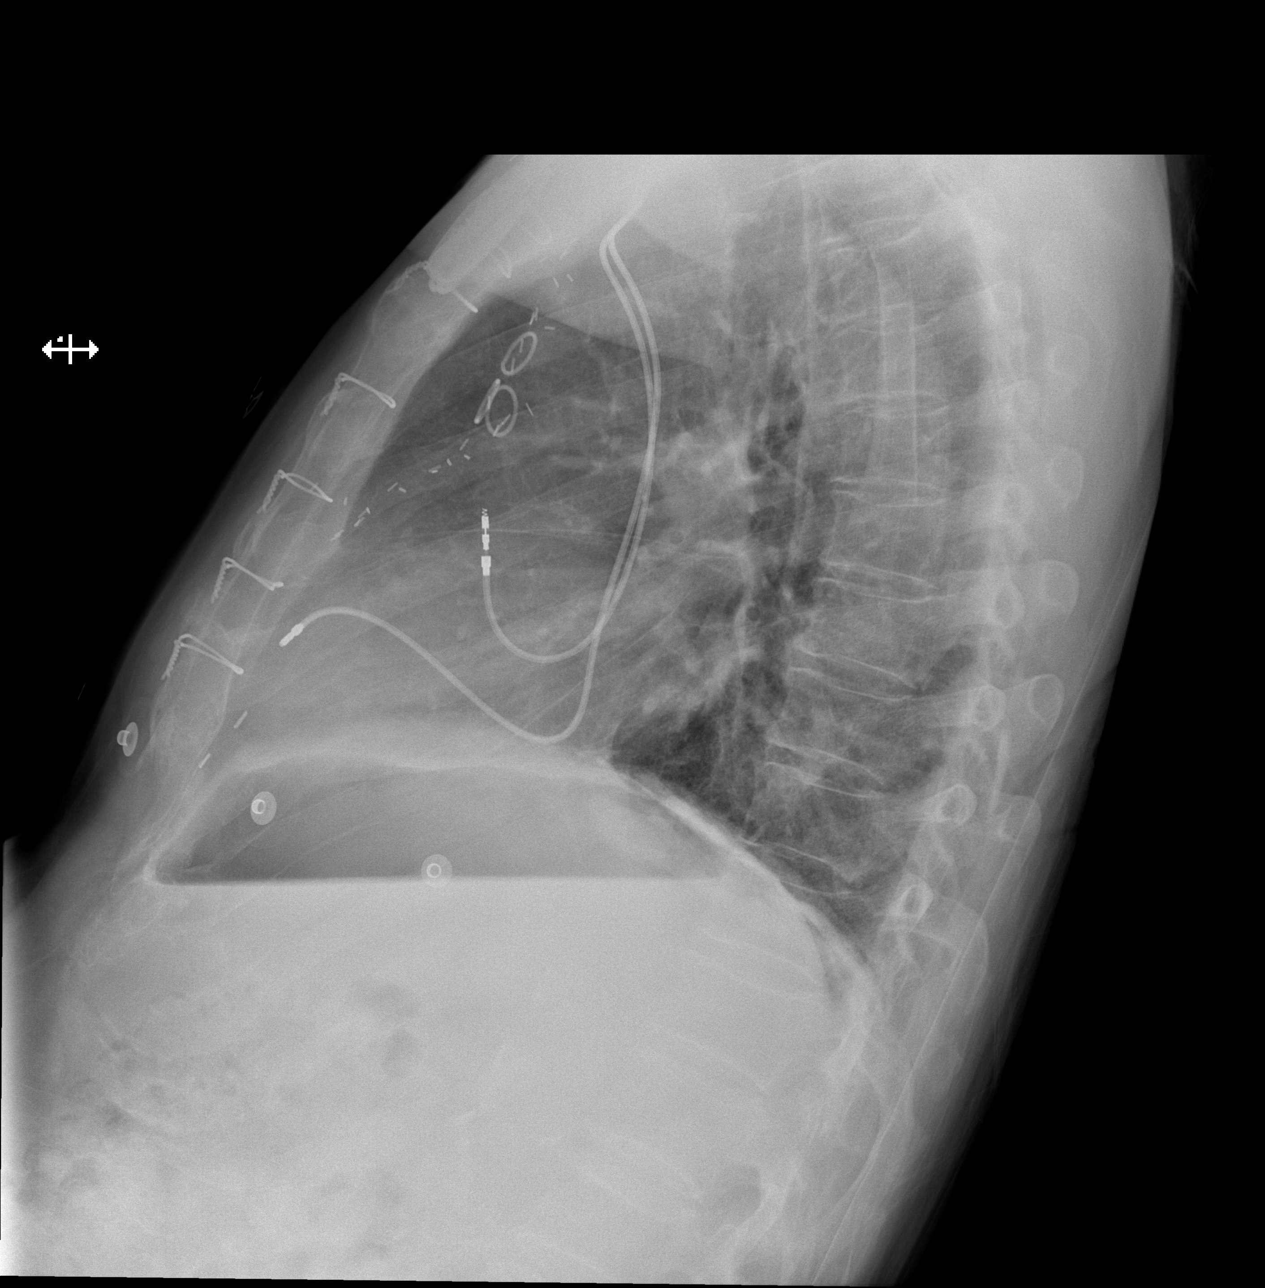

[3 of 3 positions shown; findings below may reference images not displayed]

FINDINGS: The lungs are clear wiithout focal pneumonia, edema, pneumothorax or
pleural effusion. There is a small nodule in the right apex which
was not present on the previous study or an exam from 06/26/2014.
Calcified pleural plaques noted bilaterally. The cardiopericardial
silhouette is within normal limits for size. Patient is status post
CABG. Left delete permanent pacemaker remains in place. The
visualized bony structures of the thorax are intact.
IMPRESSION: New tiny nodule in the right upper lobe (superimposed on anterior
first rib). CT chest without contrast recommended to further
evaluate.

Otherwise unremarkable.

## 2017-08-06 ENCOUNTER — Other Ambulatory Visit: Payer: Self-pay | Admitting: Internal Medicine

## 2017-08-23 ENCOUNTER — Ambulatory Visit: Payer: Medicare Other | Admitting: Internal Medicine

## 2017-09-14 ENCOUNTER — Ambulatory Visit (INDEPENDENT_AMBULATORY_CARE_PROVIDER_SITE_OTHER): Payer: Medicare Other | Admitting: *Deleted

## 2017-09-14 DIAGNOSIS — I495 Sick sinus syndrome: Secondary | ICD-10-CM

## 2017-09-15 NOTE — Progress Notes (Signed)
Remote pacemaker transmission.   

## 2017-09-16 ENCOUNTER — Encounter: Payer: Self-pay | Admitting: Cardiology

## 2017-09-17 ENCOUNTER — Other Ambulatory Visit: Payer: Self-pay

## 2017-09-17 ENCOUNTER — Emergency Department (HOSPITAL_COMMUNITY)
Admission: EM | Admit: 2017-09-17 | Discharge: 2017-09-17 | Disposition: A | Discharge status: Home / Self Care | Payer: Medicare Other | Attending: Emergency Medicine | Admitting: Emergency Medicine

## 2017-09-17 ENCOUNTER — Encounter (HOSPITAL_COMMUNITY): Payer: Self-pay | Admitting: Obstetrics and Gynecology

## 2017-09-17 ENCOUNTER — Emergency Department (HOSPITAL_COMMUNITY): Payer: Medicare Other

## 2017-09-17 DIAGNOSIS — R1032 Left lower quadrant pain: Secondary | ICD-10-CM | POA: Insufficient documentation

## 2017-09-17 DIAGNOSIS — Z5321 Procedure and treatment not carried out due to patient leaving prior to being seen by health care provider: Secondary | ICD-10-CM | POA: Insufficient documentation
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
## 2017-09-21 ENCOUNTER — Ambulatory Visit: Payer: Medicare Other | Admitting: Internal Medicine

## 2017-09-21 ENCOUNTER — Encounter: Payer: Self-pay | Admitting: Internal Medicine

## 2017-09-21 VITALS — BP 148/88 | HR 62 | Temp 97.5°F | Resp 16 | Wt 131.0 lb

## 2017-09-21 DIAGNOSIS — S060X0A Concussion without loss of consciousness, initial encounter: Secondary | ICD-10-CM | POA: Diagnosis not present

## 2017-09-21 DIAGNOSIS — G479 Sleep disorder, unspecified: Secondary | ICD-10-CM | POA: Diagnosis not present

## 2017-09-21 DIAGNOSIS — S134XXA Sprain of ligaments of cervical spine, initial encounter: Secondary | ICD-10-CM | POA: Diagnosis not present

## 2017-09-21 DIAGNOSIS — M25572 Pain in left ankle and joints of left foot: Secondary | ICD-10-CM

## 2017-09-21 MED ORDER — TRAZODONE HCL 50 MG PO TABS: 25.0000 mg | ORAL_TABLET | Freq: Every evening | ORAL | 3 refills | Status: DC | PRN

## 2017-09-21 NOTE — Patient Instructions (Signed)
   Medications reviewed and updated.  Changes include starting trazodone 25 mg - 50 mg at night for sleep.  Call if this is not helpful.  Continue tylenol and apply heat to your back and neck.   Your prescription(s) have been submitted to your pharmacy. Please take as directed and contact our office if you believe you are having problem(s) with the medication(s).   Please schedule an appointment with Dr Tamala Julian for your headaches and ankle pain
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
## 2017-09-21 NOTE — Assessment & Plan Note (Signed)
Neck and upper back pain - muscular in nature Deferred muscle relaxer Continue tylenol Apply heat

## 2017-09-22 ENCOUNTER — Ambulatory Visit (INDEPENDENT_AMBULATORY_CARE_PROVIDER_SITE_OTHER): Payer: Medicare Other | Admitting: Family Medicine

## 2017-09-22 ENCOUNTER — Ambulatory Visit (INDEPENDENT_AMBULATORY_CARE_PROVIDER_SITE_OTHER)
Admission: RE | Admit: 2017-09-22 | Discharge: 2017-09-22 | Disposition: A | Discharge status: Home / Self Care | Payer: Medicare Other | Source: Ambulatory Visit | Attending: Family Medicine | Admitting: Family Medicine

## 2017-09-22 ENCOUNTER — Encounter: Payer: Self-pay | Admitting: Family Medicine

## 2017-09-22 VITALS — BP 140/80 | HR 59 | Ht 64.0 in | Wt 133.0 lb

## 2017-09-22 DIAGNOSIS — G8929 Other chronic pain: Secondary | ICD-10-CM

## 2017-09-22 DIAGNOSIS — R51 Headache: Secondary | ICD-10-CM | POA: Diagnosis not present

## 2017-09-22 DIAGNOSIS — M25572 Pain in left ankle and joints of left foot: Secondary | ICD-10-CM

## 2017-09-22 MED ORDER — VITAMIN D (ERGOCALCIFEROL) 1.25 MG (50000 UNIT) PO CAPS: 50000.0000 [IU] | ORAL_CAPSULE | ORAL | 0 refills | Status: DC

## 2017-09-22 NOTE — Patient Instructions (Addendum)
Good to see you.  For the head lets watch but I do not think another scan is needed If worsening of the headache occurs PLEASE go to ER immediately.  Once weekly vitamin D for 12 weeks to help muscle strength and endurance.  Use the cane walking Xrays downstairs Wear brace daily  pennsaid pinkie amount topically 2 times daily as needed.   See me again in 2 weeks

## 2017-09-22 NOTE — Assessment & Plan Note (Signed)
Left ankle pain.  Pain over the navicular bone..  X-rays ordered today.  We did discuss the possibility of a Cam walker but secondary to weakness of the right leg I do not think that this would be a good idea.  We discussed icing regimen, home exercise topical anti-inflammatories.  Patient will follow up with me again in 2 weeks

## 2017-09-22 NOTE — Assessment & Plan Note (Signed)
Discussed with patient in great length.  We discussed the patient does have chronic headaches but with patient's history of a aneurysm this can be concerning.  We discussed the possibility of a CT angiogram percent MR angiogram.  We encourage patient to follow-up with his neurosurgeon as well secondary to his history.  Patient will consider this.  We discussed with patient I do not want him on any type of blood thinner any over-the-counter blood thinners that could be contributing.  We discussed coming back in in 2 weeks for further evaluation or worsening symptoms to seek medical attention immediately.

## 2017-09-26 ENCOUNTER — Other Ambulatory Visit: Payer: Self-pay | Admitting: Internal Medicine

## 2017-09-29 ENCOUNTER — Encounter: Payer: Self-pay | Admitting: Interventional Cardiology

## 2017-09-29 ENCOUNTER — Ambulatory Visit (INDEPENDENT_AMBULATORY_CARE_PROVIDER_SITE_OTHER): Payer: Medicare Other | Admitting: Interventional Cardiology

## 2017-09-29 VITALS — BP 114/68 | HR 58 | Ht 64.0 in | Wt 132.6 lb

## 2017-09-29 DIAGNOSIS — I480 Paroxysmal atrial fibrillation: Secondary | ICD-10-CM | POA: Diagnosis not present

## 2017-09-29 DIAGNOSIS — I714 Abdominal aortic aneurysm, without rupture, unspecified: Secondary | ICD-10-CM

## 2017-09-29 DIAGNOSIS — I495 Sick sinus syndrome: Secondary | ICD-10-CM

## 2017-09-29 DIAGNOSIS — I10 Essential (primary) hypertension: Secondary | ICD-10-CM | POA: Diagnosis not present

## 2017-09-29 DIAGNOSIS — I25810 Atherosclerosis of coronary artery bypass graft(s) without angina pectoris: Secondary | ICD-10-CM

## 2017-09-29 DIAGNOSIS — I671 Cerebral aneurysm, nonruptured: Secondary | ICD-10-CM

## 2017-09-29 LAB — CUP PACEART REMOTE DEVICE CHECK
Brady Statistic RV Percent Paced: 6 %
Implantable Lead Location: 753860
Implantable Lead Model: 4136
Implantable Lead Serial Number: 29066260
Implantable Lead Serial Number: 29098858
Implantable Pulse Generator Implant Date: 20121214
Lead Channel Impedance Value: 534 Ohm
Lead Channel Impedance Value: 819 Ohm
Lead Channel Pacing Threshold Amplitude: 0.6 V
Lead Channel Pacing Threshold Amplitude: 1 V
Lead Channel Pacing Threshold Pulse Width: 0.4 ms
Lead Channel Pacing Threshold Pulse Width: 0.4 ms
Lead Channel Setting Sensing Sensitivity: 2.5 mV
MDC IDC LEAD IMPLANT DT: 20121214
MDC IDC LEAD IMPLANT DT: 20121214
MDC IDC LEAD LOCATION: 753859
MDC IDC MSMT BATTERY REMAINING LONGEVITY: 54 mo
MDC IDC MSMT BATTERY REMAINING PERCENTAGE: 82 %
MDC IDC SESS DTM: 20190102133400
MDC IDC SET LEADCHNL RA PACING AMPLITUDE: 2 V
MDC IDC SET LEADCHNL RV PACING AMPLITUDE: 1.1 V
MDC IDC SET LEADCHNL RV PACING PULSEWIDTH: 0.4 ms
MDC IDC STAT BRADY RA PERCENT PACED: 5 %
Pulse Gen Serial Number: 118262

## 2017-09-29 NOTE — Addendum Note (Signed)
Addended by: Loren Racer on: 09/29/2017 03:46 PM   Modules accepted: Orders

## 2017-09-29 NOTE — Patient Instructions (Addendum)
Medication Instructions:  Your physician recommends that you continue on your current medications as directed. Please refer to the Current Medication list given to you today.   Labwork: None  Testing/Procedures: None  Follow-Up: Your physician recommends that you schedule a follow-up appointment in: 9 - 12 months with Dr. Tamala Julian.  Any Other Special Instructions Will Be Listed Below (If Applicable).     If you need a refill on your cardiac medications before your next appointment, please call your pharmacy.
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
## 2017-10-02 ENCOUNTER — Other Ambulatory Visit: Payer: Self-pay | Admitting: Interventional Cardiology

## 2017-10-10 ENCOUNTER — Other Ambulatory Visit: Payer: Self-pay | Admitting: Interventional Cardiology

## 2017-10-10 DIAGNOSIS — I495 Sick sinus syndrome: Secondary | ICD-10-CM
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
## 2017-10-13 ENCOUNTER — Ambulatory Visit (INDEPENDENT_AMBULATORY_CARE_PROVIDER_SITE_OTHER): Payer: Medicare Other | Admitting: Internal Medicine

## 2017-10-13 ENCOUNTER — Ambulatory Visit (INDEPENDENT_AMBULATORY_CARE_PROVIDER_SITE_OTHER)
Admission: RE | Admit: 2017-10-13 | Discharge: 2017-10-13 | Disposition: A | Discharge status: Home / Self Care | Payer: Medicare Other | Source: Ambulatory Visit | Attending: Internal Medicine | Admitting: Internal Medicine

## 2017-10-13 ENCOUNTER — Encounter: Payer: Self-pay | Admitting: Internal Medicine

## 2017-10-13 VITALS — BP 146/88 | HR 80 | Temp 97.7°F | Resp 16 | Wt 130.0 lb

## 2017-10-13 DIAGNOSIS — S5011XA Contusion of right forearm, initial encounter: Secondary | ICD-10-CM

## 2017-10-13 DIAGNOSIS — M25561 Pain in right knee: Secondary | ICD-10-CM | POA: Diagnosis not present

## 2017-10-13 DIAGNOSIS — R0781 Pleurodynia: Secondary | ICD-10-CM

## 2017-10-13 NOTE — Assessment & Plan Note (Signed)
Mild ecchymosis right forearm He is having minimal discomfort No involvement of the elbow joint No imaging needed Continue Tylenol Can apply heat

## 2017-10-13 NOTE — Assessment & Plan Note (Signed)
Secondary to trauma-fall at home There is some swelling of the joint and he has some mild abrasions Apply antibacterial ointment to the abrasions and keep covered X-ray today Advised him to call if there is no improvement in the knee pain and swelling-at that point I will refer to sports medicine

## 2017-10-13 NOTE — Assessment & Plan Note (Signed)
Will get an x-ray  to evaluate for rib contusion versus fracture and to rule out multiple rib fractures  pain management He feels his pain is controlled with Tylenol so I will not prescribe anything stronger Advised him to call if his pain is not controlled

## 2017-10-13 NOTE — Patient Instructions (Signed)
Have xrays done today of your knee and ribs.    Continue to take tylenol for the pain.  Do not exceed 4000 mg of the Tylenol in 24 hours.    Ice the knee and apply anti-bacterial ointment.    Call if no improvement   We will call you with the results.

## 2017-10-19 ENCOUNTER — Ambulatory Visit (HOSPITAL_COMMUNITY): Admission: RE | Admit: 2017-10-19 | Payer: Medicare Other | Source: Ambulatory Visit

## 2017-10-26 ENCOUNTER — Ambulatory Visit (INDEPENDENT_AMBULATORY_CARE_PROVIDER_SITE_OTHER): Payer: Medicare Other | Admitting: Internal Medicine

## 2017-10-26 ENCOUNTER — Encounter: Payer: Self-pay | Admitting: Internal Medicine

## 2017-10-26 VITALS — BP 130/82 | HR 57 | Ht 64.0 in | Wt 131.2 lb

## 2017-10-26 DIAGNOSIS — Z950 Presence of cardiac pacemaker: Secondary | ICD-10-CM

## 2017-10-26 DIAGNOSIS — I10 Essential (primary) hypertension: Secondary | ICD-10-CM

## 2017-10-26 DIAGNOSIS — I480 Paroxysmal atrial fibrillation: Secondary | ICD-10-CM

## 2017-10-26 DIAGNOSIS — I495 Sick sinus syndrome: Secondary | ICD-10-CM | POA: Diagnosis not present
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
## 2017-10-26 NOTE — Patient Instructions (Signed)
Medication Instructions:  Your physician recommends that you continue on your current medications as directed. Please refer to the Current Medication list given to you today.  Labwork: None ordered.  Testing/Procedures: None ordered.  Follow-Up: Your physician wants you to follow-up in: one year with Dr. Lovena Le.   You will receive a reminder letter in the mail two months in advance. If you don't receive a letter, please call our office to schedule the follow-up appointment.  Remote monitoring is used to monitor your ICD from home. This monitoring reduces the number of office visits required to check your device to one time per year. It allows [place] to keep an eye on the functioning of your device to ensure it is working properly. You are scheduled for a device check from home on 12/14/2017. You may send your transmission at any time that day. If you have a wireless device, the transmission will be sent automatically. After your physician reviews your transmission, you will receive a postcard with your next transmission date.  Any Other Special Instructions Will Be Listed Below (If Applicable).  If you need a refill on your cardiac medications before your next appointment, please call your pharmacy.

## 2017-10-28 LAB — CUP PACEART INCLINIC DEVICE CHECK
Implantable Lead Implant Date: 20121214
Implantable Lead Location: 753860
Implantable Lead Model: 4135
Implantable Lead Model: 4136
Implantable Lead Serial Number: 29066260
Implantable Lead Serial Number: 29098858
Lead Channel Impedance Value: 849 Ohm
Lead Channel Pacing Threshold Amplitude: 0.5 V
Lead Channel Pacing Threshold Amplitude: 1 V
Lead Channel Sensing Intrinsic Amplitude: 25 mV
Lead Channel Sensing Intrinsic Amplitude: 3.5 mV
Lead Channel Setting Pacing Amplitude: 1 V
Lead Channel Setting Pacing Amplitude: 2 V
Lead Channel Setting Pacing Pulse Width: 0.4 ms
Lead Channel Setting Sensing Sensitivity: 2.5 mV
MDC IDC LEAD IMPLANT DT: 20121214
MDC IDC LEAD LOCATION: 753859
MDC IDC MSMT LEADCHNL RA IMPEDANCE VALUE: 570 Ohm
MDC IDC MSMT LEADCHNL RA PACING THRESHOLD PULSEWIDTH: 0.4 ms
MDC IDC MSMT LEADCHNL RV PACING THRESHOLD PULSEWIDTH: 0.4 ms
MDC IDC PG IMPLANT DT: 20121214
MDC IDC PG SERIAL: 118262
MDC IDC SESS DTM: 20190213050000

## 2017-10-31 ENCOUNTER — Ambulatory Visit (HOSPITAL_COMMUNITY)
Admission: RE | Admit: 2017-10-31 | Payer: Medicare Other | Source: Ambulatory Visit | Attending: Interventional Cardiology | Admitting: Interventional Cardiology

## 2017-11-04 ENCOUNTER — Ambulatory Visit (HOSPITAL_COMMUNITY)
Admission: RE | Admit: 2017-11-04 | Payer: Medicare Other | Source: Ambulatory Visit | Attending: Interventional Cardiology | Admitting: Interventional Cardiology

## 2017-11-17 ENCOUNTER — Telehealth: Payer: Self-pay | Admitting: Internal Medicine

## 2017-11-17 NOTE — Telephone Encounter (Signed)
Spoke with Liji to give verbal orders for PT per MD

## 2017-11-17 NOTE — Telephone Encounter (Signed)
Copied from [place] [id number]. Topic: Quick Communication - See Telephone Encounter >> Nov 17, 2017  8:58 AM Ahmed Prima L wrote: CRM for notification. See Telephone encounter for:   11/17/17.  Liji physical therapist [facility] needs verbal orders for Home health PT for 2 times for 4 weeks & once for one week after [phone]  Also when you call her back she wants to talk to the nurse about a medication that interacts with Simvastatin.

## 2017-11-25 ENCOUNTER — Telehealth: Payer: Self-pay | Admitting: Internal Medicine

## 2017-11-25 NOTE — Telephone Encounter (Signed)
noted 

## 2017-11-25 NOTE — Telephone Encounter (Signed)
Noted  

## 2017-11-25 NOTE — Telephone Encounter (Signed)
Copied from [place]. Topic: Quick Communication - See Telephone Encounter >> Nov 25, 2017  9:45 AM Robina Ade, Helene Kelp D wrote: CRM for notification. See Telephone encounter for: 11/25/17. Liji from [facility] called and said that they went last week to patients home for a evaluation of PT but patient was not home and when she spoke with patient he refuse PT therefore they will be discharging patient.

## 2017-12-12 DIAGNOSIS — G451 Carotid artery syndrome (hemispheric): Secondary | ICD-10-CM | POA: Diagnosis not present

## 2017-12-12 DIAGNOSIS — Z7982 Long term (current) use of aspirin: Secondary | ICD-10-CM | POA: Diagnosis not present

## 2017-12-12 DIAGNOSIS — G629 Polyneuropathy, unspecified: Secondary | ICD-10-CM | POA: Diagnosis not present

## 2017-12-12 DIAGNOSIS — Z951 Presence of aortocoronary bypass graft: Secondary | ICD-10-CM | POA: Diagnosis not present

## 2017-12-12 DIAGNOSIS — I10 Essential (primary) hypertension: Secondary | ICD-10-CM | POA: Diagnosis not present

## 2017-12-12 DIAGNOSIS — I69398 Other sequelae of cerebral infarction: Secondary | ICD-10-CM

## 2017-12-12 DIAGNOSIS — I4891 Unspecified atrial fibrillation: Secondary | ICD-10-CM | POA: Diagnosis not present

## 2017-12-12 DIAGNOSIS — M6281 Muscle weakness (generalized): Secondary | ICD-10-CM | POA: Diagnosis not present

## 2017-12-12 DIAGNOSIS — M1990 Unspecified osteoarthritis, unspecified site: Secondary | ICD-10-CM | POA: Diagnosis not present

## 2017-12-12 DIAGNOSIS — Z950 Presence of cardiac pacemaker: Secondary | ICD-10-CM | POA: Diagnosis not present

## 2017-12-12 DIAGNOSIS — F17211 Nicotine dependence, cigarettes, in remission: Secondary | ICD-10-CM | POA: Diagnosis not present

## 2017-12-12 DIAGNOSIS — I2510 Atherosclerotic heart disease of native coronary artery without angina pectoris: Secondary | ICD-10-CM | POA: Diagnosis not present

## 2017-12-14 ENCOUNTER — Encounter (INDEPENDENT_AMBULATORY_CARE_PROVIDER_SITE_OTHER): Payer: Self-pay

## 2017-12-14 ENCOUNTER — Ambulatory Visit (INDEPENDENT_AMBULATORY_CARE_PROVIDER_SITE_OTHER): Payer: Medicare Other | Admitting: *Deleted

## 2017-12-14 DIAGNOSIS — I495 Sick sinus syndrome: Secondary | ICD-10-CM | POA: Diagnosis not present

## 2017-12-14 NOTE — Progress Notes (Signed)
Remote pacemaker transmission.   

## 2017-12-15 ENCOUNTER — Encounter: Payer: Self-pay | Admitting: Cardiology

## 2017-12-19 ENCOUNTER — Telehealth: Payer: Self-pay | Admitting: Behavioral Health

## 2017-12-19 ENCOUNTER — Ambulatory Visit: Payer: Medicare Other | Admitting: Family Medicine

## 2017-12-19 ENCOUNTER — Encounter: Payer: Self-pay | Admitting: Family Medicine

## 2017-12-19 VITALS — BP 118/70 | HR 71 | Ht 64.0 in | Wt 127.0 lb

## 2017-12-19 DIAGNOSIS — G5621 Lesion of ulnar nerve, right upper limb: Secondary | ICD-10-CM | POA: Diagnosis not present

## 2017-12-19 DIAGNOSIS — F419 Anxiety disorder, unspecified: Secondary | ICD-10-CM | POA: Diagnosis not present

## 2017-12-19 MED ORDER — PAROXETINE HCL 10 MG PO TABS: 10.0000 mg | ORAL_TABLET | Freq: Every day | ORAL | 0 refills | Status: DC

## 2017-12-19 NOTE — Telephone Encounter (Signed)
Called to follow-up with patient regarding the symptoms listed in the Appointment Info note, 12/19/17; the note stated, "patient has had an aneurysm in the past; headache, etc. Per the patient's daughter, whom was present on the call, reported that her father has been excessively stressed here recently & very nervous. Also, she informed RN that he's been complaining of elbow pain over the last few days. Patient rates his pain 6/10 on a 0-10 pain scale & describes it as a dull, constant pain; no injury to the area. He denies chest pain, dizziness and numbness in limbs (other than norm post aneurysm). Per the daughter, her father still has a headache though. An appointment has been previously made for today, April 8th at 3:00 PM with Dr. Ethelene Hal. Prior to call ending, there were no further questions or concerns.

## 2017-12-19 NOTE — Patient Instructions (Signed)

## 2017-12-19 NOTE — Progress Notes (Addendum)
Subjective:  Patient ID: Shaun James, male    DOB: [birth year]  Age: 84 y.o. MRN: [id number]  CC: Anxiety and Headache   HPI MAYJOR AGER presents for anxiety.  She says this is been going on for some time now it is never been treated.  Initially denies any type of depression.  He says that he and his wife often argue about her 24 year old daughter who seems to be causing a lot of trouble with the family.  He denies any type of serious progressive headache.  He does have headaches associated with his anxiety and they are probably relieved with Tylenol.  Is been reassured by recent checkup on his cardiac pacemaker.  He tells of an ongoing 2-year history of numbness in the last 2 fingers of his right hand.  There was no injury.  He tells that sometimes the numbness moves up towards his elbow but never extends beyond the elbow.  Outpatient Medications Prior to Visit  Medication Sig Dispense Refill  . acetaminophen (TYLENOL) 500 MG tablet Take 1 tablet (500 mg total) by mouth daily as needed for headache. 14 tablet 0  . CARTIA XT 120 MG 24 hr capsule TAKE 1 CAPSULE DAILY 90 capsule 3  . nitroGLYCERIN (NITROSTAT) 0.4 MG SL tablet Place 0.4 mg under the tongue every 5 (five) minutes as needed for chest pain.    . ranolazine (RANEXA) 500 MG 12 hr tablet Take 1 tablet (500 mg total) by mouth 2 (two) times daily. 180 tablet 3  . simvastatin (ZOCOR) 20 MG tablet TAKE 1 TABLET EVERY EVENING 90 tablet 1  . tamsulosin (FLOMAX) 0.4 MG CAPS capsule TAKE 1 CAPSULE DAILY 90 capsule 2  . levETIRAcetam (KEPPRA) 500 MG tablet TAKE 1 TABLET BY MOUTH TWICE A DAY 60 tablet 5  . traZODone (DESYREL) 50 MG tablet Take 0.5-1 tablets (25-50 mg total) by mouth at bedtime as needed for sleep. 30 tablet 3  . triamcinolone cream (KENALOG) 0.1 % Apply 1 application topically 2 (two) times daily. 30 g 0  . Vitamin D, Ergocalciferol, (DRISDOL) 50000 units CAPS capsule Take 1 capsule (50,000 Units total) by mouth every 7 (seven)  days. 12 capsule 0   No facility-administered medications prior to visit.     ROS Review of Systems  Constitutional: Negative for chills, fatigue and fever.  HENT: Negative.   Eyes: Negative.  Negative for photophobia and visual disturbance.  Respiratory: Negative.   Cardiovascular: Negative.   Gastrointestinal: Negative.   Skin: Negative for color change and rash.  Allergic/Immunologic: Negative for immunocompromised state.  Neurological: Positive for headaches. Negative for dizziness, facial asymmetry, speech difficulty and weakness.  Psychiatric/Behavioral: Negative for self-injury and suicidal ideas. The patient is nervous/anxious.     Objective:  BP 118/70 (BP Location: Left Arm, Patient Position: Sitting, Cuff Size: Normal)   Pulse 71   Ht 5\' 4"  (1.626 m)   Wt 127 lb (57.6 kg)   SpO2 97%   BMI 21.80 kg/m   BP Readings from Last 3 Encounters:  12/19/17 118/70  10/26/17 130/82  10/13/17 (!) 146/88    Wt Readings from Last 3 Encounters:  12/19/17 127 lb (57.6 kg)  10/26/17 131 lb 3.2 oz (59.5 kg)  10/13/17 130 lb (59 kg)    Physical Exam  Constitutional: He is oriented to person, place, and time. He appears well-developed and well-nourished.  Non-toxic appearance. He does not appear ill. No distress.  HENT:  Head: Normocephalic and atraumatic.  Eyes: Pupils are  equal, round, and reactive to light. EOM are normal. No scleral icterus. Right eye exhibits normal extraocular motion and no nystagmus. Left eye exhibits normal extraocular motion and no nystagmus. Right pupil is round and reactive. Left pupil is round and reactive. Pupils are equal.  Neck: Neck supple. No JVD present. No neck rigidity.  Cardiovascular: Normal rate, regular rhythm and normal heart sounds.  Pulmonary/Chest: Effort normal and breath sounds normal.  Abdominal: Bowel sounds are normal.  Lymphadenopathy:    He has no cervical adenopathy.  Neurological: He is alert and oriented to person, place,  and time. He has normal strength. No cranial nerve deficit.  Positive Tinel sign of the right ulnar groove.   Psychiatric: He has a normal mood and affect. His behavior is normal. His mood appears not anxious. He is not agitated.   Depression screen [facility] 2/9 12/19/2017 10/13/2017 05/05/2016  Decreased Interest 2 0 0  Down, Depressed, Hopeless 0 0 0  PHQ - 2 Score 2 0 0  Altered sleeping 2 - -  Tired, decreased energy 1 - -  Change in appetite 0 - -  Feeling bad or failure about yourself  0 - -  Trouble concentrating 2 - -  Moving slowly or fidgety/restless 2 - -  Suicidal thoughts 0 - -  PHQ-9 Score 9 - -  Some recent data might be hidden    Lab Results  Component Value Date   WBC 5.6 04/06/2017   HGB 14.2 04/06/2017   HCT 42.3 04/06/2017   PLT 234.0 04/06/2017   GLUCOSE 103 (H) 04/06/2017   CHOL 124 04/06/2017   TRIG 254.0 (H) 04/06/2017   HDL 35.40 (L) 04/06/2017   LDLDIRECT 62.0 04/06/2017   LDLCALC 47 08/19/2016   ALT 10 04/06/2017   AST 16 04/06/2017   NA 135 04/06/2017   K 3.8 04/06/2017   CL 104 04/06/2017   CREATININE 0.96 04/06/2017   BUN 11 04/06/2017   CO2 27 04/06/2017   TSH 5.40 (H) 04/06/2017   PSA 3.40 08/30/2013   INR 1.21 08/18/2016   HGBA1C 5.8 04/06/2017    Dg Ribs Unilateral Right  Result Date: 10/13/2017 CLINICAL DATA:  Fall 2 days ago with right lateral lower rib pain. EXAM: RIGHT RIBS - 2 VIEW COMPARISON:  Chest x-ray 09/17/2017 FINDINGS: Examination demonstrates a minimally displaced fracture of the lateral right tenth rib. Right lung is clear. Calcified pleural plaques of the right lung base. Mild degenerate change of the spine. Calcified plaque over the abdominal aorta. IMPRESSION: Minimally displaced right lateral tenth rib fracture. Electronically Signed   By: Marin Olp M.D.   On: 10/13/2017 16:02   Dg Knee Complete 4 Views Right  Result Date: 10/13/2017 CLINICAL DATA:  Fall.  Anterior pain and swelling. EXAM: RIGHT KNEE - COMPLETE 4+ VIEW  COMPARISON:  None. FINDINGS: No fracture. No subluxation or dislocation. No substantial joint effusion. Surgical clips are noted in the medial soft tissues. IMPRESSION: Negative. Electronically Signed   By: Misty Stanley M.D.   On: 10/13/2017 15:58    Assessment & Plan:   Criag was seen today for anxiety and headache.  Diagnoses and all orders for this visit:  Ulnar neuropathy of right upper extremity  Anxiety -     PARoxetine (PAXIL) 10 MG tablet; Take 1 tablet (10 mg total) by mouth daily.   I have discontinued Percival Spanish. Tristan's triamcinolone cream, levETIRAcetam, traZODone, and Vitamin D (Ergocalciferol). I am also having him start on PARoxetine. Additionally, I am  having him maintain his acetaminophen, nitroGLYCERIN, tamsulosin, simvastatin, ranolazine, and CARTIA XT.  Meds ordered this encounter  Medications  . PARoxetine (PAXIL) 10 MG tablet    Sig: Take 1 tablet (10 mg total) by mouth daily.    Dispense:  30 tablet    Refill:  0   Will start patient on low-dose Paxil and move him up slowly if at all.  We discussed that if it makes him drowsy he is to take it at night.  If he takes it at night and has trouble rising the next morning he may try taking it first thing in the morning.  He has a long-standing ulnar neuropathy and involving his right elbow.  He will be following up with neurology next month and suggested that he see them for possible nerve conduction studies.  Follow-up: Return in about 1 month (around 01/18/2018).  Libby Maw, MD

## 2018-01-02 ENCOUNTER — Ambulatory Visit: Payer: Medicare Other | Admitting: Internal Medicine

## 2018-01-03 LAB — CUP PACEART REMOTE DEVICE CHECK
Brady Statistic RA Percent Paced: 6 %
Brady Statistic RV Percent Paced: 6 %
Implantable Lead Implant Date: 20121214
Implantable Lead Location: 753860
Implantable Lead Model: 4136
Lead Channel Impedance Value: 565 Ohm
Lead Channel Impedance Value: 800 Ohm
Lead Channel Pacing Threshold Amplitude: 0.6 V
Lead Channel Pacing Threshold Amplitude: 1 V
Lead Channel Pacing Threshold Pulse Width: 0.4 ms
Lead Channel Setting Pacing Amplitude: 1.1 V
Lead Channel Setting Sensing Sensitivity: 2.5 mV
MDC IDC LEAD IMPLANT DT: 20121214
MDC IDC LEAD LOCATION: 753859
MDC IDC LEAD SERIAL: 29066260
MDC IDC LEAD SERIAL: 29098858
MDC IDC MSMT BATTERY REMAINING LONGEVITY: 48 mo
MDC IDC MSMT BATTERY REMAINING PERCENTAGE: 74 %
MDC IDC MSMT LEADCHNL RA PACING THRESHOLD PULSEWIDTH: 0.4 ms
MDC IDC PG IMPLANT DT: 20121214
MDC IDC PG SERIAL: 118262
MDC IDC SESS DTM: 20190403123200
MDC IDC SET LEADCHNL RA PACING AMPLITUDE: 2 V
MDC IDC SET LEADCHNL RV PACING PULSEWIDTH: 0.4 ms

## 2018-01-07 NOTE — Progress Notes (Signed)
Subjective:    Patient ID: Shaun James, male    DOB: 06-Jun-1931, 84 y.o.   MRN: [id number]  HPI The patient is here for follow up.  CAD, Hypertension: He is taking his medication daily. He is compliant with a low sodium diet.  He denies chest pain, palpitations, edema, shortness of breath and regular headaches. He is exercising regularly - walking on treadmill and outside.     Hyperlipidemia: He is taking his medication daily. He is compliant with a low fat/cholesterol diet. He is exercising regularly. He denies myalgias.   Prediabetes:  He is compliant with a low sugar/carbohydrate diet.  He is exercising regularly.  BPH:  He is taking flomax daily.  Recently he has noticed a change in urination.  He is taking the Flomax daily.  He is not following with urology, but did see them years ago.  Recently he has fallen a couple of times.  He is also noticed in the past couple of weeks that he has been unable to control his urine.  He did have a recent CT scan done by neurosurgery because of his history of an aneurysm.  He does have ventriculomegaly on exam.  He has seen neurology and has an appoint with them in a couple of months.  He does state poor memory, but this is not new and he does not feel that this has worsened significantly recently.  He is not able to control his urine and as stated above this occurred a couple of weeks ago.  He has been needing to wear diapers because of this.  He denies any painful urination or blood in the urine.  He is unsure if he is emptying his bladder completely.  He denies any abdominal pain.  Medications and allergies reviewed with patient and updated if appropriate.  Patient Active Problem List   Diagnosis Date Noted  . Ulnar neuropathy of right upper extremity 12/19/2017  . Anxiety 12/19/2017  . Acute pain of right knee 10/13/2017  . Rib pain on right side 10/13/2017  . Sleep difficulties 09/21/2017  . Concussion with no loss of consciousness  09/21/2017  . Acute left ankle pain 09/21/2017  . Subdural hemorrhage ([place]) 05/09/2017  . Subarachnoid hemorrhage ([place]) 05/09/2017  . Prediabetes 04/06/2017  . Weight loss 04/06/2017  . Cervical radiculopathy at C8 10/08/2016  . Paroxysmal atrial fibrillation (HCC)   . Expressive aphasia 08/18/2016  . Essential hypertension, benign 08/17/2016  . Numbness and tingling of right arm 08/17/2016  . AAA (abdominal aortic aneurysm) without rupture ([place]) 04/25/2016  . Pulmonary nodule 04/25/2016  . Bilateral hearing loss 03/22/2016  . Aneurysm, cerebral, nonruptured 03/11/2016  . Carotid artery stenosis 03/11/2016  . BPPV (benign paroxysmal positional vertigo) 03/10/2016  . Ataxia   . Burning tongue 08/12/2015  . GERD (gastroesophageal reflux disease) 08/12/2015  . BPH without urinary obstruction 04/10/2015  . 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 Chronic headache   . Osteoarthritis   . Cardiac pacemaker in situ 10/18/2011  . CAD (coronary artery disease), autologous vein bypass graft 09/13/2011  . Sick sinus syndrome ([place]) 09/13/2011  . Hyperlipidemia with target LDL less than 70 04/16/2008  . Acute myocardial infarction ([place]) 04/16/2008  . Pulmonary fibrosis ([place]) 04/16/2008    Current Outpatient Medications on File Prior to Visit  Medication Sig Dispense Refill  . acetaminophen (TYLENOL) 500 MG tablet Take 1 tablet (500 mg total) by mouth daily as needed for headache. 14 tablet 0  . CARTIA  XT 120 MG 24 hr capsule TAKE 1 CAPSULE DAILY 90 capsule 3  . nitroGLYCERIN (NITROSTAT) 0.4 MG SL tablet Place 0.4 mg under the tongue every 5 (five) minutes as needed for chest pain.    Marland Kitchen PARoxetine (PAXIL) 10 MG tablet Take 1 tablet (10 mg total) by mouth daily. 30 tablet 0  . ranolazine (RANEXA) 500 MG 12 hr tablet Take 1 tablet (500 mg total) by mouth 2 (two) times daily. 180 tablet 3  . simvastatin (ZOCOR) 20 MG tablet TAKE 1 TABLET EVERY EVENING 90 tablet 1  . tamsulosin (FLOMAX) 0.4  MG CAPS capsule TAKE 1 CAPSULE DAILY 90 capsule 2   No current facility-administered medications on file prior to visit.     Past Medical History:  Diagnosis Date  . AAA (abdominal aortic aneurysm) (HCC)    3.1 cm AAA, reevaluated 08/2009 per ultrasound - stable  . Allergic rhinitis   . Aortic sclerosis    Probable AS on physical exam, 2010  . Aortic stenosis    mild AS 2014 echo  . Arthritis   . Atrial fibrillation (HCC)    chronic anticoag  . Atrial flutter ([place])   . Basal cell carcinoma   . Benign positional vertigo   . BPH (benign prostatic hyperplasia)   . CAD in native artery    a) s/p CABG '98; LIMA-LAD, SVG-D1, SVG-OM1, SVG-PDA); b) CATH -2009: midLAD & RCA 100%, OM2 100% wtih severe native Cx; Grafts patent with ~30-50% SVG-D1 & ~30-40% SVG-OM, EF 45%; c) Lexiscan Cardiolite 02/2014: EF 61%, No Ischemia; subtle fixed anteroseptal defect.  . Cardiac pacemaker in [place]  . Coronary atherosclerosis of native coronary artery   . Diverticulosis of colon (without mention of hemorrhage)   . DJD (degenerative joint disease)   . Esophageal reflux   . Essential hypertension, benign   . GERD (gastroesophageal reflux disease)   . Headache    after brain aneurysm  . History of tuberculosis    remote Hx  . Inflamed seborrheic keratosis   . Intermittent confusion   . Internal hemorrhoids    severe  . Mixed hyperlipidemia   . Mouth burn   . Myocardial infarction ([place])   . Near syncope    uncertain cause. R/O arrhythmia, R/O med effect  . Osteoarthritis 06/14/2012  . Peptic ulcer, unspecified site, unspecified as acute or chronic, without mention of hemorrhage, perforation, or obstruction   . Personal history of colonic polyps   . Pneumonia   . Presbycusis of both ears    Bilateral hearing aids  . Presence of permanent cardiac pacemaker   . Prostatitis dx 12/2102   E coli Ucx  . SSS (sick sinus syndrome) (HCC)    syncope, s/p ppm 12/12  . Systolic murmur     Worrisome for AS. AS could cause exertional fatigue.    Past Surgical History:  Procedure Laterality Date  . CARDIAC CATHETERIZATION  2002  . cataracts     bilateral  . COLONOSCOPY    . CORONARY ARTERY BYPASS GRAFT  1997   (LIMA to LAD, SVG to diagonal-50% closed on catheterization in 1999, SVG to OM1, SVG to PDA) Repeat cath 2009 with patent grafts  . EXCISION OF TONGUE LESION    . EYE SURGERY Bilateral    cataract surgery with lens implant  . HEMORRHOID SURGERY  07/31/2010  . INSERT / REPLACE / REMOVE PACEMAKER  08/27/2011   PPM implant  . IR GENERIC HISTORICAL  04/06/2016  IR ANGIO VERTEBRAL SEL SUBCLAVIAN INNOMINATE UNI L MOD SED 04/06/2016 Luanne Bras, MD [facility]  . IR GENERIC HISTORICAL  04/06/2016   IR ANGIO VERTEBRAL SEL VERTEBRAL UNI R MOD SED 04/06/2016 Luanne Bras, MD [facility]  . IR GENERIC HISTORICAL  04/06/2016   IR ANGIO INTRA EXTRACRAN SEL COM CAROTID INNOMINATE BILAT MOD SED 04/06/2016 Luanne Bras, MD [facility]  . IR GENERIC HISTORICAL  05/19/2016   IR ANGIO INTRA EXTRACRAN SEL INTERNAL CAROTID UNI L MOD SED 05/19/2016 Luanne Bras, MD [facility]  . IR GENERIC HISTORICAL  05/19/2016   IR NEURO EACH ADD'L AFTER BASIC UNI LEFT (MS) 05/19/2016 Luanne Bras, MD [facility]  . IR GENERIC HISTORICAL  05/19/2016   IR ANGIOGRAM FOLLOW UP STUDY 05/19/2016 Luanne Bras, MD [facility]  . IR GENERIC HISTORICAL  05/19/2016   IR ANGIO VERTEBRAL SEL VERTEBRAL UNI L MOD SED 05/19/2016 Luanne Bras, MD [facility]  . IR GENERIC HISTORICAL  05/19/2016   IR TRANSCATH/EMBOLIZ 05/19/2016 Luanne Bras, MD [facility]  . IR GENERIC HISTORICAL  05/19/2016   IR 3D INDEPENDENT WKST 05/19/2016 Luanne Bras, MD [facility]  . IR GENERIC HISTORICAL  06/03/2016   IR RADIOLOGIST EVAL & MGMT 06/03/2016 [facility]  . IR GENERIC HISTORICAL  07/20/2016   IR RADIOLOGIST EVAL & MGMT 07/20/2016 [facility]  . left rotator cuff surgery  2000  .  PERMANENT PACEMAKER INSERTION N/A 08/27/2011   Procedure: PERMANENT PACEMAKER INSERTION;  Surgeon: Evans Lance, MD;  Location: [facility] CATH LAB;  Service: Cardiovascular;  Laterality: N/A;  . PUNCH BIOPSY OF SKIN  08/2009   5 mm punch biopsy on upper mid back melanotic appearing lesion  . RADIOLOGY WITH ANESTHESIA N/A 05/19/2016   Procedure: Embolization;  Surgeon: Luanne Bras, MD;  Location: [facility];  Service: Radiology;  Laterality: N/A;    Social History   Socioeconomic History  . Marital status: Married    Spouse name: Not on file  . Number of children: 4  . Years of education: Not on file  . Highest education level: Not on file  Occupational History  . Occupation: retired    Comment: retired Art gallery manager  Social Needs  . Financial resource strain: Not on file  . Food insecurity:    Worry: Not on file    Inability: Not on file  . Transportation needs:    Medical: Not on file    Non-medical: Not on file  Tobacco Use  . Smoking status: Former Smoker    Packs/day: 0.50    Years: 15.00    Pack years: 7.50    Types: Cigarettes    Last attempt to quit: 09/13/1965    Years since quitting: 52.3  . Smokeless tobacco: Never Used  Substance and Sexual Activity  . Alcohol use: No    Alcohol/week: 0.0 oz  . Drug use: No  . Sexual activity: Not Currently  Lifestyle  . Physical activity:    Days per week: Not on file    Minutes per session: Not on file  . Stress: Not on file  Relationships  . Social connections:    Talks on phone: Not on file    Gets together: Not on file    Attends religious service: Not on file    Active member of club or organization: Not on file    Attends meetings of clubs or organizations: Not on file    Relationship status: Not on file  Other Topics Concern  . Not  on file  Social History Narrative   Moved from [place] in [place] alone; lives in 2 story home but stays downstairs   3 children / 1 other;     Family History  Problem  Relation Age of Onset  . Other Father 66       [place]  . Heart attack Mother 63  . Heart disease Mother        ASHD  . COPD Brother   . Hypertension Brother   . Heart disease Brother        ASHD  . Heart disease Brother        ASHD  . Heart disease Sister        ASHD  . COPD Brother   . Coronary artery disease Unknown     Review of Systems  Constitutional: Negative for chills and fever.  Respiratory: Negative for cough, shortness of breath and wheezing.   Cardiovascular: Negative for chest pain, palpitations and leg swelling.  Gastrointestinal: Negative for abdominal pain.  Genitourinary: Negative for dysuria and hematuria.       Incontinence  Musculoskeletal: Positive for back pain (poor balance).  Neurological: Positive for dizziness (occ). Negative for light-headedness and headaches.       Objective:   Vitals:   01/09/18 1455  BP: 130/76  Pulse: 66  Resp: 16  Temp: (!) 97.5 F (36.4 C)  SpO2: 98%   BP Readings from Last 3 Encounters:  01/09/18 130/76  12/19/17 118/70  10/26/17 130/82   Wt Readings from Last 3 Encounters:  01/09/18 129 lb (58.5 kg)  12/19/17 127 lb (57.6 kg)  10/26/17 131 lb 3.2 oz (59.5 kg)   Body mass index is 22.14 kg/m.   Physical Exam    Constitutional: Appears well-developed and well-nourished. No distress.  HENT:  Head: Normocephalic and atraumatic.  Neck: Neck supple. No tracheal deviation present. No thyromegaly present.  No cervical lymphadenopathy Cardiovascular: Normal rate, regular rhythm and normal heart sounds.   No murmur heard. No carotid bruit .  No edema Pulmonary/Chest: Effort normal and breath sounds normal. No respiratory distress. No has no wheezes. No rales. Abdomen: Soft, nondistended, nontender Skin: Skin is warm and dry. Not diaphoretic.  Psychiatric: Normal mood and affect. Behavior is normal.      Assessment & Plan:    See Problem List for Assessment and Plan of chronic medical problems.

## 2018-01-07 NOTE — Patient Instructions (Addendum)
  Test(s) ordered today. Your results will be released to [place] (or called to you) after review, usually within 72hours after test completion. If any changes need to be made, you will be notified at that same time.  Medications reviewed and updated. No changes recommended at this time.   A referral was ordered for urology  Please followup in 6 months

## 2018-01-09 ENCOUNTER — Encounter: Payer: Self-pay | Admitting: Internal Medicine

## 2018-01-09 ENCOUNTER — Other Ambulatory Visit (INDEPENDENT_AMBULATORY_CARE_PROVIDER_SITE_OTHER): Payer: Medicare Other

## 2018-01-09 ENCOUNTER — Ambulatory Visit (INDEPENDENT_AMBULATORY_CARE_PROVIDER_SITE_OTHER): Payer: Medicare Other | Admitting: Internal Medicine

## 2018-01-09 VITALS — BP 130/76 | HR 66 | Temp 97.5°F | Resp 16 | Wt 129.0 lb

## 2018-01-09 DIAGNOSIS — I25718 Atherosclerosis of autologous vein coronary artery bypass graft(s) with other forms of angina pectoris: Secondary | ICD-10-CM | POA: Diagnosis not present

## 2018-01-09 DIAGNOSIS — R413 Other amnesia: Secondary | ICD-10-CM

## 2018-01-09 DIAGNOSIS — N3941 Urge incontinence: Secondary | ICD-10-CM

## 2018-01-09 DIAGNOSIS — N400 Enlarged prostate without lower urinary tract symptoms: Secondary | ICD-10-CM

## 2018-01-09 DIAGNOSIS — I10 Essential (primary) hypertension: Secondary | ICD-10-CM

## 2018-01-09 DIAGNOSIS — E785 Hyperlipidemia, unspecified: Secondary | ICD-10-CM | POA: Diagnosis not present

## 2018-01-09 DIAGNOSIS — F0390 Unspecified dementia without behavioral disturbance: Secondary | ICD-10-CM | POA: Insufficient documentation

## 2018-01-09 DIAGNOSIS — R7303 Prediabetes: Secondary | ICD-10-CM | POA: Diagnosis not present

## 2018-01-09 LAB — COMPREHENSIVE METABOLIC PANEL
ALBUMIN: 4.2 g/dL (ref 3.5–5.2)
ALT: 9 U/L (ref 0–53)
AST: 14 U/L (ref 0–37)
Alkaline Phosphatase: 67 U/L (ref 39–117)
BUN: 12 mg/dL (ref 6–23)
CHLORIDE: 102 meq/L (ref 96–112)
CO2: 32 meq/L (ref 19–32)
CREATININE: 0.83 mg/dL (ref 0.40–1.50)
Calcium: 9.4 mg/dL (ref 8.4–10.5)
GFR: 93.65 mL/min (ref >60.00)
GLUCOSE: 109 mg/dL — AB (ref 70–99)
Potassium: 4.2 mEq/L (ref 3.5–5.1)
Sodium: 138 mEq/L (ref 135–145)
Total Bilirubin: 0.5 mg/dL (ref 0.2–1.2)
Total Protein: 7.1 g/dL (ref 6.0–8.3)

## 2018-01-09 LAB — LIPID PANEL
CHOL/HDL RATIO: 4
CHOLESTEROL: 134 mg/dL (ref 0–200)
HDL: 34.8 mg/dL — ABNORMAL LOW (ref >39.00)
NONHDL: 99.59
Triglycerides: 355 mg/dL — ABNORMAL HIGH (ref 0.0–149.0)
VLDL: 71 mg/dL — ABNORMAL HIGH (ref 0.0–40.0)

## 2018-01-09 LAB — VITAMIN B12: Vitamin B-12: 321 pg/mL (ref 211–911)

## 2018-01-09 LAB — CBC WITH DIFFERENTIAL/PLATELET
BASOS PCT: 0.9 % (ref 0.0–3.0)
Basophils Absolute: 0.1 10*3/uL (ref 0.0–0.1)
EOS ABS: 0.3 10*3/uL (ref 0.0–0.7)
Eosinophils Relative: 5.7 % — ABNORMAL HIGH (ref 0.0–5.0)
HCT: 42 % (ref 39.0–52.0)
Hemoglobin: 14.2 g/dL (ref 13.0–17.0)
LYMPHS ABS: 1.6 10*3/uL (ref 0.7–4.0)
Lymphocytes Relative: 28.7 % (ref 12.0–46.0)
MCHC: 33.8 g/dL (ref 30.0–36.0)
MCV: 92.2 fl (ref 78.0–100.0)
MONO ABS: 0.6 10*3/uL (ref 0.1–1.0)
Monocytes Relative: 10.1 % (ref 3.0–12.0)
NEUTROS ABS: 3.1 10*3/uL (ref 1.4–7.7)
Neutrophils Relative %: 54.6 % (ref 43.0–77.0)
PLATELETS: 223 10*3/uL (ref 150.0–400.0)
RBC: 4.56 Mil/uL (ref 4.22–5.81)
RDW: 13.6 % (ref 11.5–15.5)
WBC: 5.7 10*3/uL (ref 4.0–10.5)

## 2018-01-09 LAB — HEMOGLOBIN A1C: Hgb A1c MFr Bld: 5.7 % (ref 4.6–6.5)

## 2018-01-09 LAB — LDL CHOLESTEROL, DIRECT: LDL DIRECT: 62 mg/dL

## 2018-01-09 LAB — TSH: TSH: 3.17 u[IU]/mL (ref 0.35–4.50)

## 2018-01-09 NOTE — Assessment & Plan Note (Signed)
Continue statin Check lipid panel, CMP 

## 2018-01-09 NOTE — Assessment & Plan Note (Signed)
Long history of BPH Recently experiencing incontinence Possibly secondary to worsening of BPH, NPH also possible, but seems less likely given that he is following with neurology and neurosurgery Will refer to urology for further evaluation of BPH Continue Flomax for now

## 2018-01-09 NOTE — Assessment & Plan Note (Signed)
Asymptomatic Following with cardiology Exercising regularly Continue current medications CMP, CBC, TSH

## 2018-01-09 NOTE — Assessment & Plan Note (Signed)
Check A1c Continue regular exercise and diabetic diet

## 2018-01-09 NOTE — Assessment & Plan Note (Signed)
Experiencing incontinence for the past 2 weeks As above possibly related to worsening of BPH, less likely NPH No evidence of UTI Will refer to urology for further evaluation Continue Flomax

## 2018-01-09 NOTE — Assessment & Plan Note (Addendum)
BP well controlled Current regimen effective and well tolerated Continue current medications at current doses CMP, CBC

## 2018-01-09 NOTE — Assessment & Plan Note (Signed)
Not new Will check B12, TSH Advised to discuss with neurology possibility of NPH

## 2018-01-15 ENCOUNTER — Encounter: Payer: Self-pay | Admitting: Internal Medicine

## 2018-01-17 ENCOUNTER — Other Ambulatory Visit: Payer: Self-pay | Admitting: Internal Medicine

## 2018-01-19 ENCOUNTER — Ambulatory Visit: Payer: Medicare Other | Admitting: Family Medicine

## 2018-02-06 NOTE — Progress Notes (Signed)
Subjective:    Patient ID: Shaun James, male    DOB: [birth year], 84 y.o.   MRN: [id number]  HPI The patient is here for follow up - to discuss BP-it has been higher at home.  Hypertension: He is taking his medication daily. He is compliant with a low sodium diet.  He denies chest pain, palpitations, edema, shortness of breath.  He does get left posterior headaches intermittently, which is somewhat chronic and unchanged.  He is exercising regularly - walking.  He does monitor his blood pressure at home -low 140's / 90's.  After walking on treadmill it goes to 130's/70's.       BP Readings from Last 3 Encounters:  02/07/18 130/82  01/09/18 130/76  12/19/17 118/70   He was concerned that his blood pressure was too high, which is why he is here today.  Medications and allergies reviewed with patient and updated if appropriate.  Patient Active Problem List   Diagnosis Date Noted  . Memory difficulties 01/09/2018  . Urgency incontinence 01/09/2018  . Ulnar neuropathy of right upper extremity 12/19/2017  . Anxiety 12/19/2017  . Acute pain of right knee 10/13/2017  . Rib pain on right side 10/13/2017  . Sleep difficulties 09/21/2017  . Concussion with no loss of consciousness 09/21/2017  . Acute left ankle pain 09/21/2017  . Subdural hemorrhage ([place]) 05/09/2017  . Subarachnoid hemorrhage ([place]) 05/09/2017  . Prediabetes 04/06/2017  . Weight loss 04/06/2017  . Cervical radiculopathy at C8 10/08/2016  . Paroxysmal atrial fibrillation (HCC)   . Expressive aphasia 08/18/2016  . Essential hypertension, benign 08/17/2016  . Numbness and tingling of right arm 08/17/2016  . AAA (abdominal aortic aneurysm) without rupture ([place]) 04/25/2016  . Pulmonary nodule 04/25/2016  . Bilateral hearing loss 03/22/2016  . Aneurysm, cerebral, nonruptured 03/11/2016  . Carotid artery stenosis 03/11/2016  . BPPV (benign paroxysmal positional vertigo) 03/10/2016  . Ataxia   . Burning tongue 08/12/2015    . GERD (gastroesophageal reflux disease) 08/12/2015  . BPH without urinary obstruction 04/10/2015  . 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 Chronic headache   . Osteoarthritis   . Cardiac pacemaker in situ 10/18/2011  . CAD (coronary artery disease), autologous vein bypass graft 09/13/2011  . Sick sinus syndrome ([place]) 09/13/2011  . Hyperlipidemia with target LDL less than 70 04/16/2008  . Acute myocardial infarction ([place]) 04/16/2008  . Pulmonary fibrosis ([place]) 04/16/2008    Current Outpatient Medications on File Prior to Visit  Medication Sig Dispense Refill  . acetaminophen (TYLENOL) 500 MG tablet Take 1 tablet (500 mg total) by mouth daily as needed for headache. 14 tablet 0  . CARTIA XT 120 MG 24 hr capsule TAKE 1 CAPSULE DAILY 90 capsule 3  . nitroGLYCERIN (NITROSTAT) 0.4 MG SL tablet Place 0.4 mg under the tongue every 5 (five) minutes as needed for chest pain.    Marland Kitchen PARoxetine (PAXIL) 10 MG tablet Take 1 tablet (10 mg total) by mouth daily. 30 tablet 0  . ranolazine (RANEXA) 500 MG 12 hr tablet Take 1 tablet (500 mg total) by mouth 2 (two) times daily. 180 tablet 3  . simvastatin (ZOCOR) 20 MG tablet TAKE 1 TABLET EVERY EVENING 90 tablet 1  . tamsulosin (FLOMAX) 0.4 MG CAPS capsule TAKE 1 CAPSULE DAILY 90 capsule 2   No current facility-administered medications on file prior to visit.     Past Medical History:  Diagnosis Date  . AAA (abdominal aortic aneurysm) ([place])  3.1 cm AAA, reevaluated 08/2009 per ultrasound - stable  . Allergic rhinitis   . Aortic sclerosis    Probable AS on physical exam, 2010  . Aortic stenosis    mild AS 2014 echo  . Arthritis   . Atrial fibrillation (HCC)    chronic anticoag  . Atrial flutter ([place])   . Basal cell carcinoma   . Benign positional vertigo   . BPH (benign prostatic hyperplasia)   . CAD in native artery    a) s/p CABG '98; LIMA-LAD, SVG-D1, SVG-OM1, SVG-PDA); b) CATH -2009: midLAD & RCA 100%, OM2 100% wtih severe native  Cx; Grafts patent with ~30-50% SVG-D1 & ~30-40% SVG-OM, EF 45%; c) Lexiscan Cardiolite 02/2014: EF 61%, No Ischemia; subtle fixed anteroseptal defect.  . Cardiac pacemaker in [place]  . Coronary atherosclerosis of native coronary artery   . Diverticulosis of colon (without mention of hemorrhage)   . DJD (degenerative joint disease)   . Esophageal reflux   . Essential hypertension, benign   . GERD (gastroesophageal reflux disease)   . Headache    after brain aneurysm  . History of tuberculosis    remote Hx  . Inflamed seborrheic keratosis   . Intermittent confusion   . Internal hemorrhoids    severe  . Mixed hyperlipidemia   . Mouth burn   . Myocardial infarction ([place])   . Near syncope    uncertain cause. R/O arrhythmia, R/O med effect  . Osteoarthritis 06/14/2012  . Peptic ulcer, unspecified site, unspecified as acute or chronic, without mention of hemorrhage, perforation, or obstruction   . Personal history of colonic polyps   . Pneumonia   . Presbycusis of both ears    Bilateral hearing aids  . Presence of permanent cardiac pacemaker   . Prostatitis dx 12/2102   E coli Ucx  . SSS (sick sinus syndrome) (HCC)    syncope, s/p ppm 12/12  . Systolic murmur    Worrisome for AS. AS could cause exertional fatigue.    Past Surgical History:  Procedure Laterality Date  . CARDIAC CATHETERIZATION  2002  . cataracts     bilateral  . COLONOSCOPY    . CORONARY ARTERY BYPASS GRAFT  1997   (LIMA to LAD, SVG to diagonal-50% closed on catheterization in 1999, SVG to OM1, SVG to PDA) Repeat cath 2009 with patent grafts  . EXCISION OF TONGUE LESION    . EYE SURGERY Bilateral    cataract surgery with lens implant  . HEMORRHOID SURGERY  07/31/2010  . INSERT / REPLACE / REMOVE PACEMAKER  08/27/2011   PPM implant  . IR GENERIC HISTORICAL  04/06/2016   IR ANGIO VERTEBRAL SEL SUBCLAVIAN INNOMINATE UNI L MOD SED 04/06/2016 Luanne Bras, MD [facility]  . IR GENERIC  HISTORICAL  04/06/2016   IR ANGIO VERTEBRAL SEL VERTEBRAL UNI R MOD SED 04/06/2016 Luanne Bras, MD [facility]  . IR GENERIC HISTORICAL  04/06/2016   IR ANGIO INTRA EXTRACRAN SEL COM CAROTID INNOMINATE BILAT MOD SED 04/06/2016 Luanne Bras, MD [facility]  . IR GENERIC HISTORICAL  05/19/2016   IR ANGIO INTRA EXTRACRAN SEL INTERNAL CAROTID UNI L MOD SED 05/19/2016 Luanne Bras, MD [facility]  . IR GENERIC HISTORICAL  05/19/2016   IR NEURO EACH ADD'L AFTER BASIC UNI LEFT (MS) 05/19/2016 Luanne Bras, MD [facility]  . IR GENERIC HISTORICAL  05/19/2016   IR ANGIOGRAM FOLLOW UP STUDY 05/19/2016 Luanne Bras, MD [facility]  . IR GENERIC  HISTORICAL  05/19/2016   IR ANGIO VERTEBRAL SEL VERTEBRAL UNI L MOD SED 05/19/2016 Luanne Bras, MD [facility]  . IR GENERIC HISTORICAL  05/19/2016   IR TRANSCATH/EMBOLIZ 05/19/2016 Luanne Bras, MD [facility]  . IR GENERIC HISTORICAL  05/19/2016   IR 3D INDEPENDENT WKST 05/19/2016 Luanne Bras, MD [facility]  . IR GENERIC HISTORICAL  06/03/2016   IR RADIOLOGIST EVAL & MGMT 06/03/2016 [facility]  . IR GENERIC HISTORICAL  07/20/2016   IR RADIOLOGIST EVAL & MGMT 07/20/2016 [facility]  . left rotator cuff surgery  2000  . PERMANENT PACEMAKER INSERTION N/A 08/27/2011   Procedure: PERMANENT PACEMAKER INSERTION;  Surgeon: Evans Lance, MD;  Location: [facility] CATH LAB;  Service: Cardiovascular;  Laterality: N/A;  . PUNCH BIOPSY OF SKIN  08/2009   5 mm punch biopsy on upper mid back melanotic appearing lesion  . RADIOLOGY WITH ANESTHESIA N/A 05/19/2016   Procedure: Embolization;  Surgeon: Luanne Bras, MD;  Location: [facility];  Service: Radiology;  Laterality: N/A;    Social History   Socioeconomic History  . Marital status: Married    Spouse name: Not on file  . Number of children: 4  . Years of education: Not on file  . Highest education level: Not on file  Occupational History  . Occupation: retired    Comment: retired  Art gallery manager  Social Needs  . Financial resource strain: Not on file  . Food insecurity:    Worry: Not on file    Inability: Not on file  . Transportation needs:    Medical: Not on file    Non-medical: Not on file  Tobacco Use  . Smoking status: Former Smoker    Packs/day: 0.50    Years: 15.00    Pack years: 7.50    Types: Cigarettes    Last attempt to quit: 09/13/1965    Years since quitting: 52.4  . Smokeless tobacco: Never Used  Substance and Sexual Activity  . Alcohol use: No    Alcohol/week: 0.0 oz  . Drug use: No  . Sexual activity: Not Currently  Lifestyle  . Physical activity:    Days per week: Not on file    Minutes per session: Not on file  . Stress: Not on file  Relationships  . Social connections:    Talks on phone: Not on file    Gets together: Not on file    Attends religious service: Not on file    Active member of club or organization: Not on file    Attends meetings of clubs or organizations: Not on file    Relationship status: Not on file  Other Topics Concern  . Not on file  Social History Narrative   Moved from [place] in [place] alone; lives in 2 story home but stays downstairs   3 children / 1 other;     Family History  Problem Relation Age of Onset  . Other Father 42       [place]  . Heart attack Mother 25  . Heart disease Mother        ASHD  . COPD Brother   . Hypertension Brother   . Heart disease Brother        ASHD  . Heart disease Brother        ASHD  . Heart disease Sister        ASHD  . COPD Brother   . Coronary artery disease Unknown  Review of Systems  Constitutional: Negative for chills and fever.  Respiratory: Negative for shortness of breath.   Cardiovascular: Negative for chest pain, palpitations and leg swelling.  Neurological: Positive for light-headedness and headaches (left posterior head - intermittent - no change).       Objective:   Vitals:   02/07/18 1115  BP: 130/82  Pulse: (!) 56    Resp: 16  Temp: 97.6 F (36.4 C)  SpO2: 97%   BP Readings from Last 3 Encounters:  02/07/18 130/82  01/09/18 130/76  12/19/17 118/70   Wt Readings from Last 3 Encounters:  02/07/18 129 lb (58.5 kg)  01/09/18 129 lb (58.5 kg)  12/19/17 127 lb (57.6 kg)   Body mass index is 22.14 kg/m.   Physical Exam    Constitutional: Appears well-developed and well-nourished. No distress.  HENT:  Head: Normocephalic and atraumatic.  Neck: Neck supple. No tracheal deviation present. No thyromegaly present.  No cervical lymphadenopathy Cardiovascular: Normal rate, regular rhythm and normal heart sounds.   2/6 systolic murmur heard. No carotid bruit .  No edema Pulmonary/Chest: Effort normal and breath sounds normal. No respiratory distress. No has no wheezes. No rales.  Skin: Skin is warm and dry. Not diaphoretic.  Psychiatric: Normal mood and affect. Behavior is normal.      Assessment & Plan:    See Problem List for Assessment and Plan of chronic medical problems.

## 2018-02-07 ENCOUNTER — Ambulatory Visit: Payer: Medicare Other | Admitting: Internal Medicine

## 2018-02-07 ENCOUNTER — Encounter: Payer: Self-pay | Admitting: Internal Medicine

## 2018-02-07 VITALS — BP 130/82 | HR 56 | Temp 97.6°F | Resp 16 | Wt 129.0 lb

## 2018-02-07 DIAGNOSIS — I10 Essential (primary) hypertension: Secondary | ICD-10-CM

## 2018-02-07 NOTE — Patient Instructions (Addendum)
   Medications reviewed and updated.  No changes recommended at this time.     

## 2018-02-07 NOTE — Assessment & Plan Note (Signed)
Overall blood pressure has been adequately controlled He does see slightly higher numbers low 140s/90 at times, but his blood pressure is often in the 130s. Blood pressure has been well controlled here the last 3 visits His neurosurgeon at one point did recommend not keeping his blood pressure too well controlled to make sure there was good perfusion to his brain Overall blood pressure adequately controlled Continue current medication at current dose

## 2018-02-24 ENCOUNTER — Ambulatory Visit: Payer: Medicare Other | Admitting: Family Medicine

## 2018-02-24 ENCOUNTER — Encounter: Payer: Self-pay | Admitting: Family Medicine

## 2018-02-24 DIAGNOSIS — R21 Rash and other nonspecific skin eruption: Secondary | ICD-10-CM

## 2018-02-24 MED ORDER — MUPIROCIN 2 % EX OINT: TOPICAL_OINTMENT | CUTANEOUS | 3 refills | Status: DC

## 2018-02-24 NOTE — Assessment & Plan Note (Signed)
Appears to be impetigo.  - Bactroban  - given indications to follow up
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
## 2018-02-24 NOTE — Patient Instructions (Addendum)
Please try the ointment  Please let me know if you don't improvement of your symptoms.

## 2018-03-05 NOTE — Progress Notes (Signed)
Subjective:    Patient ID: Shaun James, male    DOB: 10/16/1930, 84 y.o.   MRN: [id number]  HPI The patient is here for follow up.  He is flying tomorrow to [place] and wanted to make sure he was ok to fly. Overall, he feels well and has no concerns.   No recent falls.  He is exercising - treadmill and bicycle.  He is taking all his medications.    CAD, Afib, SSS with pacemaker, Hypertension: He is taking his medication daily. He is compliant with a low sodium diet.  He denies chest pain, palpitations, edema, shortness of breath and regular headaches. He is exercising regularly.     Prediabetes:  He is compliant with a low sugar/carbohydrate diet.  He is exercising regularly.   Medications and allergies reviewed with patient and updated if appropriate.  Patient Active Problem List   Diagnosis Date Noted  . Memory difficulties 01/09/2018  . Urgency incontinence 01/09/2018  . Ulnar neuropathy of right upper extremity 12/19/2017  . Anxiety 12/19/2017  . Acute pain of right knee 10/13/2017  . Rib pain on right side 10/13/2017  . Sleep difficulties 09/21/2017  . Concussion with no loss of consciousness 09/21/2017  . Acute left ankle pain 09/21/2017  . Subdural hemorrhage ([place]) 05/09/2017  . Subarachnoid hemorrhage ([place]) 05/09/2017  . Prediabetes 04/06/2017  . Rash 04/06/2017  . Weight loss 04/06/2017  . Cervical radiculopathy at C8 10/08/2016  . Paroxysmal atrial fibrillation (HCC)   . Expressive aphasia 08/18/2016  . Essential hypertension, benign 08/17/2016  . Numbness and tingling of right arm 08/17/2016  . AAA (abdominal aortic aneurysm) without rupture ([place]) 04/25/2016  . Pulmonary nodule 04/25/2016  . Bilateral hearing loss 03/22/2016  . Aneurysm, cerebral, nonruptured 03/11/2016  . Carotid artery stenosis 03/11/2016  . BPPV (benign paroxysmal positional vertigo) 03/10/2016  . Ataxia   . Burning tongue 08/12/2015  . GERD (gastroesophageal reflux disease) 08/12/2015   . BPH without urinary obstruction 04/10/2015  . 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 Chronic headache   . Osteoarthritis   . Cardiac pacemaker in situ 10/18/2011  . CAD (coronary artery disease), autologous vein bypass graft 09/13/2011  . Sick sinus syndrome ([place]) 09/13/2011  . Hyperlipidemia with target LDL less than 70 04/16/2008  . Acute myocardial infarction ([place]) 04/16/2008  . Pulmonary fibrosis ([place]) 04/16/2008    Current Outpatient Medications on File Prior to Visit  Medication Sig Dispense Refill  . acetaminophen (TYLENOL) 500 MG tablet Take 1 tablet (500 mg total) by mouth daily as needed for headache. 14 tablet 0  . CARTIA XT 120 MG 24 hr capsule TAKE 1 CAPSULE DAILY 90 capsule 3  . mupirocin ointment (BACTROBAN) 2 % Apply to affected area TID for 7 days. 30 g 3  . nitroGLYCERIN (NITROSTAT) 0.4 MG SL tablet Place 0.4 mg under the tongue every 5 (five) minutes as needed for chest pain.    Marland Kitchen PARoxetine (PAXIL) 10 MG tablet Take 1 tablet (10 mg total) by mouth daily. 30 tablet 0  . ranolazine (RANEXA) 500 MG 12 hr tablet Take 1 tablet (500 mg total) by mouth 2 (two) times daily. 180 tablet 3  . simvastatin (ZOCOR) 20 MG tablet TAKE 1 TABLET EVERY EVENING 90 tablet 1  . tamsulosin (FLOMAX) 0.4 MG CAPS capsule TAKE 1 CAPSULE DAILY 90 capsule 2   No current facility-administered medications on file prior to visit.     Past Medical History:  Diagnosis Date  .  AAA (abdominal aortic aneurysm) (HCC)    3.1 cm AAA, reevaluated 08/2009 per ultrasound - stable  . Allergic rhinitis   . Aortic sclerosis    Probable AS on physical exam, 2010  . Aortic stenosis    mild AS 2014 echo  . Arthritis   . Atrial fibrillation (HCC)    chronic anticoag  . Atrial flutter ([place])   . Basal cell carcinoma   . Benign positional vertigo   . BPH (benign prostatic hyperplasia)   . CAD in native artery    a) s/p CABG '98; LIMA-LAD, SVG-D1, SVG-OM1, SVG-PDA); b) CATH -2009: midLAD & RCA  100%, OM2 100% wtih severe native Cx; Grafts patent with ~30-50% SVG-D1 & ~30-40% SVG-OM, EF 45%; c) Lexiscan Cardiolite 02/2014: EF 61%, No Ischemia; subtle fixed anteroseptal defect.  . Cardiac pacemaker in [place]  . Coronary atherosclerosis of native coronary artery   . Diverticulosis of colon (without mention of hemorrhage)   . DJD (degenerative joint disease)   . Esophageal reflux   . Essential hypertension, benign   . GERD (gastroesophageal reflux disease)   . Headache    after brain aneurysm  . History of tuberculosis    remote Hx  . Inflamed seborrheic keratosis   . Intermittent confusion   . Internal hemorrhoids    severe  . Mixed hyperlipidemia   . Mouth burn   . Myocardial infarction ([place])   . Near syncope    uncertain cause. R/O arrhythmia, R/O med effect  . Osteoarthritis 06/14/2012  . Peptic ulcer, unspecified site, unspecified as acute or chronic, without mention of hemorrhage, perforation, or obstruction   . Personal history of colonic polyps   . Pneumonia   . Presbycusis of both ears    Bilateral hearing aids  . Presence of permanent cardiac pacemaker   . Prostatitis dx 12/2102   E coli Ucx  . SSS (sick sinus syndrome) (HCC)    syncope, s/p ppm 12/12  . Systolic murmur    Worrisome for AS. AS could cause exertional fatigue.    Past Surgical History:  Procedure Laterality Date  . CARDIAC CATHETERIZATION  2002  . cataracts     bilateral  . COLONOSCOPY    . CORONARY ARTERY BYPASS GRAFT  1997   (LIMA to LAD, SVG to diagonal-50% closed on catheterization in 1999, SVG to OM1, SVG to PDA) Repeat cath 2009 with patent grafts  . EXCISION OF TONGUE LESION    . EYE SURGERY Bilateral    cataract surgery with lens implant  . HEMORRHOID SURGERY  07/31/2010  . INSERT / REPLACE / REMOVE PACEMAKER  08/27/2011   PPM implant  . IR GENERIC HISTORICAL  04/06/2016   IR ANGIO VERTEBRAL SEL SUBCLAVIAN INNOMINATE UNI L MOD SED 04/06/2016 Luanne Bras, MD  [facility]  . IR GENERIC HISTORICAL  04/06/2016   IR ANGIO VERTEBRAL SEL VERTEBRAL UNI R MOD SED 04/06/2016 Luanne Bras, MD [facility]  . IR GENERIC HISTORICAL  04/06/2016   IR ANGIO INTRA EXTRACRAN SEL COM CAROTID INNOMINATE BILAT MOD SED 04/06/2016 Luanne Bras, MD [facility]  . IR GENERIC HISTORICAL  05/19/2016   IR ANGIO INTRA EXTRACRAN SEL INTERNAL CAROTID UNI L MOD SED 05/19/2016 Luanne Bras, MD [facility]  . IR GENERIC HISTORICAL  05/19/2016   IR NEURO EACH ADD'L AFTER BASIC UNI LEFT (MS) 05/19/2016 Luanne Bras, MD [facility]  . IR GENERIC HISTORICAL  05/19/2016   IR ANGIOGRAM FOLLOW UP STUDY 05/19/2016 Willaim Rayas  Estanislado Pandy, MD [facility]  . IR GENERIC HISTORICAL  05/19/2016   IR ANGIO VERTEBRAL SEL VERTEBRAL UNI L MOD SED 05/19/2016 Luanne Bras, MD [facility]  . IR GENERIC HISTORICAL  05/19/2016   IR TRANSCATH/EMBOLIZ 05/19/2016 Luanne Bras, MD [facility]  . IR GENERIC HISTORICAL  05/19/2016   IR 3D INDEPENDENT WKST 05/19/2016 Luanne Bras, MD [facility]  . IR GENERIC HISTORICAL  06/03/2016   IR RADIOLOGIST EVAL & MGMT 06/03/2016 [facility]  . IR GENERIC HISTORICAL  07/20/2016   IR RADIOLOGIST EVAL & MGMT 07/20/2016 [facility]  . left rotator cuff surgery  2000  . PERMANENT PACEMAKER INSERTION N/A 08/27/2011   Procedure: PERMANENT PACEMAKER INSERTION;  Surgeon: Evans Lance, MD;  Location: [facility] CATH LAB;  Service: Cardiovascular;  Laterality: N/A;  . PUNCH BIOPSY OF SKIN  08/2009   5 mm punch biopsy on upper mid back melanotic appearing lesion  . RADIOLOGY WITH ANESTHESIA N/A 05/19/2016   Procedure: Embolization;  Surgeon: Luanne Bras, MD;  Location: [facility];  Service: Radiology;  Laterality: N/A;    Social History   Socioeconomic History  . Marital status: Married    Spouse name: Not on file  . Number of children: 4  . Years of education: Not on file  . Highest education level: Not on file  Occupational History  . Occupation:  retired    Comment: retired Art gallery manager  Social Needs  . Financial resource strain: Not on file  . Food insecurity:    Worry: Not on file    Inability: Not on file  . Transportation needs:    Medical: Not on file    Non-medical: Not on file  Tobacco Use  . Smoking status: Former Smoker    Packs/day: 0.50    Years: 15.00    Pack years: 7.50    Types: Cigarettes    Last attempt to quit: 09/13/1965    Years since quitting: 52.5  . Smokeless tobacco: Never Used  Substance and Sexual Activity  . Alcohol use: No    Alcohol/week: 0.0 oz  . Drug use: No  . Sexual activity: Not Currently  Lifestyle  . Physical activity:    Days per week: Not on file    Minutes per session: Not on file  . Stress: Not on file  Relationships  . Social connections:    Talks on phone: Not on file    Gets together: Not on file    Attends religious service: Not on file    Active member of club or organization: Not on file    Attends meetings of clubs or organizations: Not on file    Relationship status: Not on file  Other Topics Concern  . Not on file  Social History Narrative   Moved from [place] in [place] alone; lives in 2 story home but stays downstairs   3 children / 1 other;     Family History  Problem Relation Age of Onset  . Other Father 26       [place]  . Heart attack Mother 6  . Heart disease Mother        ASHD  . COPD Brother   . Hypertension Brother   . Heart disease Brother        ASHD  . Heart disease Brother        ASHD  . Heart disease Sister        ASHD  . COPD Brother   .  Coronary artery disease Unknown     Review of Systems  Constitutional: Negative for appetite change, chills, fatigue and fever.  Respiratory: Negative for cough, shortness of breath and wheezing.   Cardiovascular: Negative for chest pain, palpitations and leg swelling.  Gastrointestinal: Negative for abdominal pain and nausea.  Neurological: Negative for dizziness, light-headedness  and headaches.       Objective:   Vitals:   03/06/18 0835  BP: 124/80  Pulse: 74  Resp: 16  Temp: 97.9 F (36.6 C)  SpO2: 97%   BP Readings from Last 3 Encounters:  03/06/18 124/80  02/24/18 118/64  02/07/18 130/82   Wt Readings from Last 3 Encounters:  03/06/18 128 lb (58.1 kg)  02/24/18 128 lb (58.1 kg)  02/07/18 129 lb (58.5 kg)   Body mass index is 21.97 kg/m.   Physical Exam    Constitutional: Appears well-developed and well-nourished. No distress.  HENT:  Head: Normocephalic and atraumatic.  Neck: Neck supple. No tracheal deviation present. No thyromegaly present.  No cervical lymphadenopathy Cardiovascular: Normal rate, regular rhythm and normal heart sounds.   No murmur heard. No carotid bruit .  No edema Pulmonary/Chest: Effort normal and breath sounds normal. No respiratory distress. No has no wheezes. No rales.  Skin: Skin is warm and dry. Not diaphoretic.  Psychiatric: Normal mood and affect. Behavior is normal.      Assessment & Plan:    See Problem List for Assessment and Plan of chronic medical problems.

## 2018-03-06 ENCOUNTER — Ambulatory Visit: Payer: Medicare Other | Admitting: Internal Medicine

## 2018-03-06 ENCOUNTER — Encounter: Payer: Self-pay | Admitting: Internal Medicine

## 2018-03-06 VITALS — BP 124/80 | HR 74 | Temp 97.9°F | Resp 16 | Wt 128.0 lb

## 2018-03-06 DIAGNOSIS — R7303 Prediabetes: Secondary | ICD-10-CM | POA: Diagnosis not present

## 2018-03-06 DIAGNOSIS — I25718 Atherosclerosis of autologous vein coronary artery bypass graft(s) with other forms of angina pectoris: Secondary | ICD-10-CM

## 2018-03-06 DIAGNOSIS — I10 Essential (primary) hypertension: Secondary | ICD-10-CM | POA: Diagnosis not present

## 2018-03-06 DIAGNOSIS — I480 Paroxysmal atrial fibrillation: Secondary | ICD-10-CM

## 2018-03-06 NOTE — Assessment & Plan Note (Signed)
Asymptomatic Rate controlled

## 2018-03-06 NOTE — Patient Instructions (Signed)
   Medications reviewed and updated.  No changes recommended at this time.     

## 2018-03-06 NOTE — Assessment & Plan Note (Signed)
BP well controlled Current regimen effective and well tolerated Continue current medications at current doses  

## 2018-03-06 NOTE — Assessment & Plan Note (Signed)
Lab Results  Component Value Date   HGBA1C 5.7 01/09/2018    Mildly elevated sugars Continue healthy diet and regular exercise

## 2018-03-06 NOTE — Assessment & Plan Note (Signed)
Asymptomatic Following with cardiology Continue current medications Continue regular exercise

## 2018-03-15 ENCOUNTER — Ambulatory Visit (INDEPENDENT_AMBULATORY_CARE_PROVIDER_SITE_OTHER): Payer: Medicare Other | Admitting: *Deleted

## 2018-03-15 DIAGNOSIS — I480 Paroxysmal atrial fibrillation: Secondary | ICD-10-CM

## 2018-03-15 DIAGNOSIS — I495 Sick sinus syndrome: Secondary | ICD-10-CM | POA: Diagnosis not present

## 2018-03-15 NOTE — Progress Notes (Signed)
Remote pacemaker transmission.   

## 2018-03-29 ENCOUNTER — Other Ambulatory Visit: Payer: Self-pay | Admitting: Internal Medicine

## 2018-04-05 ENCOUNTER — Ambulatory Visit (HOSPITAL_COMMUNITY)
Admission: RE | Admit: 2018-04-05 | Payer: Medicare Other | Source: Ambulatory Visit | Attending: Interventional Cardiology | Admitting: Interventional Cardiology

## 2018-04-07 ENCOUNTER — Telehealth: Payer: Self-pay | Admitting: Internal Medicine

## 2018-04-07 NOTE — Telephone Encounter (Signed)
Copied from [place] [id number]. Topic: General - Other >> Apr 07, 2018 12:19 PM Mcneil, Ja-Kwan wrote: Reason for CRM: Pramod with [facility] request verbal orders for physical therapy for 2 times a week for 4 weeks, occupational therapy evaluation for 1 time a week for 1 week, speech therapy evaluation for 1 time a week for 1 week. Cb# [phone]

## 2018-04-10 NOTE — Telephone Encounter (Signed)
Spoke with Pramod to give verbal orders per MD

## 2018-04-11 ENCOUNTER — Inpatient Hospital Stay: Payer: Medicare Other | Admitting: Internal Medicine

## 2018-04-11 DIAGNOSIS — Z0289 Encounter for other administrative examinations: Secondary | ICD-10-CM

## 2018-04-11 NOTE — Progress Notes (Deleted)
Subjective:    Patient ID: RAYVEN HENDRICKSON, male    DOB: Jul 12, 1931, 84 y.o.   MRN: [id number]  HPI The patient is here for follow up from the hospital.  He had a mechanical fall at home and went to the ED.    Medications and allergies reviewed with patient and updated if appropriate.  Patient Active Problem List   Diagnosis Date Noted  . Memory difficulties 01/09/2018  . Urgency incontinence 01/09/2018  . Ulnar neuropathy of right upper extremity 12/19/2017  . Anxiety 12/19/2017  . Acute pain of right knee 10/13/2017  . Sleep difficulties 09/21/2017  . Acute left ankle pain 09/21/2017  . Subdural hemorrhage ([place]) 05/09/2017  . Subarachnoid hemorrhage ([place]) 05/09/2017  . Prediabetes 04/06/2017  . Rash 04/06/2017  . Cervical radiculopathy at C8 10/08/2016  . Paroxysmal atrial fibrillation (HCC)   . Expressive aphasia 08/18/2016  . Essential hypertension, benign 08/17/2016  . Numbness and tingling of right arm 08/17/2016  . AAA (abdominal aortic aneurysm) without rupture ([place]) 04/25/2016  . Pulmonary nodule 04/25/2016  . Bilateral hearing loss 03/22/2016  . Aneurysm, cerebral, nonruptured 03/11/2016  . Carotid artery stenosis 03/11/2016  . BPPV (benign paroxysmal positional vertigo) 03/10/2016  . Ataxia   . Burning tongue 08/12/2015  . GERD (gastroesophageal reflux disease) 08/12/2015  . BPH without urinary obstruction 04/10/2015  . 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 Chronic headache   . Osteoarthritis   . Cardiac pacemaker in situ 10/18/2011  . CAD (coronary artery disease), autologous vein bypass graft 09/13/2011  . Sick sinus syndrome ([place]) 09/13/2011  . Hyperlipidemia with target LDL less than 70 04/16/2008  . Acute myocardial infarction ([place]) 04/16/2008  . Pulmonary fibrosis ([place]) 04/16/2008    Current Outpatient Medications on File Prior to Visit  Medication Sig Dispense Refill  . acetaminophen (TYLENOL) 500 MG tablet Take 1 tablet (500 mg total) by  mouth daily as needed for headache. 14 tablet 0  . CARTIA XT 120 MG 24 hr capsule TAKE 1 CAPSULE DAILY 90 capsule 3  . mupirocin ointment (BACTROBAN) 2 % Apply to affected area TID for 7 days. 30 g 3  . nitroGLYCERIN (NITROSTAT) 0.4 MG SL tablet Place 0.4 mg under the tongue every 5 (five) minutes as needed for chest pain.    Marland Kitchen PARoxetine (PAXIL) 10 MG tablet Take 1 tablet (10 mg total) by mouth daily. 30 tablet 0  . ranolazine (RANEXA) 500 MG 12 hr tablet Take 1 tablet (500 mg total) by mouth 2 (two) times daily. 180 tablet 3  . simvastatin (ZOCOR) 20 MG tablet TAKE 1 TABLET EVERY EVENING 90 tablet 1  . tamsulosin (FLOMAX) 0.4 MG CAPS capsule TAKE 1 CAPSULE DAILY 90 capsule 2   No current facility-administered medications on file prior to visit.     Past Medical History:  Diagnosis Date  . AAA (abdominal aortic aneurysm) (HCC)    3.1 cm AAA, reevaluated 08/2009 per ultrasound - stable  . Allergic rhinitis   . Aortic sclerosis    Probable AS on physical exam, 2010  . Aortic stenosis    mild AS 2014 echo  . Arthritis   . Atrial fibrillation (HCC)    chronic anticoag  . Atrial flutter ([place])   . Basal cell carcinoma   . Benign positional vertigo   . BPH (benign prostatic hyperplasia)   . CAD in native artery    a) s/p CABG '98; LIMA-LAD, SVG-D1, SVG-OM1, SVG-PDA); b) CATH -2009: midLAD & RCA 100%,  OM2 100% wtih severe native Cx; Grafts patent with ~30-50% SVG-D1 & ~30-40% SVG-OM, EF 45%; c) Lexiscan Cardiolite 02/2014: EF 61%, No Ischemia; subtle fixed anteroseptal defect.  . Cardiac pacemaker in [place]  . Coronary atherosclerosis of native coronary artery   . Diverticulosis of colon (without mention of hemorrhage)   . DJD (degenerative joint disease)   . Esophageal reflux   . Essential hypertension, benign   . GERD (gastroesophageal reflux disease)   . Headache    after brain aneurysm  . History of tuberculosis    remote Hx  . Inflamed seborrheic keratosis     . Intermittent confusion   . Internal hemorrhoids    severe  . Mixed hyperlipidemia   . Mouth burn   . Myocardial infarction ([place])   . Near syncope    uncertain cause. R/O arrhythmia, R/O med effect  . Osteoarthritis 06/14/2012  . Peptic ulcer, unspecified site, unspecified as acute or chronic, without mention of hemorrhage, perforation, or obstruction   . Personal history of colonic polyps   . Pneumonia   . Presbycusis of both ears    Bilateral hearing aids  . Presence of permanent cardiac pacemaker   . Prostatitis dx 12/2102   E coli Ucx  . SSS (sick sinus syndrome) (HCC)    syncope, s/p ppm 12/12  . Systolic murmur    Worrisome for AS. AS could cause exertional fatigue.    Past Surgical History:  Procedure Laterality Date  . CARDIAC CATHETERIZATION  2002  . cataracts     bilateral  . COLONOSCOPY    . CORONARY ARTERY BYPASS GRAFT  1997   (LIMA to LAD, SVG to diagonal-50% closed on catheterization in 1999, SVG to OM1, SVG to PDA) Repeat cath 2009 with patent grafts  . EXCISION OF TONGUE LESION    . EYE SURGERY Bilateral    cataract surgery with lens implant  . HEMORRHOID SURGERY  07/31/2010  . INSERT / REPLACE / REMOVE PACEMAKER  08/27/2011   PPM implant  . IR GENERIC HISTORICAL  04/06/2016   IR ANGIO VERTEBRAL SEL SUBCLAVIAN INNOMINATE UNI L MOD SED 04/06/2016 Luanne Bras, MD [facility]  . IR GENERIC HISTORICAL  04/06/2016   IR ANGIO VERTEBRAL SEL VERTEBRAL UNI R MOD SED 04/06/2016 Luanne Bras, MD [facility]  . IR GENERIC HISTORICAL  04/06/2016   IR ANGIO INTRA EXTRACRAN SEL COM CAROTID INNOMINATE BILAT MOD SED 04/06/2016 Luanne Bras, MD [facility]  . IR GENERIC HISTORICAL  05/19/2016   IR ANGIO INTRA EXTRACRAN SEL INTERNAL CAROTID UNI L MOD SED 05/19/2016 Luanne Bras, MD [facility]  . IR GENERIC HISTORICAL  05/19/2016   IR NEURO EACH ADD'L AFTER BASIC UNI LEFT (MS) 05/19/2016 Luanne Bras, MD [facility]  . IR GENERIC HISTORICAL  05/19/2016    IR ANGIOGRAM FOLLOW UP STUDY 05/19/2016 Luanne Bras, MD [facility]  . IR GENERIC HISTORICAL  05/19/2016   IR ANGIO VERTEBRAL SEL VERTEBRAL UNI L MOD SED 05/19/2016 Luanne Bras, MD [facility]  . IR GENERIC HISTORICAL  05/19/2016   IR TRANSCATH/EMBOLIZ 05/19/2016 Luanne Bras, MD [facility]  . IR GENERIC HISTORICAL  05/19/2016   IR 3D INDEPENDENT WKST 05/19/2016 Luanne Bras, MD [facility]  . IR GENERIC HISTORICAL  06/03/2016   IR RADIOLOGIST EVAL & MGMT 06/03/2016 [facility]  . IR GENERIC HISTORICAL  07/20/2016   IR RADIOLOGIST EVAL & MGMT 07/20/2016 [facility]  . left rotator cuff surgery  2000  .  PERMANENT PACEMAKER INSERTION N/A 08/27/2011   Procedure: PERMANENT PACEMAKER INSERTION;  Surgeon: Evans Lance, MD;  Location: [facility] CATH LAB;  Service: Cardiovascular;  Laterality: N/A;  . PUNCH BIOPSY OF SKIN  08/2009   5 mm punch biopsy on upper mid back melanotic appearing lesion  . RADIOLOGY WITH ANESTHESIA N/A 05/19/2016   Procedure: Embolization;  Surgeon: Luanne Bras, MD;  Location: [place];  Service: Radiology;  Laterality: N/A;    Social History   Socioeconomic History  . Marital status: Married    Spouse name: Not on file  . Number of children: 4  . Years of education: Not on file  . Highest education level: Not on file  Occupational History  . Occupation: retired    Comment: retired Art gallery manager  Social Needs  . Financial resource strain: Not on file  . Food insecurity:    Worry: Not on file    Inability: Not on file  . Transportation needs:    Medical: Not on file    Non-medical: Not on file  Tobacco Use  . Smoking status: Former Smoker    Packs/day: 0.50    Years: 15.00    Pack years: 7.50    Types: Cigarettes    Last attempt to quit: 09/13/1965    Years since quitting: 52.6  . Smokeless tobacco: Never Used  Substance and Sexual Activity  . Alcohol use: No    Alcohol/week: 0.0 oz  . Drug use: No  . Sexual activity: Not  Currently  Lifestyle  . Physical activity:    Days per week: Not on file    Minutes per session: Not on file  . Stress: Not on file  Relationships  . Social connections:    Talks on phone: Not on file    Gets together: Not on file    Attends religious service: Not on file    Active member of club or organization: Not on file    Attends meetings of clubs or organizations: Not on file    Relationship status: Not on file  Other Topics Concern  . Not on file  Social History Narrative   Moved from [place] in [place] alone; lives in 2 story home but stays downstairs   3 children / 1 other;     Family History  Problem Relation Age of Onset  . Other Father 97       [place]  . Heart attack Mother 23  . Heart disease Mother        ASHD  . COPD Brother   . Hypertension Brother   . Heart disease Brother        ASHD  . Heart disease Brother        ASHD  . Heart disease Sister        ASHD  . COPD Brother   . Coronary artery disease Unknown     Review of Systems     Objective:  There were no vitals filed for this visit. BP Readings from Last 3 Encounters:  03/06/18 124/80  02/24/18 118/64  02/07/18 130/82   Wt Readings from Last 3 Encounters:  03/06/18 128 lb (58.1 kg)  02/24/18 128 lb (58.1 kg)  02/07/18 129 lb (58.5 kg)   There is no height or weight on file to calculate BMI.   Physical Exam    Constitutional: Appears well-developed and well-nourished. No distress.  HENT:  Head: Normocephalic and atraumatic.  Neck: Neck supple. No tracheal deviation present.  No thyromegaly present.  No cervical lymphadenopathy Cardiovascular: Normal rate, regular rhythm and normal heart sounds.   No murmur heard. No carotid bruit .  No edema Pulmonary/Chest: Effort normal and breath sounds normal. No respiratory distress. No has no wheezes. No rales.  Skin: Skin is warm and dry. Not diaphoretic.  Psychiatric: Normal mood and affect. Behavior is normal.      Assessment  & Plan:    See Problem List for Assessment and Plan of chronic medical problems.

## 2018-04-12 LAB — CUP PACEART REMOTE DEVICE CHECK
Implantable Lead Implant Date: 20121214
Implantable Lead Implant Date: 20121214
Implantable Lead Location: 753860
Implantable Lead Serial Number: 29098858
Implantable Pulse Generator Implant Date: 20121214
Lead Channel Impedance Value: 833 Ohm
Lead Channel Pacing Threshold Amplitude: 0.6 V
Lead Channel Pacing Threshold Pulse Width: 0.4 ms
Lead Channel Pacing Threshold Pulse Width: 0.4 ms
Lead Channel Setting Sensing Sensitivity: 2.5 mV
MDC IDC LEAD LOCATION: 753859
MDC IDC LEAD SERIAL: 29066260
MDC IDC MSMT BATTERY REMAINING LONGEVITY: 48 mo
MDC IDC MSMT BATTERY REMAINING PERCENTAGE: 73 %
MDC IDC MSMT LEADCHNL RA IMPEDANCE VALUE: 560 Ohm
MDC IDC MSMT LEADCHNL RA PACING THRESHOLD AMPLITUDE: 1 V
MDC IDC SESS DTM: 20190703111500
MDC IDC SET LEADCHNL RA PACING AMPLITUDE: 2 V
MDC IDC SET LEADCHNL RV PACING AMPLITUDE: 1.1 V
MDC IDC SET LEADCHNL RV PACING PULSEWIDTH: 0.4 ms
MDC IDC STAT BRADY RA PERCENT PACED: 7 %
MDC IDC STAT BRADY RV PERCENT PACED: 6 %
Pulse Gen Serial Number: 118262

## 2018-04-18 ENCOUNTER — Ambulatory Visit: Payer: Medicare Other | Admitting: Internal Medicine

## 2018-04-18 ENCOUNTER — Encounter: Payer: Self-pay | Admitting: Internal Medicine

## 2018-04-18 VITALS — BP 124/70 | HR 60 | Temp 98.7°F | Resp 16 | Wt 128.0 lb

## 2018-04-18 DIAGNOSIS — Y92009 Unspecified place in unspecified non-institutional (private) residence as the place of occurrence of the external cause: Secondary | ICD-10-CM | POA: Insufficient documentation

## 2018-04-18 DIAGNOSIS — W19XXXA Unspecified fall, initial encounter: Secondary | ICD-10-CM | POA: Insufficient documentation

## 2018-04-18 DIAGNOSIS — I10 Essential (primary) hypertension: Secondary | ICD-10-CM

## 2018-04-18 DIAGNOSIS — R42 Dizziness and giddiness: Secondary | ICD-10-CM | POA: Diagnosis not present

## 2018-04-18 DIAGNOSIS — S0292XA Unspecified fracture of facial bones, initial encounter for closed fracture: Secondary | ICD-10-CM | POA: Insufficient documentation

## 2018-04-18 DIAGNOSIS — N400 Enlarged prostate without lower urinary tract symptoms: Secondary | ICD-10-CM

## 2018-04-18 DIAGNOSIS — N3941 Urge incontinence: Secondary | ICD-10-CM | POA: Diagnosis not present

## 2018-04-18 DIAGNOSIS — W19XXXD Unspecified fall, subsequent encounter: Secondary | ICD-10-CM

## 2018-04-18 DIAGNOSIS — S0292XD Unspecified fracture of facial bones, subsequent encounter for fracture with routine healing: Secondary | ICD-10-CM

## 2018-04-18 NOTE — Assessment & Plan Note (Signed)
Recent fall at home versus syncopal episode Sustained multiple facial fractures -routine healing Will refer to cardiology for evaluation of possible syncope

## 2018-04-18 NOTE — Assessment & Plan Note (Signed)
Cartia XT 120 mg is still on his medication list, but he states he is not currently taking that Encouraged increased fluid intake Follow-up with cardiology in the event that this was a syncopal episode Advised him to bring in his medication to his appointments

## 2018-04-18 NOTE — Assessment & Plan Note (Signed)
Referred to urology

## 2018-04-18 NOTE — Assessment & Plan Note (Signed)
Taking Flomax daily Denies dysuria, hematuria or difficulty urinating He is experiencing nocturia and incontinence at nighttime only Will refer back to urology for further evaluation

## 2018-04-18 NOTE — Progress Notes (Signed)
Subjective:    Patient ID: Shaun James, male    DOB: 09-07-1931, 84 y.o.   MRN: [id number]  HPI The patient is here for follow up from the hospital.  He is here with his wife.  He had a mechanical fall at home and went to the ED he states he does not recall falling.  He is unsure what happened 7/21..  The fall was not witnessed.  He sustained a closed fracture of the zygomaticmaxillary complex on the right, closed fracture of the right orbital floor and closed flexure of the right wrist.  He has followed up with plastic surgery and ophthalmology and no treatment is needed.  His complaints today are dizziness, weakness and excessive urination with incontinence at nighttime only.  He denies increased frequency of urination during the day.   Dizziness: He states dizziness when he stands up.  It does not occur every time he stands up, but it does occur daily.  He denies dizziness with sitting.  He denies dizziness with walking.  He is states he is currently not taking any blood pressure medication.  His pacemaker was interrogated in the emergency room and there was no events.  Generalized weakness he states he sleeps well.:  He feels very weak.  He has PT coming in.  He is eating well.  He is not doing much exercise outside of physical therapy.  Increased urination at night only.  He does not have increased urination during the day.  He did see a urologist and he states they just took a urine sample.  They did not do any thing else.  The note from urology states that he left prior to being seen, but he states that he was told that he could go home.  He is taking Flomax daily.  He denies any painful urination or blood in the urine.     Medications and allergies reviewed with patient and updated if appropriate.  Patient Active Problem List   Diagnosis Date Noted  . Memory difficulties 01/09/2018  . Urgency incontinence 01/09/2018  . Ulnar neuropathy of right upper extremity 12/19/2017  .  Anxiety 12/19/2017  . Acute pain of right knee 10/13/2017  . Sleep difficulties 09/21/2017  . Acute left ankle pain 09/21/2017  . Subdural hemorrhage ([place]) 05/09/2017  . Subarachnoid hemorrhage ([place]) 05/09/2017  . Prediabetes 04/06/2017  . Rash 04/06/2017  . Cervical radiculopathy at C8 10/08/2016  . Paroxysmal atrial fibrillation (HCC)   . Expressive aphasia 08/18/2016  . Essential hypertension, benign 08/17/2016  . Numbness and tingling of right arm 08/17/2016  . AAA (abdominal aortic aneurysm) without rupture ([place]) 04/25/2016  . Pulmonary nodule 04/25/2016  . Bilateral hearing loss 03/22/2016  . Aneurysm, cerebral, nonruptured 03/11/2016  . Carotid artery stenosis 03/11/2016  . BPPV (benign paroxysmal positional vertigo) 03/10/2016  . Ataxia   . Burning tongue 08/12/2015  . GERD (gastroesophageal reflux disease) 08/12/2015  . BPH without urinary obstruction 04/10/2015  . 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 Chronic headache   . Osteoarthritis   . Cardiac pacemaker in situ 10/18/2011  . CAD (coronary artery disease), autologous vein bypass graft 09/13/2011  . Sick sinus syndrome ([place]) 09/13/2011  . Hyperlipidemia with target LDL less than 70 04/16/2008  . Acute myocardial infarction ([place]) 04/16/2008  . Pulmonary fibrosis ([place]) 04/16/2008    Current Outpatient Medications on File Prior to Visit  Medication Sig Dispense Refill  . acetaminophen (TYLENOL) 500 MG tablet Take 1 tablet (500 mg  total) by mouth daily as needed for headache. 14 tablet 0  . CARTIA XT 120 MG 24 hr capsule TAKE 1 CAPSULE DAILY 90 capsule 3  . mupirocin ointment (BACTROBAN) 2 % Apply to affected area TID for 7 days. 30 g 3  . nitroGLYCERIN (NITROSTAT) 0.4 MG SL tablet Place 0.4 mg under the tongue every 5 (five) minutes as needed for chest pain.    Marland Kitchen PARoxetine (PAXIL) 10 MG tablet Take 1 tablet (10 mg total) by mouth daily. 30 tablet 0  . ranolazine (RANEXA) 500 MG 12 hr tablet Take 1 tablet  (500 mg total) by mouth 2 (two) times daily. 180 tablet 3  . simvastatin (ZOCOR) 20 MG tablet TAKE 1 TABLET EVERY EVENING 90 tablet 1  . tamsulosin (FLOMAX) 0.4 MG CAPS capsule TAKE 1 CAPSULE DAILY 90 capsule 2   No current facility-administered medications on file prior to visit.     Past Medical History:  Diagnosis Date  . AAA (abdominal aortic aneurysm) (HCC)    3.1 cm AAA, reevaluated 08/2009 per ultrasound - stable  . Allergic rhinitis   . Aortic sclerosis    Probable AS on physical exam, 2010  . Aortic stenosis    mild AS 2014 echo  . Arthritis   . Atrial fibrillation (HCC)    chronic anticoag  . Atrial flutter ([place])   . Basal cell carcinoma   . Benign positional vertigo   . BPH (benign prostatic hyperplasia)   . CAD in native artery    a) s/p CABG '98; LIMA-LAD, SVG-D1, SVG-OM1, SVG-PDA); b) CATH -2009: midLAD & RCA 100%, OM2 100% wtih severe native Cx; Grafts patent with ~30-50% SVG-D1 & ~30-40% SVG-OM, EF 45%; c) Lexiscan Cardiolite 02/2014: EF 61%, No Ischemia; subtle fixed anteroseptal defect.  . Cardiac pacemaker in [place]  . Coronary atherosclerosis of native coronary artery   . Diverticulosis of colon (without mention of hemorrhage)   . DJD (degenerative joint disease)   . Esophageal reflux   . Essential hypertension, benign   . GERD (gastroesophageal reflux disease)   . Headache    after brain aneurysm  . History of tuberculosis    remote Hx  . Inflamed seborrheic keratosis   . Intermittent confusion   . Internal hemorrhoids    severe  . Mixed hyperlipidemia   . Mouth burn   . Myocardial infarction ([place])   . Near syncope    uncertain cause. R/O arrhythmia, R/O med effect  . Osteoarthritis 06/14/2012  . Peptic ulcer, unspecified site, unspecified as acute or chronic, without mention of hemorrhage, perforation, or obstruction   . Personal history of colonic polyps   . Pneumonia   . Presbycusis of both ears    Bilateral hearing aids  .  Presence of permanent cardiac pacemaker   . Prostatitis dx 12/2102   E coli Ucx  . SSS (sick sinus syndrome) (HCC)    syncope, s/p ppm 12/12  . Systolic murmur    Worrisome for AS. AS could cause exertional fatigue.    Past Surgical History:  Procedure Laterality Date  . CARDIAC CATHETERIZATION  2002  . cataracts     bilateral  . COLONOSCOPY    . CORONARY ARTERY BYPASS GRAFT  1997   (LIMA to LAD, SVG to diagonal-50% closed on catheterization in 1999, SVG to OM1, SVG to PDA) Repeat cath 2009 with patent grafts  . EXCISION OF TONGUE LESION    . EYE SURGERY Bilateral  cataract surgery with lens implant  . HEMORRHOID SURGERY  07/31/2010  . INSERT / REPLACE / REMOVE PACEMAKER  08/27/2011   PPM implant  . IR GENERIC HISTORICAL  04/06/2016   IR ANGIO VERTEBRAL SEL SUBCLAVIAN INNOMINATE UNI L MOD SED 04/06/2016 Luanne Bras, MD [facility]  . IR GENERIC HISTORICAL  04/06/2016   IR ANGIO VERTEBRAL SEL VERTEBRAL UNI R MOD SED 04/06/2016 Luanne Bras, MD [facility]  . IR GENERIC HISTORICAL  04/06/2016   IR ANGIO INTRA EXTRACRAN SEL COM CAROTID INNOMINATE BILAT MOD SED 04/06/2016 Luanne Bras, MD [facility]  . IR GENERIC HISTORICAL  05/19/2016   IR ANGIO INTRA EXTRACRAN SEL INTERNAL CAROTID UNI L MOD SED 05/19/2016 Luanne Bras, MD [facility]  . IR GENERIC HISTORICAL  05/19/2016   IR NEURO EACH ADD'L AFTER BASIC UNI LEFT (MS) 05/19/2016 Luanne Bras, MD [facility]  . IR GENERIC HISTORICAL  05/19/2016   IR ANGIOGRAM FOLLOW UP STUDY 05/19/2016 Luanne Bras, MD [facility]  . IR GENERIC HISTORICAL  05/19/2016   IR ANGIO VERTEBRAL SEL VERTEBRAL UNI L MOD SED 05/19/2016 Luanne Bras, MD [facility]  . IR GENERIC HISTORICAL  05/19/2016   IR TRANSCATH/EMBOLIZ 05/19/2016 Luanne Bras, MD [facility]  . IR GENERIC HISTORICAL  05/19/2016   IR 3D INDEPENDENT WKST 05/19/2016 Luanne Bras, MD [facility]  . IR GENERIC HISTORICAL  06/03/2016   IR RADIOLOGIST EVAL  & MGMT 06/03/2016 [facility]  . IR GENERIC HISTORICAL  07/20/2016   IR RADIOLOGIST EVAL & MGMT 07/20/2016 [facility]  . left rotator cuff surgery  2000  . PERMANENT PACEMAKER INSERTION N/A 08/27/2011   Procedure: PERMANENT PACEMAKER INSERTION;  Surgeon: Evans Lance, MD;  Location: [facility] CATH LAB;  Service: Cardiovascular;  Laterality: N/A;  . PUNCH BIOPSY OF SKIN  08/2009   5 mm punch biopsy on upper mid back melanotic appearing lesion  . RADIOLOGY WITH ANESTHESIA N/A 05/19/2016   Procedure: Embolization;  Surgeon: Luanne Bras, MD;  Location: [facility];  Service: Radiology;  Laterality: N/A;    Social History   Socioeconomic History  . Marital status: Married    Spouse name: Not on file  . Number of children: 4  . Years of education: Not on file  . Highest education level: Not on file  Occupational History  . Occupation: retired    Comment: retired Art gallery manager  Social Needs  . Financial resource strain: Not on file  . Food insecurity:    Worry: Not on file    Inability: Not on file  . Transportation needs:    Medical: Not on file    Non-medical: Not on file  Tobacco Use  . Smoking status: Former Smoker    Packs/day: 0.50    Years: 15.00    Pack years: 7.50    Types: Cigarettes    Last attempt to quit: 09/13/1965    Years since quitting: 52.6  . Smokeless tobacco: Never Used  Substance and Sexual Activity  . Alcohol use: No    Alcohol/week: 0.0 oz  . Drug use: No  . Sexual activity: Not Currently  Lifestyle  . Physical activity:    Days per week: Not on file    Minutes per session: Not on file  . Stress: Not on file  Relationships  . Social connections:    Talks on phone: Not on file    Gets together: Not on file    Attends religious service: Not on file    Active member  of club or organization: Not on file    Attends meetings of clubs or organizations: Not on file    Relationship status: Not on file  Other Topics Concern  . Not on file  Social  History Narrative   Moved from [place] in [place] alone; lives in 2 story home but stays downstairs   3 children / 1 other;     Family History  Problem Relation Age of Onset  . Other Father 48       [place]  . Heart attack Mother 51  . Heart disease Mother        ASHD  . COPD Brother   . Hypertension Brother   . Heart disease Brother        ASHD  . Heart disease Brother        ASHD  . Heart disease Sister        ASHD  . COPD Brother   . Coronary artery disease Unknown     Review of Systems  Constitutional: Negative for chills and fever.  Eyes: Negative for visual disturbance.  Respiratory: Negative for cough, shortness of breath and wheezing.   Cardiovascular: Negative for chest pain and palpitations.  Gastrointestinal: Negative for abdominal pain and nausea.  Neurological: Positive for dizziness. Negative for headaches.  Psychiatric/Behavioral: Negative for confusion and sleep disturbance.       Objective:   Vitals:   04/18/18 1602  BP: 124/70  Pulse: 60  Resp: 16  Temp: 98.7 F (37.1 C)  SpO2: 96%   BP Readings from Last 3 Encounters:  04/18/18 124/70  03/06/18 124/80  02/24/18 118/64   Wt Readings from Last 3 Encounters:  04/18/18 128 lb (58.1 kg)  03/06/18 128 lb (58.1 kg)  02/24/18 128 lb (58.1 kg)   Body mass index is 21.97 kg/m.   Physical Exam    Constitutional: Appears well-developed and well-nourished. No distress.  HENT:  Head: Normocephalic and atraumatic.  Neck: Neck supple. No tracheal deviation present. No thyromegaly present.  No cervical lymphadenopathy Cardiovascular: Normal rate, regular rhythm and normal heart sounds.   No murmur heard. No carotid bruit .  No edema Pulmonary/Chest: Effort normal and breath sounds normal. No respiratory distress. No has no wheezes. No rales.  Skin: Skin is warm and dry. Not diaphoretic.  Psychiatric: Normal mood and affect. Behavior is normal.      Assessment & Plan:    See Problem  List for Assessment and Plan of chronic medical problems.

## 2018-04-18 NOTE — Assessment & Plan Note (Signed)
Related to fall at home 7/21 zygomatic-maxillary complex on right, right orbital floor Saw ophthalmology and plastic surgery-no intervention needed Routine healing

## 2018-04-18 NOTE — Patient Instructions (Signed)
  Medications reviewed and updated.  No changes recommended at this time.   A referral was ordered for urology and cardiology.

## 2018-04-18 NOTE — Assessment & Plan Note (Addendum)
Experiencing dizziness with standing only-probable hypotension He states he is not taking any blood pressure medication at this time-advised him to bring in his medication when he comes Increase fluid intake Has physical therapy coming to home Advised precautions when standing and walking Will refer to cardiology to rule out other cardiac causes

## 2018-04-21 ENCOUNTER — Telehealth: Payer: Self-pay | Admitting: Emergency Medicine

## 2018-04-21 NOTE — Telephone Encounter (Signed)
Copied from [place] [id number]. Topic: Quick Communication - See Telephone Encounter >> Apr 21, 2018  9:38 AM Antonieta Iba C wrote: CRM for notification. See Telephone encounter for: 04/21/18.  Pt saw PCP and would like a referral to Ortho.   CB: [phone]

## 2018-04-23 NOTE — Telephone Encounter (Signed)
What is referral for

## 2018-04-25 NOTE — Telephone Encounter (Signed)
Spoke with pt and was advised that he already made an appointment with orthopedics and had an appointment today. States it was due to right shoulder pain.

## 2018-04-27 ENCOUNTER — Encounter: Payer: Self-pay | Admitting: Cardiology

## 2018-04-28 ENCOUNTER — Telehealth: Payer: Self-pay | Admitting: Internal Medicine

## 2018-04-28 ENCOUNTER — Encounter: Payer: Self-pay | Admitting: Internal Medicine

## 2018-04-28 ENCOUNTER — Ambulatory Visit: Payer: Medicare Other | Admitting: Internal Medicine

## 2018-04-28 ENCOUNTER — Ambulatory Visit (INDEPENDENT_AMBULATORY_CARE_PROVIDER_SITE_OTHER)
Admission: RE | Admit: 2018-04-28 | Discharge: 2018-04-28 | Disposition: A | Discharge status: Home / Self Care | Payer: Medicare Other | Source: Ambulatory Visit | Attending: Internal Medicine | Admitting: Internal Medicine

## 2018-04-28 VITALS — BP 130/72 | HR 67 | Temp 97.6°F | Resp 18 | Wt 132.0 lb

## 2018-04-28 DIAGNOSIS — R296 Repeated falls: Secondary | ICD-10-CM | POA: Diagnosis not present

## 2018-04-28 DIAGNOSIS — S0993XA Unspecified injury of face, initial encounter: Secondary | ICD-10-CM

## 2018-04-28 MED ORDER — OMEPRAZOLE 20 MG PO CPDR: 20.0000 mg | DELAYED_RELEASE_CAPSULE | Freq: Every day | ORAL | 3 refills | Status: DC

## 2018-04-28 NOTE — Progress Notes (Signed)
Subjective:    Patient ID: Shaun James, male    DOB: [birth year], 84 y.o.   MRN: [id number]  HPI The patient is here for an acute visit.   Fall:  He fell yesterday.   He is having frequent falls.  He does not remember falling.  His wife was there - he was urinating and just fell to the right and hit his face, left cheek, on the wall.   It is sore.  It bled initially but not since then.  he denies other injuries.  There was no LOC.  His last fall in July results in multiple facial fracture, which are still healing.    He feels dizzy a lot.  He does not feel the dizziness causes the fall.  He is unsure why is falling.  He denies chest pain, sob, lightheadedness/dizziness or palpitations prior to or after falling.   Hiccups:  He started hiccuping three days ago.  He hiccups during the day and night.  He denies cough, sob, wheeze, fevers and heartburn.  He did take a stomach medication and it did seem to help.      Medications and allergies reviewed with patient and updated if appropriate.  Patient Active Problem List   Diagnosis Date Noted  . Dizziness 04/18/2018  . Fall at home 04/18/2018  . Multiple facial fractures ([place]) 04/18/2018  . Memory difficulties 01/09/2018  . Urgency incontinence 01/09/2018  . Ulnar neuropathy of right upper extremity 12/19/2017  . Anxiety 12/19/2017  . Acute pain of right knee 10/13/2017  . Sleep difficulties 09/21/2017  . Acute left ankle pain 09/21/2017  . Subdural hemorrhage ([place]) 05/09/2017  . Subarachnoid hemorrhage ([place]) 05/09/2017  . Prediabetes 04/06/2017  . Rash 04/06/2017  . Cervical radiculopathy at C8 10/08/2016  . Paroxysmal atrial fibrillation (HCC)   . Expressive aphasia 08/18/2016  . Essential hypertension, benign 08/17/2016  . Numbness and tingling of right arm 08/17/2016  . AAA (abdominal aortic aneurysm) without rupture ([place]) 04/25/2016  . Pulmonary nodule 04/25/2016  . Bilateral hearing loss 03/22/2016  . Aneurysm, cerebral,  nonruptured 03/11/2016  . Carotid artery stenosis 03/11/2016  . BPPV (benign paroxysmal positional vertigo) 03/10/2016  . Ataxia   . Burning tongue 08/12/2015  . GERD (gastroesophageal reflux disease) 08/12/2015  . BPH without urinary obstruction 04/10/2015  . 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 Chronic headache   . Osteoarthritis   . Cardiac pacemaker in situ 10/18/2011  . CAD (coronary artery disease), autologous vein bypass graft 09/13/2011  . Sick sinus syndrome ([place]) 09/13/2011  . Hyperlipidemia with target LDL less than 70 04/16/2008  . Acute myocardial infarction ([place]) 04/16/2008  . Pulmonary fibrosis ([place]) 04/16/2008    Current Outpatient Medications on File Prior to Visit  Medication Sig Dispense Refill  . acetaminophen (TYLENOL) 500 MG tablet Take 1 tablet (500 mg total) by mouth daily as needed for headache. 14 tablet 0  . mupirocin ointment (BACTROBAN) 2 % Apply to affected area TID for 7 days. 30 g 3  . nitroGLYCERIN (NITROSTAT) 0.4 MG SL tablet Place 0.4 mg under the tongue every 5 (five) minutes as needed for chest pain.    Marland Kitchen PARoxetine (PAXIL) 10 MG tablet Take 1 tablet (10 mg total) by mouth daily. 30 tablet 0  . ranolazine (RANEXA) 500 MG 12 hr tablet Take 1 tablet (500 mg total) by mouth 2 (two) times daily. 180 tablet 3  . simvastatin (ZOCOR) 20 MG tablet TAKE 1 TABLET EVERY EVENING 90  tablet 1  . tamsulosin (FLOMAX) 0.4 MG CAPS capsule TAKE 1 CAPSULE DAILY 90 capsule 2   No current facility-administered medications on file prior to visit.     Past Medical History:  Diagnosis Date  . AAA (abdominal aortic aneurysm) (HCC)    3.1 cm AAA, reevaluated 08/2009 per ultrasound - stable  . Allergic rhinitis   . Aortic sclerosis    Probable AS on physical exam, 2010  . Aortic stenosis    mild AS 2014 echo  . Arthritis   . Atrial fibrillation (HCC)    chronic anticoag  . Atrial flutter ([place])   . Basal cell carcinoma   . Benign positional vertigo   .  BPH (benign prostatic hyperplasia)   . CAD in native artery    a) s/p CABG '98; LIMA-LAD, SVG-D1, SVG-OM1, SVG-PDA); b) CATH -2009: midLAD & RCA 100%, OM2 100% wtih severe native Cx; Grafts patent with ~30-50% SVG-D1 & ~30-40% SVG-OM, EF 45%; c) Lexiscan Cardiolite 02/2014: EF 61%, No Ischemia; subtle fixed anteroseptal defect.  . Cardiac pacemaker in [place]  . Coronary atherosclerosis of native coronary artery   . Diverticulosis of colon (without mention of hemorrhage)   . DJD (degenerative joint disease)   . Esophageal reflux   . Essential hypertension, benign   . GERD (gastroesophageal reflux disease)   . Headache    after brain aneurysm  . History of tuberculosis    remote Hx  . Inflamed seborrheic keratosis   . Intermittent confusion   . Internal hemorrhoids    severe  . Mixed hyperlipidemia   . Mouth burn   . Myocardial infarction ([place])   . Near syncope    uncertain cause. R/O arrhythmia, R/O med effect  . Osteoarthritis 06/14/2012  . Peptic ulcer, unspecified site, unspecified as acute or chronic, without mention of hemorrhage, perforation, or obstruction   . Personal history of colonic polyps   . Pneumonia   . Presbycusis of both ears    Bilateral hearing aids  . Presence of permanent cardiac pacemaker   . Prostatitis dx 12/2102   E coli Ucx  . SSS (sick sinus syndrome) (HCC)    syncope, s/p ppm 12/12  . Systolic murmur    Worrisome for AS. AS could cause exertional fatigue.    Past Surgical History:  Procedure Laterality Date  . CARDIAC CATHETERIZATION  2002  . cataracts     bilateral  . COLONOSCOPY    . CORONARY ARTERY BYPASS GRAFT  1997   (LIMA to LAD, SVG to diagonal-50% closed on catheterization in 1999, SVG to OM1, SVG to PDA) Repeat cath 2009 with patent grafts  . EXCISION OF TONGUE LESION    . EYE SURGERY Bilateral    cataract surgery with lens implant  . HEMORRHOID SURGERY  07/31/2010  . INSERT / REPLACE / REMOVE PACEMAKER   08/27/2011   PPM implant  . IR GENERIC HISTORICAL  04/06/2016   IR ANGIO VERTEBRAL SEL SUBCLAVIAN INNOMINATE UNI L MOD SED 04/06/2016 Luanne Bras, MD [facility]  . IR GENERIC HISTORICAL  04/06/2016   IR ANGIO VERTEBRAL SEL VERTEBRAL UNI R MOD SED 04/06/2016 Luanne Bras, MD [facility]  . IR GENERIC HISTORICAL  04/06/2016   IR ANGIO INTRA EXTRACRAN SEL COM CAROTID INNOMINATE BILAT MOD SED 04/06/2016 Luanne Bras, MD [facility]  . IR GENERIC HISTORICAL  05/19/2016   IR ANGIO INTRA EXTRACRAN SEL INTERNAL CAROTID UNI L MOD SED 05/19/2016 Luanne Bras, MD [facility]  .  IR GENERIC HISTORICAL  05/19/2016   IR NEURO EACH ADD'L AFTER BASIC UNI LEFT (MS) 05/19/2016 Luanne Bras, MD [facility]  . IR GENERIC HISTORICAL  05/19/2016   IR ANGIOGRAM FOLLOW UP STUDY 05/19/2016 Luanne Bras, MD [facility]  . IR GENERIC HISTORICAL  05/19/2016   IR ANGIO VERTEBRAL SEL VERTEBRAL UNI L MOD SED 05/19/2016 Luanne Bras, MD [facility]  . IR GENERIC HISTORICAL  05/19/2016   IR TRANSCATH/EMBOLIZ 05/19/2016 Luanne Bras, MD [facility]  . IR GENERIC HISTORICAL  05/19/2016   IR 3D INDEPENDENT WKST 05/19/2016 Luanne Bras, MD [facility]  . IR GENERIC HISTORICAL  06/03/2016   IR RADIOLOGIST EVAL & MGMT 06/03/2016 [facility]  . IR GENERIC HISTORICAL  07/20/2016   IR RADIOLOGIST EVAL & MGMT 07/20/2016 [facility]  . left rotator cuff surgery  2000  . PERMANENT PACEMAKER INSERTION N/A 08/27/2011   Procedure: PERMANENT PACEMAKER INSERTION;  Surgeon: Evans Lance, MD;  Location: [facility] CATH LAB;  Service: Cardiovascular;  Laterality: N/A;  . PUNCH BIOPSY OF SKIN  08/2009   5 mm punch biopsy on upper mid back melanotic appearing lesion  . RADIOLOGY WITH ANESTHESIA N/A 05/19/2016   Procedure: Embolization;  Surgeon: Luanne Bras, MD;  Location: [facility];  Service: Radiology;  Laterality: N/A;    Social History   Socioeconomic History  . Marital status: Married    Spouse name:  Not on file  . Number of children: 4  . Years of education: Not on file  . Highest education level: Not on file  Occupational History  . Occupation: retired    Comment: retired Art gallery manager  Social Needs  . Financial resource strain: Not on file  . Food insecurity:    Worry: Not on file    Inability: Not on file  . Transportation needs:    Medical: Not on file    Non-medical: Not on file  Tobacco Use  . Smoking status: Former Smoker    Packs/day: 0.50    Years: 15.00    Pack years: 7.50    Types: Cigarettes    Last attempt to quit: 09/13/1965    Years since quitting: 52.6  . Smokeless tobacco: Never Used  Substance and Sexual Activity  . Alcohol use: No    Alcohol/week: 0.0 standard drinks  . Drug use: No  . Sexual activity: Not Currently  Lifestyle  . Physical activity:    Days per week: Not on file    Minutes per session: Not on file  . Stress: Not on file  Relationships  . Social connections:    Talks on phone: Not on file    Gets together: Not on file    Attends religious service: Not on file    Active member of club or organization: Not on file    Attends meetings of clubs or organizations: Not on file    Relationship status: Not on file  Other Topics Concern  . Not on file  Social History Narrative   Moved from [place] in [place] alone; lives in 2 story home but stays downstairs   3 children / 1 other;     Family History  Problem Relation Age of Onset  . Other Father 38       [place]  . Heart attack Mother 50  . Heart disease Mother        ASHD  . COPD Brother   . Hypertension Brother   . Heart disease Brother  ASHD  . Heart disease Brother        ASHD  . Heart disease Sister        ASHD  . COPD Brother   . Coronary artery disease Unknown     Review of Systems  Constitutional: Negative for fever.  Eyes: Negative for visual disturbance.  Respiratory: Negative for cough, shortness of breath and wheezing.   Cardiovascular:  Negative for chest pain, palpitations and leg swelling.  Gastrointestinal: Negative for abdominal pain and nausea.       No gerd  Genitourinary:       Urinary incontinence  Musculoskeletal: Positive for gait problem.  Neurological: Positive for dizziness. Negative for headaches (head feels heavy, no pain).  Psychiatric/Behavioral: Negative for confusion.       Objective:   Vitals:   04/28/18 1342  BP: 130/72  Pulse: 67  Resp: 18  Temp: 97.6 F (36.4 C)  SpO2: 96%   BP Readings from Last 3 Encounters:  04/28/18 130/72  04/18/18 124/70  03/06/18 124/80   Wt Readings from Last 3 Encounters:  04/28/18 132 lb (59.9 kg)  04/18/18 128 lb (58.1 kg)  03/06/18 128 lb (58.1 kg)   Body mass index is 22.66 kg/m.   Physical Exam    Constitutional: Appears well-developed and well-nourished. No distress.  HENT:  Head: Normocephalic.  Swelling and mild bruising left cheek below eye, small cut that is not bleeding  Neck: Neck supple. No tracheal deviation present. No thyromegaly present.  No cervical lymphadenopathy Cardiovascular: Normal rate, regular rhythm and normal heart sounds.   2/6 sys murmur heard. No carotid bruit .  No edema Pulmonary/Chest: Effort normal and breath sounds normal. No respiratory distress. No has no wheezes. No rales.  Skin: Skin is warm and dry. Not diaphoretic.  Psychiatric: Normal mood and affect. Behavior is normal.        Assessment & Plan:    See Problem List for Assessment and Plan of chronic medical problems.

## 2018-04-28 NOTE — Patient Instructions (Addendum)
See your cardiologist for frequent falls, dizziness. --  [phone]  A referral was ordered for neurology at [facility]  They will call you.     Start omeprazole 20 mg daily for your stomach/ hiccups.  If your hiccups do not improve send me a note through mychart.       Go downstairs and have an x-ray of your face.

## 2018-04-28 NOTE — Telephone Encounter (Signed)
[facility] called and gave results of exam done today.   There is a fracture of the right orbital floor with mild separation of fracture fragments. There is a questionable fracture of the anterior right zygomatic arch. It would be prudent to correlate with maxillofacial CT given orbital floor fracture on the right, to exclude possible degree of ocular entrapment

## 2018-04-28 NOTE — Telephone Encounter (Signed)
These are old fractures and they have been evaluated by specialist and no treatment is needed.    No new left sided fracture

## 2018-04-29 DIAGNOSIS — R296 Repeated falls: Secondary | ICD-10-CM | POA: Insufficient documentation

## 2018-04-29 DIAGNOSIS — S0993XA Unspecified injury of face, initial encounter: Secondary | ICD-10-CM | POA: Insufficient documentation

## 2018-04-29 NOTE — Assessment & Plan Note (Signed)
Will get an xray to rule out fracture to left cheek - he did fall last month and had multiple fractures to his face ( right side) that are still healing No symptoms suggestive of a concussion.  His wife was present and he did not fall hard - his face just hit the wall  - we will hold off on other imaging at this time

## 2018-04-29 NOTE — Assessment & Plan Note (Signed)
He is falling frequently.  He also reports urinary incontinence Concerns for NPH - prior scans of head show ventriculomegaly - but it has been stable and neurology and neurosurgery feels this is no NPH Referral to neurology

## 2018-05-05 ENCOUNTER — Encounter: Payer: Self-pay | Admitting: Neurology

## 2018-05-09 DIAGNOSIS — I4891 Unspecified atrial fibrillation: Secondary | ICD-10-CM

## 2018-05-09 DIAGNOSIS — E7800 Pure hypercholesterolemia, unspecified: Secondary | ICD-10-CM

## 2018-05-09 DIAGNOSIS — I495 Sick sinus syndrome: Secondary | ICD-10-CM

## 2018-05-09 DIAGNOSIS — H903 Sensorineural hearing loss, bilateral: Secondary | ICD-10-CM

## 2018-05-09 DIAGNOSIS — S0231XD Fracture of orbital floor, right side, subsequent encounter for fracture with routine healing: Secondary | ICD-10-CM | POA: Diagnosis not present

## 2018-05-09 DIAGNOSIS — G629 Polyneuropathy, unspecified: Secondary | ICD-10-CM

## 2018-05-09 DIAGNOSIS — G451 Carotid artery syndrome (hemispheric): Secondary | ICD-10-CM

## 2018-05-09 DIAGNOSIS — S0292XD Unspecified fracture of facial bones, subsequent encounter for fracture with routine healing: Secondary | ICD-10-CM

## 2018-05-09 DIAGNOSIS — I2510 Atherosclerotic heart disease of native coronary artery without angina pectoris: Secondary | ICD-10-CM

## 2018-05-09 DIAGNOSIS — S0240ED Zygomatic fracture, right side, subsequent encounter for fracture with routine healing: Secondary | ICD-10-CM

## 2018-05-09 DIAGNOSIS — S62101D Fracture of unspecified carpal bone, right wrist, subsequent encounter for fracture with routine healing: Secondary | ICD-10-CM

## 2018-05-09 DIAGNOSIS — Z7982 Long term (current) use of aspirin: Secondary | ICD-10-CM

## 2018-05-09 DIAGNOSIS — Z8673 Personal history of transient ischemic attack (TIA), and cerebral infarction without residual deficits: Secondary | ICD-10-CM

## 2018-05-09 DIAGNOSIS — Z87891 Personal history of nicotine dependence: Secondary | ICD-10-CM

## 2018-05-09 DIAGNOSIS — R339 Retention of urine, unspecified: Secondary | ICD-10-CM

## 2018-05-09 DIAGNOSIS — Z9181 History of falling: Secondary | ICD-10-CM

## 2018-05-12 ENCOUNTER — Telehealth: Payer: Self-pay | Admitting: Internal Medicine

## 2018-05-12 NOTE — Telephone Encounter (Signed)
Rec'd from [facility] forwarded 23 pages to Dr.Burns [facility]

## 2018-05-19 ENCOUNTER — Encounter (INDEPENDENT_AMBULATORY_CARE_PROVIDER_SITE_OTHER): Payer: Self-pay

## 2018-05-19 ENCOUNTER — Encounter: Payer: Self-pay | Admitting: Cardiology

## 2018-05-19 ENCOUNTER — Ambulatory Visit: Payer: Medicare Other | Admitting: Cardiology

## 2018-05-19 VITALS — BP 124/70 | HR 61 | Ht 65.0 in | Wt 129.8 lb

## 2018-05-19 DIAGNOSIS — I480 Paroxysmal atrial fibrillation: Secondary | ICD-10-CM | POA: Diagnosis not present

## 2018-05-19 DIAGNOSIS — R55 Syncope and collapse: Secondary | ICD-10-CM | POA: Diagnosis not present

## 2018-05-19 DIAGNOSIS — R296 Repeated falls: Secondary | ICD-10-CM | POA: Diagnosis not present

## 2018-05-19 DIAGNOSIS — I10 Essential (primary) hypertension: Secondary | ICD-10-CM

## 2018-05-19 DIAGNOSIS — I951 Orthostatic hypotension: Secondary | ICD-10-CM

## 2018-05-19 DIAGNOSIS — E785 Hyperlipidemia, unspecified: Secondary | ICD-10-CM

## 2018-05-19 DIAGNOSIS — I495 Sick sinus syndrome: Secondary | ICD-10-CM

## 2018-05-19 DIAGNOSIS — I25718 Atherosclerosis of autologous vein coronary artery bypass graft(s) with other forms of angina pectoris: Secondary | ICD-10-CM
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
## 2018-05-19 NOTE — Patient Instructions (Addendum)
Medication Instructions: Your physician has recommended you make the following change in your medication:   STOP: RANEXA   Labwork: None Ordered  Procedures/Testing: Your physician has requested that you have an echocardiogram. Echocardiography is a painless test that uses sound waves to create images of your heart. It provides your doctor with information about the size and shape of your heart and how well your heart's chambers and valves are working. This procedure takes approximately one hour. There are no restrictions for this procedure.    Follow-Up: Your physician recommends that you schedule a follow-up appointment with Dr. Lovena Le or a APP on his team at their next available  Follow up with Dr.Smith in 3 months    Any Additional Special Instructions Will Be Listed Below (If Applicable).   Orthostatic Hypotension Orthostatic hypotension is a sudden drop in blood pressure that happens when you quickly change positions, such as when you get up from a seated or lying position. Blood pressure is a measurement of how strongly, or weakly, your blood is pressing against the walls of your arteries. Arteries are blood vessels that carry blood from your heart throughout your body. When blood pressure is too low, you may not get enough blood to your brain or to the rest of your organs. This can cause weakness, light-headedness, rapid heartbeat, and fainting. This can last for just a few seconds or for up to a few minutes. Orthostatic hypotension is usually not a serious problem. However, if it happens frequently or gets worse, it may be a sign of something more serious. What are the causes? This condition may be caused by:  Sudden changes in posture, such as standing up quickly after you have been sitting or lying down.  Blood loss.  Loss of body fluids (dehydration).  Heart problems.  Hormone (endocrine) problems.  Pregnancy.  Severe infection.  Lack of certain  nutrients.  Severe allergic reactions (anaphylaxis).  Certain medicines, such as blood pressure medicine or medicines that make the body lose excess fluids (diuretics). Sometimes, this condition can be caused by not taking medicine as directed, such as taking too much of a certain medicine.   What increases the risk? Certain factors can make you more likely to develop orthostatic hypotension, including:  Age. Risk increases as you get older.  Conditions that affect the heart or the central nervous system.  Taking certain medicines, such as blood pressure medicine or diuretics.  Being pregnant.  What are the signs or symptoms? Symptoms of this condition may include:  Weakness.  Light-headedness.  Dizziness.  Blurred vision.  Fatigue.  Rapid heartbeat.  Fainting, in severe cases.  How is this diagnosed? This condition is diagnosed based on:  Your medical history.  Your symptoms.  Your blood pressure measurement. Your health care provider will check your blood pressure when you are: ? Lying down. ? Sitting. ? Standing.  A blood pressure reading is recorded as two numbers, such as "120 over 80" (or 120/80). The first ("top") number is called the systolic pressure. It is a measure of the pressure in your arteries as your heart beats. The second ("bottom") number is called the diastolic pressure. It is a measure of the pressure in your arteries when your heart relaxes between beats. Blood pressure is measured in a unit called mm Hg. Healthy blood pressure for adults is 120/80. If your blood pressure is below 90/60, you may be diagnosed with hypotension. Other information or tests that may be used to diagnose orthostatic hypotension  include:  Your other vital signs, such as your heart rate and temperature.  Blood tests.  Tilt table test. For this test, you will be safely secured to a table that moves you from a lying position to an upright position. Your heart rhythm and  blood pressure will be monitored during the test.  How is this treated? Treatment for this condition may include:  Changing your diet. This may involve eating more salt (sodium) or drinking more water.  Taking medicines to raise your blood pressure.  Changing the dosage of certain medicines you are taking that might be lowering your blood pressure.  Wearing compression stockings. These stockings help to prevent blood clots and reduce swelling in your legs.  In some cases, you may need to go to the hospital for:  Fluid replacement. This means you will receive fluids through an IV tube.  Blood replacement. This means you will receive donated blood through an IV tube (transfusion).  Treating an infection or heart problems, if this applies.  Monitoring. You may need to be monitored while medicines that you are taking wear off.  Follow these instructions at home: Eating and drinking   Drink enough fluid to keep your urine clear or pale yellow.  Eat a healthy diet and follow instructions from your health care provider about eating or drinking restrictions. A healthy diet includes: ? Fresh fruits and vegetables. ? Whole grains. ? Lean meats. ? Low-fat dairy products.  Eat extra salt only as directed. Do not add extra salt to your diet unless your health care provider told you to do that.  Eat frequent, small meals.  Avoid standing up suddenly after eating. Medicines  Take over-the-counter and prescription medicines only as told by your health care provider. ? Follow instructions from your health care provider about changing the dosage of your current medicines, if this applies. ? Do not stop or adjust any of your medicines on your own. General instructions  Wear compression stockings as told by your health care provider.  Get up slowly from lying down or sitting positions. This gives your blood pressure a chance to adjust.  Avoid hot showers and excessive heat as directed  by your health care provider.  Return to your normal activities as told by your health care provider. Ask your health care provider what activities are safe for you.  Do not use any products that contain nicotine or tobacco, such as cigarettes and e-cigarettes. If you need help quitting, ask your health care provider.  Keep all follow-up visits as told by your health care provider. This is important. Contact a health care provider if:  You vomit.  You have diarrhea.  You have a fever for more than 2-3 days.  You feel more thirsty than usual.  You feel weak and tired. Get help right away if:  You have chest pain.  You have a fast or irregular heartbeat.  You develop numbness in any part of your body.  You cannot move your arms or your legs.  You have trouble speaking.  You become sweaty or feel lightheaded.  You faint.  You feel short of breath.  You have trouble staying awake.  You feel confused. This information is not intended to replace advice given to you by your health care provider. Make sure you discuss any questions you have with your health care provider. Document Released: 08/20/2002 Document Revised: 05/18/2016 Document Reviewed: 02/20/2016 Elsevier Interactive Patient Education  [employer].    If you  need a refill on your cardiac medications before your next appointment, please call your pharmacy.

## 2018-05-29 ENCOUNTER — Ambulatory Visit: Payer: Medicare Other | Admitting: Neurology

## 2018-06-01 ENCOUNTER — Other Ambulatory Visit (HOSPITAL_COMMUNITY): Payer: Medicare Other

## 2018-06-02 ENCOUNTER — Ambulatory Visit (INDEPENDENT_AMBULATORY_CARE_PROVIDER_SITE_OTHER): Payer: Medicare Other | Admitting: Internal Medicine

## 2018-06-02 ENCOUNTER — Ambulatory Visit (HOSPITAL_COMMUNITY): Payer: Medicare Other | Attending: Cardiology

## 2018-06-02 ENCOUNTER — Encounter: Payer: Self-pay | Admitting: Internal Medicine

## 2018-06-02 ENCOUNTER — Other Ambulatory Visit: Payer: Self-pay

## 2018-06-02 VITALS — BP 128/78 | HR 64 | Ht 63.0 in | Wt 131.0 lb

## 2018-06-02 DIAGNOSIS — I2510 Atherosclerotic heart disease of native coronary artery without angina pectoris: Secondary | ICD-10-CM | POA: Diagnosis not present

## 2018-06-02 DIAGNOSIS — R55 Syncope and collapse: Secondary | ICD-10-CM | POA: Diagnosis not present

## 2018-06-02 DIAGNOSIS — I495 Sick sinus syndrome: Secondary | ICD-10-CM

## 2018-06-02 DIAGNOSIS — I4891 Unspecified atrial fibrillation: Secondary | ICD-10-CM | POA: Insufficient documentation

## 2018-06-02 DIAGNOSIS — Z951 Presence of aortocoronary bypass graft: Secondary | ICD-10-CM | POA: Insufficient documentation

## 2018-06-02 DIAGNOSIS — I10 Essential (primary) hypertension: Secondary | ICD-10-CM

## 2018-06-02 DIAGNOSIS — Z950 Presence of cardiac pacemaker: Secondary | ICD-10-CM

## 2018-06-02 DIAGNOSIS — Z23 Encounter for immunization: Secondary | ICD-10-CM

## 2018-06-02 DIAGNOSIS — I480 Paroxysmal atrial fibrillation: Secondary | ICD-10-CM | POA: Diagnosis not present

## 2018-06-02 DIAGNOSIS — E785 Hyperlipidemia, unspecified: Secondary | ICD-10-CM | POA: Insufficient documentation

## 2018-06-02 DIAGNOSIS — R001 Bradycardia, unspecified: Secondary | ICD-10-CM | POA: Insufficient documentation

## 2018-06-02 DIAGNOSIS — I083 Combined rheumatic disorders of mitral, aortic and tricuspid valves: Secondary | ICD-10-CM | POA: Diagnosis not present
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
## 2018-06-02 NOTE — Patient Instructions (Addendum)
Medication Instructions:  Your physician recommends that you continue on your current medications as directed. Please refer to the Current Medication list given to you today.  INCREASE YOUR FLUIDS  INCREASE YOUR SALT  Labwork: None ordered.  Testing/Procedures: None ordered.  Follow-Up: Your physician wants you to follow-up in: one year with Dr. Lovena Le.   You will receive a reminder letter in the mail two months in advance. If you don't receive a letter, please call our office to schedule the follow-up appointment.  Remote monitoring is used to monitor your Pacemaker from home. This monitoring reduces the number of office visits required to check your device to one time per year. It allows [place] to keep an eye on the functioning of your device to ensure it is working properly. You are scheduled for a device check from home on 06/14/2018. You may send your transmission at any time that day. If you have a wireless device, the transmission will be sent automatically. After your physician reviews your transmission, you will receive a postcard with your next transmission date.  Any Other Special Instructions Will Be Listed Below (If Applicable).  If you need a refill on your cardiac medications before your next appointment, please call your pharmacy.

## 2018-06-14 ENCOUNTER — Telehealth: Payer: Self-pay | Admitting: Cardiology

## 2018-06-14 ENCOUNTER — Encounter: Payer: Medicare Other | Admitting: *Deleted

## 2018-06-14 NOTE — Telephone Encounter (Signed)
LMOVM reminding pt to send remote transmission.   

## 2018-06-15 ENCOUNTER — Encounter: Payer: Self-pay | Admitting: Cardiology

## 2018-06-15 ENCOUNTER — Encounter: Payer: Self-pay | Admitting: Interventional Cardiology
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
## 2018-06-26 ENCOUNTER — Ambulatory Visit: Payer: Medicare Other | Admitting: Internal Medicine

## 2018-06-26 ENCOUNTER — Encounter: Payer: Self-pay | Admitting: Internal Medicine

## 2018-06-26 ENCOUNTER — Other Ambulatory Visit (INDEPENDENT_AMBULATORY_CARE_PROVIDER_SITE_OTHER): Payer: Medicare Other

## 2018-06-26 VITALS — BP 144/72 | HR 61 | Resp 16 | Ht 63.0 in | Wt 130.8 lb

## 2018-06-26 DIAGNOSIS — N3941 Urge incontinence: Secondary | ICD-10-CM

## 2018-06-26 DIAGNOSIS — R2689 Other abnormalities of gait and mobility: Secondary | ICD-10-CM | POA: Insufficient documentation

## 2018-06-26 DIAGNOSIS — R7303 Prediabetes: Secondary | ICD-10-CM

## 2018-06-26 DIAGNOSIS — R413 Other amnesia: Secondary | ICD-10-CM | POA: Diagnosis not present

## 2018-06-26 DIAGNOSIS — S0292XS Unspecified fracture of facial bones, sequela: Secondary | ICD-10-CM

## 2018-06-26 DIAGNOSIS — R296 Repeated falls: Secondary | ICD-10-CM

## 2018-06-26 DIAGNOSIS — R5381 Other malaise: Secondary | ICD-10-CM | POA: Diagnosis not present

## 2018-06-26 DIAGNOSIS — S0292XA Unspecified fracture of facial bones, initial encounter for closed fracture: Secondary | ICD-10-CM | POA: Insufficient documentation

## 2018-06-26 LAB — CBC WITH DIFFERENTIAL/PLATELET
BASOS ABS: 0.1 10*3/uL (ref 0.0–0.1)
Basophils Relative: 1.3 % (ref 0.0–3.0)
EOS ABS: 0.5 10*3/uL (ref 0.0–0.7)
Eosinophils Relative: 5.5 % — ABNORMAL HIGH (ref 0.0–5.0)
HEMATOCRIT: 42.5 % (ref 39.0–52.0)
HEMOGLOBIN: 14.1 g/dL (ref 13.0–17.0)
LYMPHS PCT: 25.4 % (ref 12.0–46.0)
Lymphs Abs: 2.1 10*3/uL (ref 0.7–4.0)
MCHC: 33.2 g/dL (ref 30.0–36.0)
MCV: 92 fl (ref 78.0–100.0)
MONO ABS: 0.6 10*3/uL (ref 0.1–1.0)
Monocytes Relative: 7 % (ref 3.0–12.0)
Neutro Abs: 5 10*3/uL (ref 1.4–7.7)
Neutrophils Relative %: 60.8 % (ref 43.0–77.0)
Platelets: 353 10*3/uL (ref 150.0–400.0)
RBC: 4.62 Mil/uL (ref 4.22–5.81)
RDW: 13.3 % (ref 11.5–15.5)
WBC: 8.2 10*3/uL (ref 4.0–10.5)

## 2018-06-26 LAB — VITAMIN B12: Vitamin B-12: 608 pg/mL (ref 211–911)

## 2018-06-26 LAB — COMPREHENSIVE METABOLIC PANEL
ALBUMIN: 4.3 g/dL (ref 3.5–5.2)
ALK PHOS: 88 U/L (ref 39–117)
ALT: 14 U/L (ref 0–53)
AST: 16 U/L (ref 0–37)
BUN: 14 mg/dL (ref 6–23)
CALCIUM: 9.6 mg/dL (ref 8.4–10.5)
CO2: 28 mEq/L (ref 19–32)
CREATININE: 0.86 mg/dL (ref 0.40–1.50)
Chloride: 103 mEq/L (ref 96–112)
GFR: 89.79 mL/min (ref >60.00)
Glucose, Bld: 105 mg/dL — ABNORMAL HIGH (ref 70–99)
Potassium: 4.1 mEq/L (ref 3.5–5.1)
SODIUM: 137 meq/L (ref 135–145)
TOTAL PROTEIN: 7.8 g/dL (ref 6.0–8.3)
Total Bilirubin: 0.4 mg/dL (ref 0.2–1.2)

## 2018-06-26 LAB — TSH: TSH: 2.07 u[IU]/mL (ref 0.35–4.50)

## 2018-06-26 LAB — HEMOGLOBIN A1C: HEMOGLOBIN A1C: 5.7 % (ref 4.6–6.5)

## 2018-06-26 LAB — VITAMIN D 25 HYDROXY (VIT D DEFICIENCY, FRACTURES): VITD: 42.68 ng/mL (ref 30.00–100.00)

## 2018-06-26 NOTE — Assessment & Plan Note (Addendum)
Check a1c Low sugar / carb diet   

## 2018-06-26 NOTE — Patient Instructions (Addendum)
  Tests ordered today. Your results will be released to [place] (or called to you) after review, usually within 72hours after test completion. If any changes need to be made, you will be notified at that same time.  Medications reviewed and updated.  Changes include :   none   A referral was ordered for physical therapy  Please followup in 4 months

## 2018-06-26 NOTE — Assessment & Plan Note (Signed)
Related to a fall line will check vitamin D level just to make sure that is not low Currently taking supplementation, but unsure how much

## 2018-06-26 NOTE — Assessment & Plan Note (Signed)
flomax stopped by urology Very frequent urination and incontinence Has f/u with urology next month

## 2018-06-26 NOTE — Assessment & Plan Note (Signed)
Probably multifactorial in nature Has had multiple falls, which is likely contributing Blood work 6 months ago unremarkable We will recheck vitamin B12 level and TSH He has seen urology in the past and he can see them again for further evaluation No need to image since he has had several imaging tests of his head in the past couple of years

## 2018-06-26 NOTE — Assessment & Plan Note (Signed)
Refer to PT

## 2018-06-27 ENCOUNTER — Encounter: Payer: Self-pay | Admitting: Internal Medicine

## 2018-07-04 ENCOUNTER — Telehealth: Payer: Self-pay | Admitting: Internal Medicine

## 2018-07-04 NOTE — Telephone Encounter (Signed)
Left patient vm to come back by the office to complete anther DPR.  The last one completed was not completed correctly for our office to scan.

## 2018-07-24 ENCOUNTER — Ambulatory Visit: Payer: Self-pay

## 2018-07-24 NOTE — Telephone Encounter (Signed)
Pt was trying to get out of bed and he fell against a window seat. Pt hit the right side of his chest. Pt stated that pain 6-7 out of 10. Daughter (who was with pt) stated there is no bruising or bleeding noted. Pt stated he cannot take a deep breath in.  Reason for Disposition . [1] Can't take a deep breath BUT [2] no respiratory distress  Answer Assessment - Initial Assessment Questions 1. MECHANISM: "How did the injury happen?"     Fell this morning at 0500 into window seat 2. ONSET: "When did the injury happen?" (Minutes or hours ago)     0500 3. LOCATION: "Where on the chest is the injury located?"     Right side 4. APPEARANCE: "What does the injury look like?"     No brusing, bleeding or cuts 5. BLEEDING: "Is there any bleeding now? If so, ask: How long has it been bleeding?"     no 6. SEVERITY: "Any difficulty with breathing?"     no 7. SIZE: For cuts, bruises, or swelling, ask: "How large is it?" (e.g., inches or centimeters)     n/a 8. PAIN: "Is there pain?" If so, ask: "How bad is the pain?"   (e.g., Scale 1-10; or mild, moderate, severe)     6-7 out of 10 9. TETANUS: For any breaks in the skin, ask: "When was the last tetanus booster?"     n/a 10. PREGNANCY: "Is there any chance you are pregnant?" "When was your last menstrual period?"       n/a  Protocols used: CHEST INJURY-A-AH

## 2018-07-27 ENCOUNTER — Ambulatory Visit: Payer: Medicare Other

## 2018-08-09 ENCOUNTER — Encounter: Payer: Self-pay | Admitting: Internal Medicine

## 2018-08-14 ENCOUNTER — Ambulatory Visit: Payer: Medicare Other | Admitting: Internal Medicine

## 2018-08-14 ENCOUNTER — Other Ambulatory Visit: Payer: Self-pay | Admitting: Internal Medicine

## 2018-08-28 ENCOUNTER — Ambulatory Visit: Payer: Medicare Other | Admitting: Interventional Cardiology

## 2018-09-15 ENCOUNTER — Ambulatory Visit (INDEPENDENT_AMBULATORY_CARE_PROVIDER_SITE_OTHER): Payer: Medicare Other

## 2018-09-15 DIAGNOSIS — I495 Sick sinus syndrome: Secondary | ICD-10-CM

## 2018-09-17 LAB — CUP PACEART REMOTE DEVICE CHECK
Implantable Lead Implant Date: 20121214
Implantable Lead Implant Date: 20121214
Implantable Lead Location: 753860
Implantable Lead Serial Number: 29066260
MDC IDC LEAD LOCATION: 753859
MDC IDC LEAD SERIAL: 29098858
MDC IDC PG IMPLANT DT: 20121214
MDC IDC SESS DTM: 20200105134427
Pulse Gen Serial Number: 118262

## 2018-09-18 NOTE — Progress Notes (Signed)
Remote pacemaker transmission.   

## 2018-09-22 ENCOUNTER — Encounter: Payer: Self-pay | Admitting: Internal Medicine

## 2018-10-05 ENCOUNTER — Other Ambulatory Visit: Payer: Self-pay | Admitting: Internal Medicine

## 2018-10-12 ENCOUNTER — Other Ambulatory Visit: Payer: Self-pay | Admitting: Interventional Cardiology

## 2018-10-13 NOTE — Telephone Encounter (Addendum)
Left message to call back  We need to clarify that pt has been taking Diltiazem.  It's not listed on the last two visits in our office but is listed on last two visits to urgent care.

## 2018-10-17 NOTE — Telephone Encounter (Signed)
Attempted to contact pt for clarification on Dilt.  VM is now full.

## 2018-10-18 ENCOUNTER — Encounter: Payer: Self-pay | Admitting: *Deleted

## 2018-10-18 NOTE — Telephone Encounter (Signed)
My Chart message sent

## 2018-10-18 NOTE — Telephone Encounter (Signed)
Spoke with Dr. Tamala Julian and he said as long as pt has been taking this all along, ok to fill.

## 2018-10-22 ENCOUNTER — Other Ambulatory Visit: Payer: Self-pay | Admitting: Internal Medicine

## 2018-10-23 ENCOUNTER — Encounter: Payer: Self-pay | Admitting: Internal Medicine

## 2018-10-25 ENCOUNTER — Ambulatory Visit: Payer: Medicare Other | Admitting: Internal Medicine

## 2018-11-06 ENCOUNTER — Telehealth: Payer: Self-pay | Admitting: Internal Medicine

## 2018-11-06 NOTE — Telephone Encounter (Signed)
Gave ok for verbal orders.  

## 2018-11-06 NOTE — Telephone Encounter (Signed)
Copied from [place] [id number]. Topic: Quick Communication - Home Health Verbal Orders >> Nov 06, 2018  3:51 PM Berneta Levins wrote: Caller/Agency: Wilhemena Durie from [facility] Number: [phone], OK to leave a message Requesting OT/PT/Skilled Nursing/Social Work: PT Frequency: 2x a week for 4 weeks

## 2018-11-08 ENCOUNTER — Ambulatory Visit: Payer: Medicare Other | Admitting: Internal Medicine

## 2018-11-13 ENCOUNTER — Telehealth: Payer: Self-pay | Admitting: Internal Medicine

## 2018-11-13 NOTE — Telephone Encounter (Signed)
FYI.. Patient is scheduled to be seen tomorrow. I have tried to contact them by both numbers on chart to confirm, no answer on either.
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
## 2018-11-13 NOTE — Telephone Encounter (Signed)
According to neuro telephone note in care everywhere  It looks like his daughter took him to [place] with her.  Please call daughter Shaun James  [phone] can confirm he is in [place] with her and will not be coming to appt.    I do not think he is in any danger.

## 2018-11-14 ENCOUNTER — Ambulatory Visit: Payer: Medicare Other | Admitting: Internal Medicine

## 2018-11-14 ENCOUNTER — Encounter: Payer: Self-pay | Admitting: Internal Medicine

## 2018-11-14 VITALS — BP 124/72 | HR 65 | Temp 97.6°F | Resp 16 | Ht 63.0 in | Wt 122.2 lb

## 2018-11-14 DIAGNOSIS — R413 Other amnesia: Secondary | ICD-10-CM | POA: Diagnosis not present

## 2018-11-14 DIAGNOSIS — I25718 Atherosclerosis of autologous vein coronary artery bypass graft(s) with other forms of angina pectoris: Secondary | ICD-10-CM

## 2018-11-14 DIAGNOSIS — R21 Rash and other nonspecific skin eruption: Secondary | ICD-10-CM | POA: Diagnosis not present

## 2018-11-14 DIAGNOSIS — R110 Nausea: Secondary | ICD-10-CM

## 2018-11-14 DIAGNOSIS — F419 Anxiety disorder, unspecified: Secondary | ICD-10-CM | POA: Diagnosis not present

## 2018-11-14 MED ORDER — PREDNISONE 10 MG PO TABS: ORAL_TABLET | ORAL | 0 refills | Status: DC

## 2018-11-14 MED ORDER — DONEPEZIL HCL 5 MG PO TABS: 5.0000 mg | ORAL_TABLET | Freq: Every day | ORAL | 1 refills | Status: DC

## 2018-11-14 MED ORDER — DIAZEPAM 2 MG PO TABS: 2.0000 mg | ORAL_TABLET | Freq: Every day | ORAL | 0 refills | Status: AC | PRN

## 2018-11-14 MED ORDER — ONDANSETRON 4 MG PO TBDP: ORAL_TABLET | ORAL | 5 refills | Status: DC

## 2018-11-14 NOTE — Assessment & Plan Note (Signed)
Tolerating Aricept without dizziness Continue 5 mg daily

## 2018-11-14 NOTE — Telephone Encounter (Signed)
Spoke with pts daughter and she advised that Shaun James will be at his appointment today.

## 2018-11-14 NOTE — Assessment & Plan Note (Signed)
No chest pain, palpitations or shortness of breath Following with cardiology Continue current medications 

## 2018-11-14 NOTE — Assessment & Plan Note (Addendum)
Having some intermittent nausea Okay to continue Zofran as needed His family feel this is most likely stress related Discussed with his family that this could be atypical GERD as well and consider trying Tums or liquid antacid

## 2018-11-14 NOTE — Assessment & Plan Note (Signed)
Experiencing increased anxiety and stress related to his current situation with his wife Has had Valium in the past and tolerated it well Will prescribe 2 mg Valium to use daily as needed-only use as needed Discussed with his family that this may increase his risk of falls-he is never left alone Also discussed that if used on a regular basis could affect his memory and they will only use if absolutely needed

## 2018-11-14 NOTE — Patient Instructions (Addendum)
Take the prednisone as prescribed.  You can apply topical anti-itch medications.   Valium as needed.  zofran as needed.     Your prescription(s) have been submitted to your pharmacy. Please take as directed and contact our office if you believe you are having problem(s) with the medication(s).   Please followup in 6 months - sooner if needed

## 2018-11-14 NOTE — Progress Notes (Signed)
Subjective:    Patient ID: Shaun James, male    DOB: 1930/12/26, 85 y.o.   MRN: [id number]  HPI The patient is here for an acute visit.  He is here with his 2 daughters and a home health aide.   Rash:  He has a rash on his abdomen, chest, back and on his arms.   It is itchy.  It has been there over two weeks and is spreading.  There has been nothing new that they can think of that could have caused this including products, medication use or supplements.  He was on several supplements that his wife was giving him that he is no longer taking.  They have tried topical lotion and topical Benadryl cream that helped transiently if at all.  Nausea: He is having some intermittent nausea.  There have been no concerns of heartburn.  His family believes the nausea is related to increased stress.  He has taken Zofran as needed and would like another prescription to have on hand if needed.  Increased stress: He is married and was living with his wife, but his family removed him from that house because they believe his wife was not taking good care of him.  Per them she was giving him multiple supplements and are unsure what they were.  She was holding some of his prescription medications.  She would leave him alone and do not feel that he will was being properly cared for.  Dementia: He does follow with neurology.  He is taking Aricept.  His wife had stopped the medication because it had caused dizziness.  His family restarted it and there has been no dizziness and he has been much less confused and more clear.  His memory has improved.  Because of the increased stress they would like to have a very small dose of Valium on hand to give him if needed.  He does get agitated and very upset at times because of everything that has happened.  He has had a couple of doses in the recent past and had no side effects.  He is never left alone.     Medications and allergies reviewed with patient and updated if  appropriate.  Patient Active Problem List   Diagnosis Date Noted  . Rash and nonspecific skin eruption 11/14/2018  . Facial fracture ([place]) 06/26/2018  . Physical deconditioning 06/26/2018  . Poor balance 06/26/2018  . Injury of face 04/29/2018  . Recurrent falls 04/29/2018  . Dizziness 04/18/2018  . Fall at home 04/18/2018  . Multiple facial fractures ([place]) 04/18/2018  . Memory difficulties 01/09/2018  . Urgency incontinence 01/09/2018  . Ulnar neuropathy of right upper extremity 12/19/2017  . Anxiety 12/19/2017  . Sleep difficulties 09/21/2017  . Acute left ankle pain 09/21/2017  . Subdural hemorrhage ([place]) 05/09/2017  . Subarachnoid hemorrhage ([place]) 05/09/2017  . Prediabetes 04/06/2017  . Nausea 10/26/2016  . Cervical radiculopathy at C8 10/08/2016  . Paroxysmal atrial fibrillation (HCC)   . Expressive aphasia 08/18/2016  . Numbness and tingling of right arm 08/17/2016  . AAA (abdominal aortic aneurysm) without rupture ([place]) 04/25/2016  . Pulmonary nodule 04/25/2016  . Bilateral hearing loss 03/22/2016  . Aneurysm, cerebral, nonruptured 03/11/2016  . Carotid artery stenosis 03/11/2016  . BPPV (benign paroxysmal positional vertigo) 03/10/2016  . Ataxia   . Burning tongue 08/12/2015  . GERD (gastroesophageal reflux disease) 08/12/2015  . BPH without urinary obstruction 04/10/2015  . 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  Chronic headache   . Osteoarthritis   . Cardiac pacemaker in situ 10/18/2011  . CAD (coronary artery disease), autologous vein bypass graft 09/13/2011  . Sick sinus syndrome ([place]) 09/13/2011  . Hyperlipidemia with target LDL less than 70 04/16/2008  . Acute myocardial infarction ([place]) 04/16/2008  . Pulmonary fibrosis ([place]) 04/16/2008    Current Outpatient Medications on File Prior to Visit  Medication Sig Dispense Refill  . acetaminophen (TYLENOL) 500 MG tablet Take 1 tablet (500 mg total) by mouth daily as needed for headache. 14 tablet 0  .  aspirin 325 MG tablet Take 325 mg by mouth daily.    Marland Kitchen CARTIA XT 120 MG 24 hr capsule TAKE 1 CAPSULE DAILY 90 capsule 3  . nitroGLYCERIN (NITROSTAT) 0.4 MG SL tablet Place 0.4 mg under the tongue every 5 (five) minutes as needed for chest pain.    . simvastatin (ZOCOR) 20 MG tablet TAKE 1 TABLET EVERY EVENING 90 tablet 1   No current facility-administered medications on file prior to visit.     Past Medical History:  Diagnosis Date  . AAA (abdominal aortic aneurysm) (HCC)    3.1 cm AAA, reevaluated 08/2009 per ultrasound - stable  . Allergic rhinitis   . Aortic sclerosis    Probable AS on physical exam, 2010  . Aortic stenosis    mild AS 2014 echo  . Arthritis   . Atrial fibrillation (HCC)    chronic anticoag  . Atrial flutter ([place])   . Basal cell carcinoma   . Benign positional vertigo   . BPH (benign prostatic hyperplasia)   . CAD in native artery    a) s/p CABG '98; LIMA-LAD, SVG-D1, SVG-OM1, SVG-PDA); b) CATH -2009: midLAD & RCA 100%, OM2 100% wtih severe native Cx; Grafts patent with ~30-50% SVG-D1 & ~30-40% SVG-OM, EF 45%; c) Lexiscan Cardiolite 02/2014: EF 61%, No Ischemia; subtle fixed anteroseptal defect.  . Cardiac pacemaker in [place]  . Coronary atherosclerosis of native coronary artery   . Diverticulosis of colon (without mention of hemorrhage)   . DJD (degenerative joint disease)   . Esophageal reflux   . Essential hypertension, benign   . GERD (gastroesophageal reflux disease)   . Headache    after brain aneurysm  . History of tuberculosis    remote Hx  . Inflamed seborrheic keratosis   . Intermittent confusion   . Internal hemorrhoids    severe  . Mixed hyperlipidemia   . Mouth burn   . Myocardial infarction ([place])   . Near syncope    uncertain cause. R/O arrhythmia, R/O med effect  . Osteoarthritis 06/14/2012  . Peptic ulcer, unspecified site, unspecified as acute or chronic, without mention of hemorrhage, perforation, or obstruction     . Personal history of colonic polyps   . Pneumonia   . Presbycusis of both ears    Bilateral hearing aids  . Presence of permanent cardiac pacemaker   . Prostatitis dx 12/2102   E coli Ucx  . SSS (sick sinus syndrome) (HCC)    syncope, s/p ppm 12/12  . Systolic murmur    Worrisome for AS. AS could cause exertional fatigue.    Past Surgical History:  Procedure Laterality Date  . CARDIAC CATHETERIZATION  2002  . cataracts     bilateral  . COLONOSCOPY    . CORONARY ARTERY BYPASS GRAFT  1997   (LIMA to LAD, SVG to diagonal-50% closed on catheterization in 1999, SVG to OM1, SVG to PDA)  Repeat cath 2009 with patent grafts  . EXCISION OF TONGUE LESION    . EYE SURGERY Bilateral    cataract surgery with lens implant  . HEMORRHOID SURGERY  07/31/2010  . INSERT / REPLACE / REMOVE PACEMAKER  08/27/2011   PPM implant  . IR GENERIC HISTORICAL  04/06/2016   IR ANGIO VERTEBRAL SEL SUBCLAVIAN INNOMINATE UNI L MOD SED 04/06/2016 Luanne Bras, MD [facility]  . IR GENERIC HISTORICAL  04/06/2016   IR ANGIO VERTEBRAL SEL VERTEBRAL UNI R MOD SED 04/06/2016 Luanne Bras, MD [facility]  . IR GENERIC HISTORICAL  04/06/2016   IR ANGIO INTRA EXTRACRAN SEL COM CAROTID INNOMINATE BILAT MOD SED 04/06/2016 Luanne Bras, MD [facility]  . IR GENERIC HISTORICAL  05/19/2016   IR ANGIO INTRA EXTRACRAN SEL INTERNAL CAROTID UNI L MOD SED 05/19/2016 Luanne Bras, MD [facility]  . IR GENERIC HISTORICAL  05/19/2016   IR NEURO EACH ADD'L AFTER BASIC UNI LEFT (MS) 05/19/2016 Luanne Bras, MD [facility]  . IR GENERIC HISTORICAL  05/19/2016   IR ANGIOGRAM FOLLOW UP STUDY 05/19/2016 Luanne Bras, MD [facility]  . IR GENERIC HISTORICAL  05/19/2016   IR ANGIO VERTEBRAL SEL VERTEBRAL UNI L MOD SED 05/19/2016 Luanne Bras, MD [facility]  . IR GENERIC HISTORICAL  05/19/2016   IR TRANSCATH/EMBOLIZ 05/19/2016 Luanne Bras, MD [facility]  . IR GENERIC HISTORICAL  05/19/2016   IR 3D  INDEPENDENT WKST 05/19/2016 Luanne Bras, MD [facility]  . IR GENERIC HISTORICAL  06/03/2016   IR RADIOLOGIST EVAL & MGMT 06/03/2016 [facility]  . IR GENERIC HISTORICAL  07/20/2016   IR RADIOLOGIST EVAL & MGMT 07/20/2016 [facility]  . left rotator cuff surgery  2000  . PERMANENT PACEMAKER INSERTION N/A 08/27/2011   Procedure: PERMANENT PACEMAKER INSERTION;  Surgeon: Evans Lance, MD;  Location: [facility] CATH LAB;  Service: Cardiovascular;  Laterality: N/A;  . PUNCH BIOPSY OF SKIN  08/2009   5 mm punch biopsy on upper mid back melanotic appearing lesion  . RADIOLOGY WITH ANESTHESIA N/A 05/19/2016   Procedure: Embolization;  Surgeon: Luanne Bras, MD;  Location: [facility];  Service: Radiology;  Laterality: N/A;    Social History   Socioeconomic History  . Marital status: Married    Spouse name: Not on file  . Number of children: 4  . Years of education: Not on file  . Highest education level: Not on file  Occupational History  . Occupation: retired    Comment: retired Art gallery manager  Social Needs  . Financial resource strain: Not on file  . Food insecurity:    Worry: Not on file    Inability: Not on file  . Transportation needs:    Medical: Not on file    Non-medical: Not on file  Tobacco Use  . Smoking status: Former Smoker    Packs/day: 0.50    Years: 15.00    Pack years: 7.50    Types: Cigarettes    Last attempt to quit: 09/13/1965    Years since quitting: 53.2  . Smokeless tobacco: Never Used  Substance and Sexual Activity  . Alcohol use: No    Alcohol/week: 0.0 standard drinks  . Drug use: No  . Sexual activity: Not Currently  Lifestyle  . Physical activity:    Days per week: Not on file    Minutes per session: Not on file  . Stress: Not on file  Relationships  . Social connections:    Talks on phone: Not on  file    Gets together: Not on file    Attends religious service: Not on file    Active member of club or organization: Not on file    Attends  meetings of clubs or organizations: Not on file    Relationship status: Not on file  Other Topics Concern  . Not on file  Social History Narrative   Moved from [place] in [place] alone; lives in 2 story home but stays downstairs   3 children / 1 other;     Family History  Problem Relation Age of Onset  . Other Father 66       [place]  . Heart attack Mother 40  . Heart disease Mother        ASHD  . COPD Brother   . Hypertension Brother   . Heart disease Brother        ASHD  . Heart disease Brother        ASHD  . Heart disease Sister        ASHD  . COPD Brother   . Coronary artery disease Unknown     Review of Systems  Constitutional: Negative for chills and fever.  Respiratory: Negative for cough, shortness of breath and wheezing.   Cardiovascular: Negative for chest pain, palpitations and leg swelling.  Gastrointestinal: Positive for nausea. Negative for abdominal pain.  Genitourinary: Positive for frequency.  Neurological: Positive for headaches (Chronic). Negative for dizziness and light-headedness.       Objective:   Vitals:   11/14/18 1557  BP: 124/72  Pulse: 65  Resp: 16  Temp: 97.6 F (36.4 C)  SpO2: 97%   BP Readings from Last 3 Encounters:  11/14/18 124/72  06/26/18 (!) 144/72  06/02/18 128/78   Wt Readings from Last 3 Encounters:  11/14/18 122 lb 3.2 oz (55.4 kg)  06/26/18 130 lb 12.8 oz (59.3 kg)  06/02/18 131 lb (59.4 kg)   Body mass index is 21.65 kg/m.   Physical Exam    Constitutional: Appears well-developed and well-nourished. No distress.  HENT:  Head: Normocephalic and atraumatic.  Neck: Neck supple. No tracheal deviation present. No thyromegaly present.  No cervical lymphadenopathy Cardiovascular: Normal rate, regular rhythm and normal heart sounds.   No murmur heard. No carotid bruit .  No edema Pulmonary/Chest: Effort normal and breath sounds normal. No respiratory distress. No has no wheezes. No rales.  Skin: Skin is  warm and dry. Not diaphoretic. Maculopapular rash on chest, abdomen and back, few lesions on arms, there were some scratch marks and excoriated areas Psychiatric: Normal mood and affect. Behavior is normal.       Assessment & Plan:    See Problem List for Assessment and Plan of chronic medical problems.

## 2018-11-14 NOTE — Assessment & Plan Note (Signed)
Rash on chest, abdomen, back and starting to go on arms Getting worse-present for 2 weeks Likely allergic in nature ?  Cause We will treat with oral prednisone 30 mg x 3 days, 20 mg x 3 days and 10 mg x 3 days Can use Claritin and topical anti-itch medications Call if no improvement or if rash recurs

## 2018-11-15 DIAGNOSIS — I495 Sick sinus syndrome: Secondary | ICD-10-CM

## 2018-11-15 DIAGNOSIS — Z8673 Personal history of transient ischemic attack (TIA), and cerebral infarction without residual deficits: Secondary | ICD-10-CM

## 2018-11-15 DIAGNOSIS — I480 Paroxysmal atrial fibrillation: Secondary | ICD-10-CM | POA: Diagnosis not present

## 2018-11-15 DIAGNOSIS — I2510 Atherosclerotic heart disease of native coronary artery without angina pectoris: Secondary | ICD-10-CM

## 2018-11-15 DIAGNOSIS — G629 Polyneuropathy, unspecified: Secondary | ICD-10-CM

## 2018-11-15 DIAGNOSIS — Z950 Presence of cardiac pacemaker: Secondary | ICD-10-CM

## 2018-11-15 DIAGNOSIS — Z87891 Personal history of nicotine dependence: Secondary | ICD-10-CM

## 2018-11-15 DIAGNOSIS — E7800 Pure hypercholesterolemia, unspecified: Secondary | ICD-10-CM

## 2018-11-15 DIAGNOSIS — Z9181 History of falling: Secondary | ICD-10-CM

## 2018-11-23 ENCOUNTER — Telehealth: Payer: Self-pay

## 2018-11-23 ENCOUNTER — Encounter: Payer: Self-pay | Admitting: Internal Medicine

## 2018-11-23 NOTE — Telephone Encounter (Signed)
PA for diazepam was approved from 09/13/18-12/22/18. Pharmacy is aware.

## 2018-12-04 ENCOUNTER — Encounter: Payer: Self-pay | Admitting: Internal Medicine

## 2018-12-04 MED ORDER — MIRABEGRON ER 25 MG PO TB24: 25.0000 mg | ORAL_TABLET | Freq: Every day | ORAL | 5 refills | Status: DC

## 2018-12-15 ENCOUNTER — Ambulatory Visit (INDEPENDENT_AMBULATORY_CARE_PROVIDER_SITE_OTHER): Payer: Medicare Other | Admitting: *Deleted

## 2018-12-15 ENCOUNTER — Other Ambulatory Visit: Payer: Self-pay

## 2018-12-15 DIAGNOSIS — I495 Sick sinus syndrome: Secondary | ICD-10-CM | POA: Diagnosis not present

## 2018-12-15 DIAGNOSIS — I480 Paroxysmal atrial fibrillation: Secondary | ICD-10-CM

## 2018-12-16 LAB — CUP PACEART REMOTE DEVICE CHECK
Battery Remaining Longevity: 36 mo
Battery Remaining Percentage: 54 %
Brady Statistic RA Percent Paced: 40 %
Brady Statistic RV Percent Paced: 3 %
Date Time Interrogation Session: 20200404004400
Implantable Lead Implant Date: 20121214
Implantable Lead Implant Date: 20121214
Implantable Lead Location: 753859
Implantable Lead Location: 753860
Implantable Lead Model: 4135
Implantable Lead Model: 4136
Implantable Lead Serial Number: 29066260
Implantable Lead Serial Number: 29098858
Implantable Pulse Generator Implant Date: 20121214
Lead Channel Impedance Value: 529 Ohm
Lead Channel Impedance Value: 755 Ohm
Lead Channel Pacing Threshold Amplitude: 0.6 V
Lead Channel Pacing Threshold Amplitude: 1 V
Lead Channel Pacing Threshold Pulse Width: 0.4 ms
Lead Channel Pacing Threshold Pulse Width: 0.4 ms
Lead Channel Setting Pacing Amplitude: 1.1 V
Lead Channel Setting Pacing Amplitude: 2 V
Lead Channel Setting Pacing Pulse Width: 0.4 ms
Lead Channel Setting Sensing Sensitivity: 2.5 mV
Pulse Gen Serial Number: 118262

## 2018-12-20 ENCOUNTER — Encounter: Payer: Self-pay | Admitting: Internal Medicine

## 2018-12-21 NOTE — Progress Notes (Signed)
Remote pacemaker transmission.   

## 2018-12-28 ENCOUNTER — Ambulatory Visit (INDEPENDENT_AMBULATORY_CARE_PROVIDER_SITE_OTHER): Payer: Medicare Other | Admitting: Internal Medicine

## 2018-12-28 ENCOUNTER — Encounter: Payer: Self-pay | Admitting: Internal Medicine

## 2018-12-28 DIAGNOSIS — R413 Other amnesia: Secondary | ICD-10-CM

## 2018-12-28 DIAGNOSIS — F329 Major depressive disorder, single episode, unspecified: Secondary | ICD-10-CM | POA: Insufficient documentation

## 2018-12-28 DIAGNOSIS — F3289 Other specified depressive episodes: Secondary | ICD-10-CM | POA: Diagnosis not present

## 2018-12-28 DIAGNOSIS — F32A Depression, unspecified: Secondary | ICD-10-CM | POA: Insufficient documentation

## 2018-12-28 MED ORDER — SERTRALINE HCL 25 MG PO TABS: 25.0000 mg | ORAL_TABLET | Freq: Every day | ORAL | 5 refills | Status: DC

## 2018-12-28 NOTE — Progress Notes (Signed)
Virtual Visit via Video Note  I connected with Shaun James on 12/28/18 at 11:30 AM EDT by a video enabled telemedicine application and verified that I am speaking with the correct person using two identifiers.   I discussed the limitations of evaluation and management by telemedicine and the availability of in person appointments. The patient expressed understanding and agreed to proceed.  The patient is currently at home and I am in the office.  His daughter is with him and she provides most of the history because of his dementia and difficulty hearing.  No referring provider.    History of Present Illness: This is an acute visit for depression.  For the past few weeks he has been more depressed.  He has been weepy and crying a lot.  He has occasional mood swings.  Occasionally he will be anxious.  His appetite is normal.  Typically his sleep is very good and he sometimes naps during the day.  On occasion he will be restless at night.  He is not having any other concerning symptoms.  Because of everything that has happened with his wife and moving in with his daughter there is good reason to be depressed.  His daughter also thinks with the coronavirus situation being locked up in the house is also causing some depression.  She wondered about him being started on medication.  He is interested in having something that will help his mood.  There are no complaints of chest pain, palpitations, shortness of breath, fevers, chills, cold symptoms.  He is taking Aricept nightly and has no side effects.  His daughter states there may be a slight improvement in his memory.   Social History   Socioeconomic History  . Marital status: Married    Spouse name: Not on file  . Number of children: 4  . Years of education: Not on file  . Highest education level: Not on file  Occupational History  . Occupation: retired    Comment: retired Art gallery manager  Social Needs  . Financial resource strain:  Not on file  . Food insecurity:    Worry: Not on file    Inability: Not on file  . Transportation needs:    Medical: Not on file    Non-medical: Not on file  Tobacco Use  . Smoking status: Former Smoker    Packs/day: 0.50    Years: 15.00    Pack years: 7.50    Types: Cigarettes    Last attempt to quit: 09/13/1965    Years since quitting: 53.3  . Smokeless tobacco: Never Used  Substance and Sexual Activity  . Alcohol use: No    Alcohol/week: 0.0 standard drinks  . Drug use: No  . Sexual activity: Not Currently  Lifestyle  . Physical activity:    Days per week: Not on file    Minutes per session: Not on file  . Stress: Not on file  Relationships  . Social connections:    Talks on phone: Not on file    Gets together: Not on file    Attends religious service: Not on file    Active member of club or organization: Not on file    Attends meetings of clubs or organizations: Not on file    Relationship status: Not on file  Other Topics Concern  . Not on file  Social History Narrative   Moved from [place] in [place] alone; lives in 2 story home but stays downstairs  3 children / 1 other;      Observations/Objective: Appears well in NAD He does appear slightly depressed and started to cry at one time. No anxious mood or affect  Assessment and Plan:  See Problem List for Assessment and Plan of chronic medical problems.   Follow Up Instructions:    I discussed the assessment and treatment plan with the patient. The patient was provided an opportunity to ask questions and all were answered. The patient agreed with the plan and demonstrated an understanding of the instructions.   The patient was advised to call back or seek an in-person evaluation if the symptoms worsen or if the condition fails to improve as anticipated.    Binnie Rail, MD

## 2018-12-28 NOTE — Telephone Encounter (Signed)
Appointment scheduled.

## 2018-12-28 NOTE — Assessment & Plan Note (Signed)
Taking Aricept nightly No side effects, tolerating the medication well Possible slight improvement in his memory Discussed with his daughter that we can increase this to 10 mg if they wish-she can discuss it with her family and let me know

## 2018-12-28 NOTE — Assessment & Plan Note (Addendum)
This is a new diagnosis Over the past several months he has had tremendous change in his life-he is no longer with his wife and they are getting divorced, he has moved in with his daughter and now there is the coronavirus situation and he is cooped up in the house He has been weepy and has been crying frequently.  Occasionally there is some anxiety No physical symptoms, appetite normal sleep is good Agreed that he would benefit from the medication Start sertraline 25 mg nightly.  We can increase this after a few weeks if he does not have any side effects His daughter knows to call with any questions or concerns She also noticed that she can contact me in a few weeks if they feel he would benefit from a higher dose Discussed most frequent side effects

## 2018-12-31 ENCOUNTER — Encounter: Payer: Self-pay | Admitting: Internal Medicine

## 2019-01-01 MED ORDER — CHLORPROMAZINE HCL 25 MG PO TABS: 25.0000 mg | ORAL_TABLET | Freq: Three times a day (TID) | ORAL | 0 refills | Status: DC

## 2019-01-20 ENCOUNTER — Other Ambulatory Visit: Payer: Self-pay | Admitting: Internal Medicine

## 2019-01-31 ENCOUNTER — Telehealth: Payer: Self-pay | Admitting: Cardiology

## 2019-01-31 NOTE — Telephone Encounter (Signed)
Pt daughter called and stated that pt is having chest tightness and is very anxious. She stated that they sent a remote transmission. Transmission received. Informed her I would send message to RN and someone will call her back. Pt daughter verbalized understanding.

## 2019-01-31 NOTE — Telephone Encounter (Signed)
Spoke with daughter. Patient and his wife are divorcing and he had a court hearing which caused him anxiety. He has had tightness in his chest and has taken a Valium and a NTG with relief. He is feeling improved now.  He does have known CAD. Normal device function, no episodes. I offered in office appointment with EKG and evaluation.  His daughter would like to see how the next day or two goes and if his symptoms recur.  If he has recurrence, she will call back for an appointment.  ER precautions given.  Chanetta Marshall, NP 01/31/2019 12:29 PM

## 2019-02-09 MED ORDER — NITROGLYCERIN 0.4 MG SL SUBL: 0.4000 mg | SUBLINGUAL_TABLET | SUBLINGUAL | 3 refills | Status: AC | PRN

## 2019-02-09 NOTE — Telephone Encounter (Signed)
Pt's medication was sent to pt's pharmacy as requested. Confirmation received.  °

## 2019-03-15 ENCOUNTER — Ambulatory Visit (INDEPENDENT_AMBULATORY_CARE_PROVIDER_SITE_OTHER): Payer: Medicare Other | Admitting: *Deleted

## 2019-03-15 DIAGNOSIS — I495 Sick sinus syndrome: Secondary | ICD-10-CM

## 2019-03-15 LAB — CUP PACEART REMOTE DEVICE CHECK
Battery Remaining Longevity: 30 mo
Battery Remaining Percentage: 48 %
Brady Statistic RA Percent Paced: 46 %
Brady Statistic RV Percent Paced: 2 %
Date Time Interrogation Session: 20200702091800
Implantable Lead Implant Date: 20121214
Implantable Lead Implant Date: 20121214
Implantable Lead Location: 753859
Implantable Lead Location: 753860
Implantable Lead Model: 4135
Implantable Lead Model: 4136
Implantable Lead Serial Number: 29066260
Implantable Lead Serial Number: 29098858
Implantable Pulse Generator Implant Date: 20121214
Lead Channel Impedance Value: 537 Ohm
Lead Channel Impedance Value: 818 Ohm
Lead Channel Pacing Threshold Amplitude: 0.6 V
Lead Channel Pacing Threshold Pulse Width: 0.4 ms
Lead Channel Setting Pacing Amplitude: 1.1 V
Lead Channel Setting Pacing Amplitude: 2 V
Lead Channel Setting Pacing Pulse Width: 0.4 ms
Lead Channel Setting Sensing Sensitivity: 2.5 mV
Pulse Gen Serial Number: 118262

## 2019-03-22 ENCOUNTER — Encounter: Payer: Self-pay | Admitting: Cardiology

## 2019-03-22 NOTE — Progress Notes (Signed)
Remote pacemaker transmission.   

## 2019-03-28 ENCOUNTER — Encounter: Payer: Self-pay | Admitting: Internal Medicine

## 2019-03-30 ENCOUNTER — Encounter: Payer: Self-pay | Admitting: Family

## 2019-03-30 ENCOUNTER — Ambulatory Visit (INDEPENDENT_AMBULATORY_CARE_PROVIDER_SITE_OTHER): Payer: Medicare Other | Admitting: Family

## 2019-03-30 DIAGNOSIS — R159 Full incontinence of feces: Secondary | ICD-10-CM

## 2019-03-30 DIAGNOSIS — H1032 Unspecified acute conjunctivitis, left eye: Secondary | ICD-10-CM | POA: Diagnosis not present

## 2019-03-30 MED ORDER — TOBRAMYCIN 0.3 % OP SOLN: 1.0000 [drp] | Freq: Four times a day (QID) | OPHTHALMIC | 0 refills | Status: DC

## 2019-03-30 NOTE — Progress Notes (Signed)
Shaun James is a 85 y.o. male with the following history as recorded in EpicCare:  Patient Active Problem List   Diagnosis Date Noted  . Depression 12/28/2018  . Rash and nonspecific skin eruption 11/14/2018  . Facial fracture ([place]) 06/26/2018  . Physical deconditioning 06/26/2018  . Poor balance 06/26/2018  . Injury of face 04/29/2018  . Recurrent falls 04/29/2018  . Dizziness 04/18/2018  . Fall at home 04/18/2018  . Multiple facial fractures ([place]) 04/18/2018  . Memory difficulties 01/09/2018  . Urgency incontinence 01/09/2018  . Ulnar neuropathy of right upper extremity 12/19/2017  . Anxiety 12/19/2017  . Sleep difficulties 09/21/2017  . Acute left ankle pain 09/21/2017  . Subdural hemorrhage ([place]) 05/09/2017  . Subarachnoid hemorrhage ([place]) 05/09/2017  . Prediabetes 04/06/2017  . Nausea 10/26/2016  . Cervical radiculopathy at C8 10/08/2016  . Paroxysmal atrial fibrillation (HCC)   . Expressive aphasia 08/18/2016  . Numbness and tingling of right arm 08/17/2016  . AAA (abdominal aortic aneurysm) without rupture ([place]) 04/25/2016  . Pulmonary nodule 04/25/2016  . Bilateral hearing loss 03/22/2016  . Aneurysm, cerebral, nonruptured 03/11/2016  . Carotid artery stenosis 03/11/2016  . BPPV (benign paroxysmal positional vertigo) 03/10/2016  . Ataxia   . Burning tongue 08/12/2015  . GERD (gastroesophageal reflux disease) 08/12/2015  . BPH without urinary obstruction 04/10/2015  . 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 Chronic headache   . Osteoarthritis   . Cardiac pacemaker in situ 10/18/2011  . CAD (coronary artery disease), autologous vein bypass graft 09/13/2011  . Sick sinus syndrome ([place]) 09/13/2011  . Hyperlipidemia with target LDL less than 70 04/16/2008  . Acute myocardial infarction ([place]) 04/16/2008  . Pulmonary fibrosis ([place]) 04/16/2008    Current Outpatient Medications  Medication Sig Dispense Refill  . acetaminophen (TYLENOL) 500 MG tablet Take 1  tablet (500 mg total) by mouth daily as needed for headache. 14 tablet 0  . aspirin 325 MG tablet Take 325 mg by mouth daily.    Marland Kitchen CARTIA XT 120 MG 24 hr capsule TAKE 1 CAPSULE DAILY 90 capsule 3  . chlorproMAZINE (THORAZINE) 25 MG tablet Take 1 tablet (25 mg total) by mouth 3 (three) times daily. 30 tablet 0  . diazepam (VALIUM) 2 MG tablet Take 1 tablet (2 mg total) by mouth daily as needed for anxiety. 30 tablet 0  . donepezil (ARICEPT) 5 MG tablet Take 1 tablet (5 mg total) by mouth at bedtime. 90 tablet 1  . mirabegron ER (MYRBETRIQ) 25 MG TB24 tablet Take 1 tablet (25 mg total) by mouth daily. 30 tablet 5  . nitroGLYCERIN (NITROSTAT) 0.4 MG SL tablet Place 1 tablet (0.4 mg total) under the tongue every 5 (five) minutes as needed for chest pain. 25 tablet 3  . ondansetron (ZOFRAN-ODT) 4 MG disintegrating tablet DISSOLVE ONE TABLET ON TONGUE EVERY 4 HOURS PRN FOR NAUSEA 30 tablet 5  . sertraline (ZOLOFT) 25 MG tablet Take 1 tablet by mouth daily at bedtime. Need follow up for refills. 90 tablet 0  . simvastatin (ZOCOR) 20 MG tablet TAKE 1 TABLET EVERY EVENING 90 tablet 1  . tobramycin (TOBREX) 0.3 % ophthalmic solution Place 1 drop into the left eye every 6 (six) hours. Use x 5 days 5 mL 0   No current facility-administered medications for this visit.     Allergies: Codeine, Penicillin g, Penicillins, and Zyrtec [cetirizine hcl]  Past Medical History:  Diagnosis Date  . AAA (abdominal aortic aneurysm) (HCC)    3.1 cm  AAA, reevaluated 08/2009 per ultrasound - stable  . Allergic rhinitis   . Aortic sclerosis    Probable AS on physical exam, 2010  . Aortic stenosis    mild AS 2014 echo  . Arthritis   . Atrial fibrillation (HCC)    chronic anticoag  . Atrial flutter ([place])   . Basal cell carcinoma   . Benign positional vertigo   . BPH (benign prostatic hyperplasia)   . CAD in native artery    a) s/p CABG '98; LIMA-LAD, SVG-D1, SVG-OM1, SVG-PDA); b) CATH -2009: midLAD & RCA 100%, OM2  100% wtih severe native Cx; Grafts patent with ~30-50% SVG-D1 & ~30-40% SVG-OM, EF 45%; c) Lexiscan Cardiolite 02/2014: EF 61%, No Ischemia; subtle fixed anteroseptal defect.  . Cardiac pacemaker in [place]  . Coronary atherosclerosis of native coronary artery   . Diverticulosis of colon (without mention of hemorrhage)   . DJD (degenerative joint disease)   . Esophageal reflux   . Essential hypertension, benign   . GERD (gastroesophageal reflux disease)   . Headache    after brain aneurysm  . History of tuberculosis    remote Hx  . Inflamed seborrheic keratosis   . Intermittent confusion   . Internal hemorrhoids    severe  . Mixed hyperlipidemia   . Mouth burn   . Myocardial infarction ([place])   . Near syncope    uncertain cause. R/O arrhythmia, R/O med effect  . Osteoarthritis 06/14/2012  . Peptic ulcer, unspecified site, unspecified as acute or chronic, without mention of hemorrhage, perforation, or obstruction   . Personal history of colonic polyps   . Pneumonia   . Presbycusis of both ears    Bilateral hearing aids  . Presence of permanent cardiac pacemaker   . Prostatitis dx 12/2102   E coli Ucx  . SSS (sick sinus syndrome) (HCC)    syncope, s/p ppm 12/12  . Systolic murmur    Worrisome for AS. AS could cause exertional fatigue.    Past Surgical History:  Procedure Laterality Date  . CARDIAC CATHETERIZATION  2002  . cataracts     bilateral  . COLONOSCOPY    . CORONARY ARTERY BYPASS GRAFT  1997   (LIMA to LAD, SVG to diagonal-50% closed on catheterization in 1999, SVG to OM1, SVG to PDA) Repeat cath 2009 with patent grafts  . EXCISION OF TONGUE LESION    . EYE SURGERY Bilateral    cataract surgery with lens implant  . HEMORRHOID SURGERY  07/31/2010  . INSERT / REPLACE / REMOVE PACEMAKER  08/27/2011   PPM implant  . IR GENERIC HISTORICAL  04/06/2016   IR ANGIO VERTEBRAL SEL SUBCLAVIAN INNOMINATE UNI L MOD SED 04/06/2016 Luanne Bras, MD [facility]  . IR GENERIC HISTORICAL  04/06/2016   IR ANGIO VERTEBRAL SEL VERTEBRAL UNI R MOD SED 04/06/2016 Luanne Bras, MD [facility]  . IR GENERIC HISTORICAL  04/06/2016   IR ANGIO INTRA EXTRACRAN SEL COM CAROTID INNOMINATE BILAT MOD SED 04/06/2016 Luanne Bras, MD [facility]  . IR GENERIC HISTORICAL  05/19/2016   IR ANGIO INTRA EXTRACRAN SEL INTERNAL CAROTID UNI L MOD SED 05/19/2016 Luanne Bras, MD [facility]  . IR GENERIC HISTORICAL  05/19/2016   IR NEURO EACH ADD'L AFTER BASIC UNI LEFT (MS) 05/19/2016 Luanne Bras, MD [facility]  . IR GENERIC HISTORICAL  05/19/2016   IR ANGIOGRAM FOLLOW UP STUDY 05/19/2016 Luanne Bras, MD [facility]  . IR GENERIC HISTORICAL  05/19/2016   IR ANGIO VERTEBRAL SEL VERTEBRAL UNI L MOD SED 05/19/2016 Luanne Bras, MD [facility]  . IR GENERIC HISTORICAL  05/19/2016   IR TRANSCATH/EMBOLIZ 05/19/2016 Luanne Bras, MD [facility]  . IR GENERIC HISTORICAL  05/19/2016   IR 3D INDEPENDENT WKST 05/19/2016 Luanne Bras, MD [facility]  . IR GENERIC HISTORICAL  06/03/2016   IR RADIOLOGIST EVAL & MGMT 06/03/2016 [facility]  . IR GENERIC HISTORICAL  07/20/2016   IR RADIOLOGIST EVAL & MGMT 07/20/2016 [facility]  . left rotator cuff surgery  2000  . PERMANENT PACEMAKER INSERTION N/A 08/27/2011   Procedure: PERMANENT PACEMAKER INSERTION;  Surgeon: Evans Lance, MD;  [facility] C[facility] LAB;  Service: Cardiovascular;  Laterality: N/A;  . PUNCH BIOPSY OF SKIN  08/2009   5 mm punch biopsy on upper mid back melanotic appearing lesion  . RADIOLOGY WITH ANESTHESIA N/A 05/19/2016   Procedure: Embolization;  Surgeon: Luanne Bras, MD;  Location: [place];  Service: Radiology;  Laterality: N/A;    Family History  Problem Relation Age of Onset  . Other Father 74       [place]  . Heart attack Mother 63  . Heart disease Mother        ASHD  . COPD Brother   . Hypertension Brother   . Heart disease Brother        ASHD  . Heart disease  Brother        ASHD  . Heart disease Sister        ASHD  . COPD Brother   . Coronary artery disease Unknown     Social History   Tobacco Use  . Smoking status: Former Smoker    Packs/day: 0.50    Years: 15.00    Pack years: 7.50    Types: Cigarettes    Quit date: 09/13/1965    Years since quitting: 53.5  . Smokeless tobacco: Never Used  Substance Use Topics  . Alcohol use: No    Alcohol/week: 0.0 standard drinks    Subjective:    I connected with Shaun James on 03/30/19 at  2:20 PM EDT by a video enabled telemedicine application and verified that I am speaking with the correct person using two identifiers. Patient's daughter providers the majority of history due to patient's dementia and difficulty hearing;    I discussed the limitations of evaluation and management by telemedicine and the availability of in person appointments. The patient expressed understanding and agreed to proceed.  Daughter is concerned that father has eye infection in his left eye; complaining of drainage/ crusting- asking for antibiotic eye drop; Also mentions that he is having increased problems with bowel incontinence- this is not a new problem for patient; was wondering if there were any type of medication or something that could be done to help with the symptoms; daughter readily understands this is a fact of life however; states that her father is doing very well and she does feel the Zoloft start earlier this year has been very beneficial/ has been able to gain weight.    Objective:  There were no vitals filed for this visit.  General: Well developed, well nourished, in no acute distress  Head: Normocephalic and atraumatic  Eyes: Sclera and conjunctiva clear; pupils round and reactive to light; extraocular movements intact; redness noted on the screen Lungs: Respirations unlabored;  Neurologic: Alert and oriented; speech intact; face symmetrical; moves all extremities well; CNII-XII intact without  focal deficit  Assessment:  1. Acute conjunctivitis of left eye, unspecified acute conjunctivitis type   2. Incontinence of feces, unspecified fecal incontinence type     Plan:  1. Rx for Tobramycin Opht Solution 1 gtt OS q 6 hours prn; 2. Not a new problem for patient; explained there is not a simple treatment for this condition- they are comfortable with continuing to manage at home as they have been doing.   No follow-ups on file.  No orders of the defined types were placed in this encounter.   Requested Prescriptions   Signed Prescriptions Disp Refills  . tobramycin (TOBREX) 0.3 % ophthalmic solution 5 mL 0    Sig: Place 1 drop into the left eye every 6 (six) hours. Use x 5 days

## 2019-04-19 ENCOUNTER — Other Ambulatory Visit: Payer: Self-pay | Admitting: Internal Medicine

## 2019-05-10 ENCOUNTER — Other Ambulatory Visit: Payer: Self-pay | Admitting: Internal Medicine

## 2019-05-11 ENCOUNTER — Other Ambulatory Visit: Payer: Self-pay | Admitting: Internal Medicine

## 2019-05-14 ENCOUNTER — Encounter: Payer: Self-pay | Admitting: Internal Medicine

## 2019-05-16 ENCOUNTER — Ambulatory Visit: Payer: Medicare Other | Admitting: Internal Medicine

## 2019-05-16 DIAGNOSIS — N3281 Overactive bladder: Secondary | ICD-10-CM | POA: Insufficient documentation

## 2019-05-16 NOTE — Progress Notes (Signed)
Subjective:    Patient ID: Shaun James, male    DOB: 07/13/1931, 85 y.o.   MRN: [id number]  HPI The patient is here for follow up.  He is here with his daughter Shaun James.  She provides most of the history because of his dementia.  He is exercising regularly-rides his stationary bike and does exercise with bands.    Cough: For at least a couple of months he has been coughing throughout the day and night.  He does bring up some phlegm.  His daughter did see it today and it did appear to be clear.  He goes through boxes of tissue.  She did put him on Claritin for a while and there was no improvement.  There has been no shortness of breath, wheezing or fevers.  He denies any sinus pain.     GERD: He was taking omeprazole up until recently.  He has not had any GERD symptoms.  He is not taking it on a daily basis at this point.  CAD, PAfib, hyperlipidemia:  He is taking his medication daily. He is compliant with a low sodium diet.  He denies chest pain, palpitations, edema, shortness of breath and regular headaches.    Cerebral aneurysm s/p angioplasty with stent, then in stent stenosis and thrombosis with stroke.  S/p SDH, falls with TBI, possible NPH:  He is following with neurosurgery.  He was found to have left MCA moderate stenosis in two different places and was placed back on plavix and ASA 81 mg.  He is scheduled to have an ultrasound at this point to recheck the stent.  Dementia: His daughter states his memory is bad.  His short-term memory is very poor he is taking aricept and doing well with it.  She would like to increase this to 10 mg daily.  He has someone with him 24 x 7.    Depression, Anxiety: He is taking his sertraline daily and valium as needed as prescribed.  He has not needed the Valium in a long time.  He denies any side effects from the medication.  His daughter feels his depression and anxiety are well controlled.  Prediabetes:  He is compliant with a low sugar/carbohydrate  diet.  He is exercising regularly.  Overactive bladder:  He is taking myrbetriq daily.  There has been improvement in his bladder control.  Left eye -for the past 2 months he has had intermittent redness and irritation in the medial aspect of his left eye.  At times there is some drainage and the area does itch.  His eyelid is not involved.  There is no redness of his conjunctiva.  He was prescribed tobramycin eye drops and that did help, but several days after stopping it the symptoms return.       Medications and allergies reviewed with patient and updated if appropriate.  Patient Active Problem List   Diagnosis Date Noted   Overactive bladder 05/16/2019   Depression 12/28/2018   Rash and nonspecific skin eruption 11/14/2018   Facial fracture ([place]) 06/26/2018   Physical deconditioning 06/26/2018   Poor balance 06/26/2018   Recurrent falls 04/29/2018   Dizziness 04/18/2018   Fall at home 04/18/2018   Multiple facial fractures ([place]) 04/18/2018   Memory difficulties 01/09/2018   Urgency incontinence 01/09/2018   Ulnar neuropathy of right upper extremity 12/19/2017   Anxiety 12/19/2017   Sleep difficulties 09/21/2017   Acute left ankle pain 09/21/2017   Subdural hemorrhage ([place]) 05/09/2017  Subarachnoid hemorrhage ([place]) 05/09/2017   Prediabetes 04/06/2017   Nausea 10/26/2016   Cervical radiculopathy at C8 10/08/2016   Paroxysmal atrial fibrillation (HCC)    Expressive aphasia 08/18/2016   Numbness and tingling of right arm 08/17/2016   AAA (abdominal aortic aneurysm) without rupture ([place]) 04/25/2016   Pulmonary nodule 04/25/2016   Bilateral hearing loss 03/22/2016   Aneurysm, cerebral, nonruptured 03/11/2016   Carotid artery stenosis 03/11/2016   BPPV (benign paroxysmal positional vertigo) 03/10/2016   Ataxia    Burning tongue 08/12/2015   GERD (gastroesophageal reflux disease) 08/12/2015   BPH without urinary obstruction 04/10/2015    Pleural plaque without asbestos 07/17/2014   Chronic headache    Osteoarthritis    Cardiac pacemaker in situ 10/18/2011   CAD (coronary artery disease), autologous vein bypass graft 09/13/2011   Sick sinus syndrome ([place]) 09/13/2011   Hyperlipidemia with target LDL less than 70 04/16/2008   Acute myocardial infarction [facility]) 04/16/2008   Pulmonary fibrosis ([place]) 04/16/2008    Current Outpatient Medications on File Prior to Visit  Medication Sig Dispense Refill   acetaminophen (TYLENOL) 500 MG tablet Take 1 tablet (500 mg total) by mouth daily as needed for headache. 14 tablet 0   aspirin EC 81 MG tablet Take by mouth.     CARTIA XT 120 MG 24 hr capsule TAKE 1 CAPSULE DAILY 90 capsule 3   clopidogrel (PLAVIX) 75 MG tablet Take by mouth.     diazepam (VALIUM) 2 MG tablet Take 1 tablet (2 mg total) by mouth daily as needed for anxiety. 30 tablet 0   donepezil (ARICEPT) 5 MG tablet Take 1 tablet by mouth at bedtime. Need office visit for more refills. 30 tablet 0   mirabegron ER (MYRBETRIQ) 25 MG TB24 tablet Take 1 tablet (25 mg total) by mouth daily. 30 tablet 5   nitroGLYCERIN (NITROSTAT) 0.4 MG SL tablet Place 1 tablet (0.4 mg total) under the tongue every 5 (five) minutes as needed for chest pain. 25 tablet 3   omeprazole (PRILOSEC) 20 MG capsule Take by mouth.     ondansetron (ZOFRAN-ODT) 4 MG disintegrating tablet DISSOLVE ONE TABLET ON TONGUE EVERY 4 HOURS PRN FOR NAUSEA 30 tablet 5   sertraline (ZOLOFT) 25 MG tablet TAKE 1 TABLET BY MOUTH EVERYDAY AT BEDTIME. Need office visit for more refills. 30 tablet 0   simvastatin (ZOCOR) 20 MG tablet Take 1 tablet (20 mg total) by mouth every evening. Keep scheduled appt for future refills 90 tablet 0   No current facility-administered medications on file prior to visit.     Past Medical History:  Diagnosis Date   AAA (abdominal aortic aneurysm) (HCC)    3.1 cm AAA, reevaluated 08/2009 per ultrasound - stable   Allergic  rhinitis    Aortic sclerosis    Probable AS on physical exam, 2010   Aortic stenosis    mild AS 2014 echo   Arthritis    Atrial fibrillation (HCC)    chronic anticoag   Atrial flutter (HCC)    Basal cell carcinoma    Benign positional vertigo    BPH (benign prostatic hyperplasia)    CAD in native artery    a) s/p CABG '98; LIMA-LAD, SVG-D1, SVG-OM1, SVG-PDA); b) CATH -2009: midLAD & RCA 100%, OM2 100% wtih severe native Cx; Grafts patent with ~30-50% SVG-D1 & ~30-40% SVG-OM, EF 45%; c) Lexiscan Cardiolite 02/2014: EF 61%, No Ischemia; subtle fixed anteroseptal defect.   Cardiac pacemaker in [place]  Coronary atherosclerosis of native coronary artery    Diverticulosis of colon (without mention of hemorrhage)    DJD (degenerative joint disease)    Esophageal reflux    Essential hypertension, benign    GERD (gastroesophageal reflux disease)    Headache    after brain aneurysm   History of tuberculosis    remote Hx   Inflamed seborrheic keratosis    Intermittent confusion    Internal hemorrhoids    severe   Mixed hyperlipidemia    Mouth burn    Myocardial infarction [facility])    Near syncope    uncertain cause. R/O arrhythmia, R/O med effect   Osteoarthritis 06/14/2012   Peptic ulcer, unspecified site, unspecified as acute or chronic, without mention of hemorrhage, perforation, or obstruction    Personal history of colonic polyps    Pneumonia    Presbycusis of both ears    Bilateral hearing aids   Presence of permanent cardiac pacemaker    Prostatitis dx 12/2102   E coli Ucx   SSS (sick sinus syndrome) ([place])    syncope, s/p ppm 99991111   Systolic murmur    Worrisome for AS. AS could cause exertional fatigue.    Past Surgical History:  Procedure Laterality Date   CARDIAC CATHETERIZATION  2002   cataracts     bilateral   COLONOSCOPY     CORONARY ARTERY BYPASS GRAFT  1997   (LIMA to LAD, SVG to diagonal-50% closed on  catheterization in 1999, SVG to OM1, SVG to PDA) Repeat cath 2009 with patent grafts   EXCISION OF TONGUE LESION     EYE SURGERY Bilateral    cataract surgery with lens implant   HEMORRHOID SURGERY  07/31/2010   INSERT / REPLACE / REMOVE PACEMAKER  08/27/2011   PPM implant   IR GENERIC HISTORICAL  04/06/2016   IR ANGIO VERTEBRAL SEL SUBCLAVIAN INNOMINATE UNI L MOD SED 04/06/2016 Luanne Bras, MD [facility]   IR GENERIC HISTORICAL  04/06/2016   IR ANGIO VERTEBRAL SEL VERTEBRAL UNI R MOD SED 04/06/2016 Luanne Bras, MD [facility]   IR GENERIC HISTORICAL  04/06/2016   IR ANGIO INTRA EXTRACRAN SEL COM CAROTID INNOMINATE BILAT MOD SED 04/06/2016 Luanne Bras, MD [facility]   IR GENERIC HISTORICAL  05/19/2016   IR ANGIO INTRA EXTRACRAN SEL INTERNAL CAROTID UNI L MOD SED 05/19/2016 Luanne Bras, MD [facility]   IR GENERIC HISTORICAL  05/19/2016   IR NEURO EACH ADD'L AFTER BASIC UNI LEFT (MS) 05/19/2016 Luanne Bras, MD [facility]   IR GENERIC HISTORICAL  05/19/2016   IR ANGIOGRAM FOLLOW UP STUDY 05/19/2016 Luanne Bras, MD [facility]   IR GENERIC HISTORICAL  05/19/2016   IR ANGIO VERTEBRAL SEL VERTEBRAL UNI L MOD SED 05/19/2016 Luanne Bras, MD [facility]   IR GENERIC HISTORICAL  05/19/2016   IR TRANSCATH/EMBOLIZ 05/19/2016 Luanne Bras, MD [facility]   IR GENERIC HISTORICAL  05/19/2016   IR 3D INDEPENDENT WKST 05/19/2016 Luanne Bras, MD [facility]   IR GENERIC HISTORICAL  06/03/2016   IR RADIOLOGIST EVAL & MGMT 06/03/2016 [facility]   IR GENERIC HISTORICAL  07/20/2016   IR RADIOLOGIST EVAL & MGMT 07/20/2016 [facility]   left rotator cuff surgery  2000   PERMANENT PACEMAKER INSERTION N/A 08/27/2011   Procedure: PERMANENT PACEMAKER INSERTION;  Surgeon: Evans Lance, MD;  Location[facility] CATH LAB;  Service: Cardiovascular;  Laterality: N/A;   PUNCH BIOPSY OF SKIN  08/2009   5 mm punch biopsy on  upper mid back melanotic appearing  lesion   RADIOLOGY WITH ANESTHESIA N/A 05/19/2016   Procedure: Embolization;  Surgeon: Luanne Bras, MD;  Location: [place];  Service: Radiology;  Laterality: N/A;    Social History   Socioeconomic History   Marital status: Married    Spouse name: Not on file   Number of children: 4   Years of education: Not on file   Highest education level: Not on file  Occupational History   Occupation: retired    Comment: retired Art gallery manager  Social Designer, fashion/clothing strain: Not on file   Food insecurity    Worry: Not on file    Inability: Not on file   Transportation needs    Medical: Not on file    Non-medical: Not on file  Tobacco Use   Smoking status: Former Smoker    Packs/day: 0.50    Years: 15.00    Pack years: 7.50    Types: Cigarettes    Quit date: 09/13/1965    Years since quitting: 53.7   Smokeless tobacco: Never Used  Substance and Sexual Activity   Alcohol use: No    Alcohol/week: 0.0 standard drinks   Drug use: No   Sexual activity: Not Currently  Lifestyle   Physical activity    Days per week: Not on file    Minutes per session: Not on file   Stress: Not on file  Relationships   Social connections    Talks on phone: Not on file    Gets together: Not on file    Attends religious service: Not on file    Active member of club or organization: Not on file    Attends meetings of clubs or organizations: Not on file    Relationship status: Not on file  Other Topics Concern   Not on file  Social History Narrative   Moved from [place] in [place] alone; lives in 2 story home but stays downstairs   3 children / 1 other;     Family History  Problem Relation Age of Onset   Other Father 70       [place] attack Mother 21   Heart disease Mother        ASHD   COPD Brother    Hypertension Brother    Heart disease Brother        ASHD   Heart disease Brother        ASHD   Heart disease Sister        ASHD     COPD Brother    Coronary artery disease Unknown     Review of Systems  Constitutional: Positive for fatigue (with moderate exertion). Negative for chills and fever.  Eyes: Positive for redness and itching. Negative for visual disturbance.  Respiratory: Positive for cough. Negative for shortness of breath and wheezing.   Cardiovascular: Negative for chest pain, palpitations and leg swelling.  Endocrine: Positive for cold intolerance.  Neurological: Positive for light-headedness (when he gets up) and headaches (sometimes in the morning).       Objective:   Vitals:   05/17/19 1100  BP: 126/74  Pulse: 78  Temp: 97.6 F (36.4 C)  SpO2: 96%   BP Readings from Last 3 Encounters:  05/17/19 126/74  11/14/18 124/72  06/26/18 (!) 144/72   Wt Readings from Last 3 Encounters:  05/17/19 117 lb 12.8 oz (53.4 kg)  11/14/18 122 lb 3.2 oz (55.4 kg)  06/26/18 130 lb 12.8 oz (59.3 kg)   Body mass index is 20.87 kg/m.   Physical Exam    Constitutional: Appears well-developed and well-nourished. No distress.  HENT:  Head: Normocephalic and atraumatic.  Neck: Neck supple. No tracheal deviation present. No thyromegaly present.  No cervical lymphadenopathy Eyes: Bilateral conjunctive a clear.  Left medial eye near tear duct slightly swollen and more erythematous.  No active discharge.  No eyelid swelling or erythema Cardiovascular: Normal rate, regular rhythm and normal heart sounds.  2/6 systolic murmur heard. No carotid bruit .  No edema Pulmonary/Chest: Effort normal normal. No respiratory distress. No has no wheezes.  Mild bibasilar dry crackles-right side more than left Abdomen: Soft, nontender, nondistended Skin: Skin is warm and dry. Not diaphoretic.  Psychiatric: Normal mood and affect. Behavior is normal.      Assessment & Plan:    See Problem List for Assessment and Plan of chronic medical problems.

## 2019-05-17 ENCOUNTER — Other Ambulatory Visit: Payer: Self-pay

## 2019-05-17 ENCOUNTER — Other Ambulatory Visit (INDEPENDENT_AMBULATORY_CARE_PROVIDER_SITE_OTHER): Payer: Medicare Other

## 2019-05-17 ENCOUNTER — Ambulatory Visit (INDEPENDENT_AMBULATORY_CARE_PROVIDER_SITE_OTHER)
Admission: RE | Admit: 2019-05-17 | Discharge: 2019-05-17 | Disposition: A | Discharge status: Home / Self Care | Payer: Medicare Other | Source: Ambulatory Visit | Attending: Internal Medicine | Admitting: Internal Medicine

## 2019-05-17 ENCOUNTER — Ambulatory Visit (INDEPENDENT_AMBULATORY_CARE_PROVIDER_SITE_OTHER): Payer: Medicare Other | Admitting: Internal Medicine

## 2019-05-17 ENCOUNTER — Encounter: Payer: Self-pay | Admitting: Internal Medicine

## 2019-05-17 VITALS — BP 126/74 | HR 78 | Temp 97.6°F | Ht 63.0 in | Wt 117.8 lb

## 2019-05-17 DIAGNOSIS — I480 Paroxysmal atrial fibrillation: Secondary | ICD-10-CM

## 2019-05-17 DIAGNOSIS — F419 Anxiety disorder, unspecified: Secondary | ICD-10-CM

## 2019-05-17 DIAGNOSIS — R7303 Prediabetes: Secondary | ICD-10-CM | POA: Diagnosis not present

## 2019-05-17 DIAGNOSIS — R059 Cough, unspecified: Secondary | ICD-10-CM

## 2019-05-17 DIAGNOSIS — R413 Other amnesia: Secondary | ICD-10-CM

## 2019-05-17 DIAGNOSIS — N3281 Overactive bladder: Secondary | ICD-10-CM

## 2019-05-17 DIAGNOSIS — Z23 Encounter for immunization: Secondary | ICD-10-CM

## 2019-05-17 DIAGNOSIS — H04302 Unspecified dacryocystitis of left lacrimal passage: Secondary | ICD-10-CM | POA: Insufficient documentation

## 2019-05-17 DIAGNOSIS — E785 Hyperlipidemia, unspecified: Secondary | ICD-10-CM

## 2019-05-17 DIAGNOSIS — R05 Cough: Secondary | ICD-10-CM

## 2019-05-17 DIAGNOSIS — I25718 Atherosclerosis of autologous vein coronary artery bypass graft(s) with other forms of angina pectoris: Secondary | ICD-10-CM

## 2019-05-17 DIAGNOSIS — K219 Gastro-esophageal reflux disease without esophagitis: Secondary | ICD-10-CM

## 2019-05-17 DIAGNOSIS — F3289 Other specified depressive episodes: Secondary | ICD-10-CM

## 2019-05-17 LAB — CBC WITH DIFFERENTIAL/PLATELET
Basophils Absolute: 0.1 10*3/uL (ref 0.0–0.1)
Basophils Relative: 1.3 % (ref 0.0–3.0)
Eosinophils Absolute: 0.2 10*3/uL (ref 0.0–0.7)
Eosinophils Relative: 3.3 % (ref 0.0–5.0)
HCT: 44.3 % (ref 39.0–52.0)
Hemoglobin: 14.9 g/dL (ref 13.0–17.0)
Lymphocytes Relative: 22 % (ref 12.0–46.0)
Lymphs Abs: 1.5 10*3/uL (ref 0.7–4.0)
MCHC: 33.5 g/dL (ref 30.0–36.0)
MCV: 90.5 fl (ref 78.0–100.0)
Monocytes Absolute: 0.6 10*3/uL (ref 0.1–1.0)
Monocytes Relative: 9.4 % (ref 3.0–12.0)
Neutro Abs: 4.3 10*3/uL (ref 1.4–7.7)
Neutrophils Relative %: 64 % (ref 43.0–77.0)
Platelets: 235 10*3/uL (ref 150.0–400.0)
RBC: 4.9 Mil/uL (ref 4.22–5.81)
RDW: 14.4 % (ref 11.5–15.5)
WBC: 6.8 10*3/uL (ref 4.0–10.5)

## 2019-05-17 LAB — COMPREHENSIVE METABOLIC PANEL
ALT: 12 U/L (ref 0–53)
AST: 18 U/L (ref 0–37)
Albumin: 4.3 g/dL (ref 3.5–5.2)
Alkaline Phosphatase: 82 U/L (ref 39–117)
BUN: 13 mg/dL (ref 6–23)
CO2: 31 mEq/L (ref 19–32)
Calcium: 9.6 mg/dL (ref 8.4–10.5)
Chloride: 100 mEq/L (ref 96–112)
Creatinine, Ser: 0.79 mg/dL (ref 0.40–1.50)
GFR: 92.98 mL/min (ref >60.00)
Glucose, Bld: 91 mg/dL (ref 70–99)
Potassium: 3.7 mEq/L (ref 3.5–5.1)
Sodium: 137 mEq/L (ref 135–145)
Total Bilirubin: 0.6 mg/dL (ref 0.2–1.2)
Total Protein: 7.3 g/dL (ref 6.0–8.3)

## 2019-05-17 LAB — LIPID PANEL
Cholesterol: 170 mg/dL (ref 0–200)
HDL: 44.9 mg/dL (ref >39.00)
LDL Cholesterol: 87 mg/dL (ref 0–99)
NonHDL: 125.06
Total CHOL/HDL Ratio: 4
Triglycerides: 188 mg/dL — ABNORMAL HIGH (ref 0.0–149.0)
VLDL: 37.6 mg/dL (ref 0.0–40.0)

## 2019-05-17 LAB — TSH: TSH: 2.07 u[IU]/mL (ref 0.35–4.50)

## 2019-05-17 LAB — VITAMIN B12: Vitamin B-12: 396 pg/mL (ref 211–911)

## 2019-05-17 LAB — HEMOGLOBIN A1C: Hgb A1c MFr Bld: 5.8 % (ref 4.6–6.5)

## 2019-05-17 MED ORDER — MIRABEGRON ER 25 MG PO TB24: 25.0000 mg | ORAL_TABLET | Freq: Every day | ORAL | 1 refills | Status: DC

## 2019-05-17 MED ORDER — DONEPEZIL HCL 10 MG PO TABS
10.0000 mg | ORAL_TABLET | Freq: Every day | ORAL | 1 refills | Status: DC
Start: 2019-05-17 — End: 2019-12-12

## 2019-05-17 MED ORDER — DILTIAZEM HCL ER COATED BEADS 120 MG PO CP24: 120.0000 mg | ORAL_CAPSULE | Freq: Every day | ORAL | 3 refills | Status: AC

## 2019-05-17 MED ORDER — SIMVASTATIN 20 MG PO TABS: 20.0000 mg | ORAL_TABLET | Freq: Every evening | ORAL | 1 refills | Status: DC

## 2019-05-17 MED ORDER — SERTRALINE HCL 25 MG PO TABS: ORAL_TABLET | ORAL | 1 refills | Status: DC

## 2019-05-17 MED ORDER — CEPHALEXIN 500 MG PO CAPS: 500.0000 mg | ORAL_CAPSULE | Freq: Three times a day (TID) | ORAL | 0 refills | Status: DC

## 2019-05-17 NOTE — Assessment & Plan Note (Signed)
Well-controlled Continue sertraline daily

## 2019-05-17 NOTE — Assessment & Plan Note (Signed)
A1c

## 2019-05-17 NOTE — Assessment & Plan Note (Signed)
Has had a cough for the past couple of months that is productive.  The cough appears to be clear, but he is not able to provide any history if there is any discoloration or not Allergy medication-Claritin did not help No symptoms of GERD and was on medication up until recently Will get a chest x-ray Keflex 3 times daily x10 days

## 2019-05-17 NOTE — Assessment & Plan Note (Signed)
Improved with Myrbetriq daily Continue

## 2019-05-17 NOTE — Assessment & Plan Note (Signed)
GERD controlled Recently stopped taking the omeprazole 20 mg daily Can take omeprazole if needed

## 2019-05-17 NOTE — Assessment & Plan Note (Signed)
No concerning symptoms consistent with angina On aspirin 81 mg, Plavix Continue simvastatin Blood pressure well controlled CMP, CBC, lipid panel, TSH

## 2019-05-17 NOTE — Assessment & Plan Note (Signed)
Heart rate well controlled, no concerning symptoms On Plavix, aspirin 81 mg On diltiazem CBC, CMP, TSH

## 2019-05-17 NOTE — Assessment & Plan Note (Signed)
Possible clogged tear duct with infection on left Tobramycin eyedrops have helped, but symptoms recur Keflex 3 times daily x10 days Warm compresses Can use tobramycin eyedrops as needed, but if symptoms continue should see ophthalmologist

## 2019-05-17 NOTE — Assessment & Plan Note (Signed)
Short-term memory has gotten worse Continue Aricept, but will increase to 10 mg at bedtime Has around-the-clock care and overall doing very well Continue regular exercise

## 2019-05-17 NOTE — Patient Instructions (Addendum)
  Tests ordered today. Your results will be released to [place] (or called to you) after review.  If any changes need to be made, you will be notified at that same time.  Flu immunization administered today.    Medications reviewed and updated.  Changes include :   Start keflex ( antibitoic) and increase aricept.  Your prescription(s) have been submitted to your pharmacy. Please take as directed and contact our office if you believe you are having problem(s) with the medication(s).   Please followup in 8 months

## 2019-05-17 NOTE — Assessment & Plan Note (Signed)
Continue simvastatin Check lipid panel, CMP

## 2019-05-18 ENCOUNTER — Encounter: Payer: Self-pay | Admitting: Internal Medicine

## 2019-05-18 ENCOUNTER — Ambulatory Visit: Payer: Medicare Other | Admitting: Internal Medicine

## 2019-06-04 ENCOUNTER — Other Ambulatory Visit: Payer: Self-pay | Admitting: Internal Medicine

## 2019-06-14 ENCOUNTER — Ambulatory Visit (INDEPENDENT_AMBULATORY_CARE_PROVIDER_SITE_OTHER): Payer: Medicare Other | Admitting: *Deleted

## 2019-06-14 DIAGNOSIS — I495 Sick sinus syndrome: Secondary | ICD-10-CM

## 2019-06-14 LAB — CUP PACEART REMOTE DEVICE CHECK
Battery Remaining Longevity: 24 mo
Battery Remaining Percentage: 42 %
Brady Statistic RA Percent Paced: 51 %
Brady Statistic RV Percent Paced: 2 %
Date Time Interrogation Session: 20201001085200
Implantable Lead Implant Date: 20121214
Implantable Lead Implant Date: 20121214
Implantable Lead Location: 753859
Implantable Lead Location: 753860
Implantable Lead Model: 4135
Implantable Lead Model: 4136
Implantable Lead Serial Number: 29066260
Implantable Lead Serial Number: 29098858
Implantable Pulse Generator Implant Date: 20121214
Lead Channel Impedance Value: 540 Ohm
Lead Channel Impedance Value: 818 Ohm
Lead Channel Pacing Threshold Amplitude: 0.6 V
Lead Channel Pacing Threshold Pulse Width: 0.4 ms
Lead Channel Setting Pacing Amplitude: 1.1 V
Lead Channel Setting Pacing Amplitude: 2 V
Lead Channel Setting Pacing Pulse Width: 0.4 ms
Lead Channel Setting Sensing Sensitivity: 2.5 mV
Pulse Gen Serial Number: 118262

## 2019-06-15 ENCOUNTER — Telehealth: Payer: Self-pay | Admitting: Internal Medicine

## 2019-06-15 NOTE — Telephone Encounter (Signed)
Home Health Verbal Orders - Caller/Agency: [facility] Callback Number: [phone] secure line Requesting OT/PT/Skilled Nursing/Social Work/Speech Therapy: PT and evaluation for nursing and OT Frequency: PT: 2x for 4wks.

## 2019-06-15 NOTE — Telephone Encounter (Signed)
Gave ok for orders per Dr. Burns.  

## 2019-06-20 DIAGNOSIS — S065X9S Traumatic subdural hemorrhage with loss of consciousness of unspecified duration, sequela: Secondary | ICD-10-CM

## 2019-06-20 DIAGNOSIS — G629 Polyneuropathy, unspecified: Secondary | ICD-10-CM

## 2019-06-20 DIAGNOSIS — Z8673 Personal history of transient ischemic attack (TIA), and cerebral infarction without residual deficits: Secondary | ICD-10-CM

## 2019-06-20 DIAGNOSIS — G912 (Idiopathic) normal pressure hydrocephalus: Secondary | ICD-10-CM | POA: Diagnosis not present

## 2019-06-20 DIAGNOSIS — Z87891 Personal history of nicotine dependence: Secondary | ICD-10-CM

## 2019-06-20 DIAGNOSIS — M6281 Muscle weakness (generalized): Secondary | ICD-10-CM

## 2019-06-20 DIAGNOSIS — R279 Unspecified lack of coordination: Secondary | ICD-10-CM

## 2019-06-20 DIAGNOSIS — I4891 Unspecified atrial fibrillation: Secondary | ICD-10-CM

## 2019-06-20 DIAGNOSIS — F0390 Unspecified dementia without behavioral disturbance: Secondary | ICD-10-CM

## 2019-06-20 DIAGNOSIS — R2689 Other abnormalities of gait and mobility: Secondary | ICD-10-CM

## 2019-06-20 DIAGNOSIS — Z9181 History of falling: Secondary | ICD-10-CM

## 2019-06-21 ENCOUNTER — Encounter: Payer: Self-pay | Admitting: Cardiology

## 2019-06-21 NOTE — Progress Notes (Signed)
Remote pacemaker transmission.   

## 2019-06-26 ENCOUNTER — Telehealth: Payer: Self-pay

## 2019-06-26 NOTE — Telephone Encounter (Signed)
Called to set up possible evisit 06/27/2019 [facility]

## 2019-08-08 ENCOUNTER — Other Ambulatory Visit: Payer: Self-pay | Admitting: Internal Medicine

## 2019-08-08 ENCOUNTER — Encounter: Payer: Medicare Other | Admitting: Internal Medicine

## 2019-08-08 ENCOUNTER — Encounter: Payer: Self-pay | Admitting: Internal Medicine

## 2019-08-08 NOTE — Progress Notes (Signed)
Virtual Visit via Video Note  I connected with Shaun James on 08/08/19 at  1:45 PM EST by a video enabled telemedicine application and verified that I am speaking with the correct person using two identifiers.   I discussed the limitations of evaluation and management by telemedicine and the availability of in person appointments. The patient expressed understanding and agreed to proceed.  Present for the visit:  Myself, Dr Billey Gosling, [name].  The patient is currently at home and I am in the office.    No referring provider.    History of Present Illness: This is an acute visit for rash.     Social History   Socioeconomic History  . Marital status: Married    Spouse name: Not on file  . Number of children: 4  . Years of education: Not on file  . Highest education level: Not on file  Occupational History  . Occupation: retired    Comment: retired Art gallery manager  Social Needs  . Financial resource strain: Not on file  . Food insecurity    Worry: Not on file    Inability: Not on file  . Transportation needs    Medical: Not on file    Non-medical: Not on file  Tobacco Use  . Smoking status: Former Smoker    Packs/day: 0.50    Years: 15.00    Pack years: 7.50    Types: Cigarettes    Quit date: 09/13/1965    Years since quitting: 53.9  . Smokeless tobacco: Never Used  Substance and Sexual Activity  . Alcohol use: No    Alcohol/week: 0.0 standard drinks  . Drug use: No  . Sexual activity: Not Currently  Lifestyle  . Physical activity    Days per week: Not on file    Minutes per session: Not on file  . Stress: Not on file  Relationships  . Social Herbalist on phone: Not on file    Gets together: Not on file    Attends religious service: Not on file    Active member of club or organization: Not on file    Attends meetings of clubs or organizations: Not on file    Relationship status: Not on file  Other Topics Concern  . Not on file  Social  History Narrative   Moved from [place] in [place] alone; lives in 2 story home but stays downstairs   3 children / 1 other;      Observations/Objective: Appears well in NAD   Assessment and Plan:  See Problem List for Assessment and Plan of chronic medical problems.   Follow Up Instructions:    I discussed the assessment and treatment plan with the patient. The patient was provided an opportunity to ask questions and all were answered. The patient agreed with the plan and demonstrated an understanding of the instructions.   The patient was advised to call back or seek an in-person evaluation if the symptoms worsen or if the condition fails to improve as anticipated.    Binnie Rail, MD  This encounter was created in error - please disregard.

## 2019-09-03 ENCOUNTER — Encounter: Payer: Self-pay | Admitting: Internal Medicine

## 2019-09-13 ENCOUNTER — Ambulatory Visit (INDEPENDENT_AMBULATORY_CARE_PROVIDER_SITE_OTHER): Payer: Medicare Other | Admitting: *Deleted

## 2019-09-13 DIAGNOSIS — I495 Sick sinus syndrome: Secondary | ICD-10-CM | POA: Diagnosis not present

## 2019-09-15 LAB — CUP PACEART REMOTE DEVICE CHECK
Battery Remaining Longevity: 24 mo
Battery Remaining Percentage: 37 %
Brady Statistic RA Percent Paced: 55 %
Brady Statistic RV Percent Paced: 2 %
Date Time Interrogation Session: 20210101045100
Implantable Lead Implant Date: 20121214
Implantable Lead Implant Date: 20121214
Implantable Lead Location: 753859
Implantable Lead Location: 753860
Implantable Lead Model: 4135
Implantable Lead Model: 4136
Implantable Lead Serial Number: 29066260
Implantable Lead Serial Number: 29098858
Implantable Pulse Generator Implant Date: 20121214
Lead Channel Impedance Value: 528 Ohm
Lead Channel Impedance Value: 698 Ohm
Lead Channel Pacing Threshold Amplitude: 0.6 V
Lead Channel Pacing Threshold Amplitude: 1 V
Lead Channel Pacing Threshold Pulse Width: 0.4 ms
Lead Channel Pacing Threshold Pulse Width: 0.4 ms
Lead Channel Setting Pacing Amplitude: 1.1 V
Lead Channel Setting Pacing Amplitude: 2 V
Lead Channel Setting Pacing Pulse Width: 0.4 ms
Lead Channel Setting Sensing Sensitivity: 2.5 mV
Pulse Gen Serial Number: 118262

## 2019-09-24 ENCOUNTER — Encounter: Payer: Self-pay | Admitting: Internal Medicine

## 2019-09-26 ENCOUNTER — Encounter: Payer: Self-pay | Admitting: Internal Medicine

## 2019-09-26 ENCOUNTER — Ambulatory Visit (INDEPENDENT_AMBULATORY_CARE_PROVIDER_SITE_OTHER): Payer: Medicare Other | Admitting: Internal Medicine

## 2019-09-26 DIAGNOSIS — R5383 Other fatigue: Secondary | ICD-10-CM | POA: Diagnosis not present

## 2019-09-26 NOTE — Progress Notes (Signed)
Virtual Visit via Video Note  I connected with Shaun James on 09/26/19 at  2:30 PM EST by a video enabled telemedicine application and verified that I am speaking with the correct person using two identifiers.   I discussed the limitations of evaluation and management by telemedicine and the availability of in person appointments. The patient expressed understanding and agreed to proceed.  Present for the visit:  Myself, Dr Billey Gosling, [name] and his daughter Shaun James.  The patient is currently at home and I am in the office.  His daughter provides the history because of his dementia.  No referring provider.    History of Present Illness: This is an acute visit for fatigue.  For the past 2 weeks his daughter has noticed that he is sleeping more and has a decreased appetite.  They do have someone coming in daily to make sure he is getting up and exercising.  He can someday sleep all day and may even sleep at all night.  Some nights he is up intermittently.  Some days he is wanted to go to bed before dinner and will be sleeping very hard and she has to wake him up for dinner.  She wanted to know if this was something she needed to be concerned about.  There have been no changes in his medications.  He is not complaining of anything.  She does not feel that he is depressed-he is not breaking down or crying.  He will intermittently complain of headaches, but that is nothing new.  He is not complaining of any urinary symptoms, abdominal pain, cold symptoms.  She states he typically will complain of something if it is bothering him.  There has not been a significant change in weight.     Review of Systems  Constitutional: Positive for malaise/fatigue. Negative for chills and fever.  HENT: Negative for sinus pain and sore throat.   Respiratory: Negative for cough, shortness of breath and wheezing.   Cardiovascular: Negative for chest pain, palpitations and leg swelling.  Gastrointestinal: Negative  for abdominal pain.  Genitourinary: Negative for dysuria and frequency.  Neurological: Positive for headaches.  Psychiatric/Behavioral: Negative for depression.     Social History   Socioeconomic History  . Marital status: Married    Spouse name: Not on file  . Number of children: 4  . Years of education: Not on file  . Highest education level: Not on file  Occupational History  . Occupation: retired    Comment: retired Art gallery manager  Tobacco Use  . Smoking status: Former Smoker    Packs/day: 0.50    Years: 15.00    Pack years: 7.50    Types: Cigarettes    Quit date: 09/13/1965    Years since quitting: 54.0  . Smokeless tobacco: Never Used  Substance and Sexual Activity  . Alcohol use: No    Alcohol/week: 0.0 standard drinks  . Drug use: No  . Sexual activity: Not Currently  Other Topics Concern  . Not on file  Social History Narrative   Moved from [place] in [place] alone; lives in 2 story home but stays downstairs   3 children / 1 other;    Social Determinants of Health   Financial Resource Strain:   . Difficulty of Paying Living Expenses: Not on file  Food Insecurity:   . Worried About Charity fundraiser in the Last Year: Not on file  . Ran Out of Food in the Last  Year: Not on file  Transportation Needs:   . Lack of Transportation (Medical): Not on file  . Lack of Transportation (Non-Medical): Not on file  Physical Activity:   . Days of Exercise per Week: Not on file  . Minutes of Exercise per Session: Not on file  Stress:   . Feeling of Stress : Not on file  Social Connections:   . Frequency of Communication with Friends and Family: Not on file  . Frequency of Social Gatherings with Friends and Family: Not on file  . Attends Religious Services: Not on file  . Active Member of Clubs or Organizations: Not on file  . Attends Archivist Meetings: Not on file  . Marital Status: Not on file     Observations/Objective: Appears well in  NAD Breathing normally Skin appears warm and dry Mood and affect appears normal  Assessment and Plan:  See Problem List for Assessment and Plan of chronic medical problems.   Follow Up Instructions:    I discussed the assessment and treatment plan with the patient. The patient was provided an opportunity to ask questions and all were answered. The patient agreed with the plan and demonstrated an understanding of the instructions.   The patient was advised to call back or seek an in-person evaluation if the symptoms worsen or if the condition fails to improve as anticipated.    Binnie Rail, MD

## 2019-09-26 NOTE — Assessment & Plan Note (Signed)
2 weeks of increased sleeping and possibly some decrease in appetite No obvious depression No changes in medications No cold symptoms or concerning symptoms to indicate a possible infection ?  Related to dementia getting worse.  She has seen a decline in his memory over time Discussed that we could consider blood work to rule out other causes, but I am not hearing anything concerning It is difficult to know for sure if this is related to his dementia worsening or not without ruling out other causes out, but most likely it sounds like it could be related to his dementia She wondered about hospice, but I do not think he is eligible at this point since he is up and about and still exercising regularly Discussed possible palliative care consult who could also evaluate him for hospice-she will think about it and let me know Given Covid pandemic will defer blood work for now She will keep me updated and let me know if there are any changes and monitor for now

## 2019-10-03 ENCOUNTER — Telehealth: Payer: Self-pay | Admitting: Internal Medicine

## 2019-10-03 NOTE — Telephone Encounter (Signed)
Patient's lawyer Gwendolyn Grant calling states that patient is part of an litigation investigation itno health and medical condition. She would not disclose much additional information other than patient's daughter (and [place]) can be conference into call when Dr. Quay Burow or CMA returns her call. She states that they have some questions for Dr. Quay Burow regarding patient's health.

## 2019-10-05 NOTE — Telephone Encounter (Signed)
LVM for Shaun James to call back in regards.

## 2019-10-10 NOTE — Telephone Encounter (Signed)
LVM for Shaun James to call back

## 2019-10-10 NOTE — Telephone Encounter (Signed)
   Please return call to  [facility]  Please call  [phone]

## 2019-11-15 ENCOUNTER — Other Ambulatory Visit: Payer: Self-pay | Admitting: Internal Medicine

## 2019-11-19 NOTE — Telephone Encounter (Signed)
    Please call Recardo Evangelist. Call [phone]

## 2019-11-20 ENCOUNTER — Encounter: Payer: Self-pay | Admitting: Internal Medicine

## 2019-11-20 NOTE — Telephone Encounter (Signed)
Spoke with daughter in regards to phone call with Fredderick Severance and stated that it would be best to seek neurology help in the evaluation of how father is doing cognitively. Daughter expressed understanding and will reach out to neuro in regards.

## 2019-11-21 ENCOUNTER — Encounter: Payer: Self-pay | Admitting: Internal Medicine

## 2019-11-21 MED ORDER — SERTRALINE HCL 50 MG PO TABS: 50.0000 mg | ORAL_TABLET | Freq: Every day | ORAL | 1 refills | Status: DC

## 2019-12-12 ENCOUNTER — Other Ambulatory Visit: Payer: Self-pay | Admitting: Internal Medicine

## 2019-12-13 ENCOUNTER — Ambulatory Visit (INDEPENDENT_AMBULATORY_CARE_PROVIDER_SITE_OTHER): Payer: Medicare Other | Admitting: *Deleted

## 2019-12-13 DIAGNOSIS — I495 Sick sinus syndrome: Secondary | ICD-10-CM | POA: Diagnosis not present

## 2019-12-13 LAB — CUP PACEART REMOTE DEVICE CHECK
Battery Remaining Longevity: 18 mo
Battery Remaining Percentage: 30 %
Brady Statistic RA Percent Paced: 56 %
Brady Statistic RV Percent Paced: 2 %
Date Time Interrogation Session: 20210401045200
Implantable Lead Implant Date: 20121214
Implantable Lead Implant Date: 20121214
Implantable Lead Location: 753859
Implantable Lead Location: 753860
Implantable Lead Model: 4135
Implantable Lead Model: 4136
Implantable Lead Serial Number: 29066260
Implantable Lead Serial Number: 29098858
Implantable Pulse Generator Implant Date: 20121214
Lead Channel Impedance Value: 512 Ohm
Lead Channel Impedance Value: 636 Ohm
Lead Channel Pacing Threshold Amplitude: 0.5 V
Lead Channel Pacing Threshold Amplitude: 1 V
Lead Channel Pacing Threshold Pulse Width: 0.4 ms
Lead Channel Pacing Threshold Pulse Width: 0.4 ms
Lead Channel Setting Pacing Amplitude: 1 V
Lead Channel Setting Pacing Amplitude: 2 V
Lead Channel Setting Pacing Pulse Width: 0.4 ms
Lead Channel Setting Sensing Sensitivity: 2.5 mV
Pulse Gen Serial Number: 118262

## 2019-12-13 NOTE — Progress Notes (Signed)
PPM Remote  

## 2019-12-19 NOTE — Progress Notes (Signed)
Subjective:    Patient ID: Shaun James, male    DOB: 22-May-1931, 86 y.o.   MRN: [id number]  HPI The patient is here for an acute visit.  He is here with his daughter and caregiver.  His daughter has a few concerns including significantly decreased appetite and weight loss.  She has to force him to eat, but he still he gets full quickly and will not eat much.  He tends to eat a good breakfast, but the rest of the day he eats very little.  They continue to encourage him to eat and snack.  They were wondering if we could do something to increase his appetite.  Dementia, confusion: His memory has gotten worse she has seen the progression.  She states he probably sleeps 20 hours out of the day.  He does do a little exercise with a caregiver.  They try to keep him active or going out during the day, but he just wants to sleep.  He has no short-term memory.   Choking, difficulty swallowing: They have noticed that he chokes more often has difficulty swallowing liquids, food and even pills.  They are always there when he is eating and watching him eat and trying to make sure he swallows properly.  They have cut up his food into smaller pieces, which helps but he often put several pieces in his mouth at once.     Medications and allergies reviewed with patient and updated if appropriate.  Patient Active Problem List   Diagnosis Date Noted  . Fatigue 09/26/2019  . Cough 05/17/2019  . Tear duct infection, left 05/17/2019  . Overactive bladder 05/16/2019  . Depression 12/28/2018  . Rash and nonspecific skin eruption 11/14/2018  . Facial fracture ([place]) 06/26/2018  . Physical deconditioning 06/26/2018  . Poor balance 06/26/2018  . Recurrent falls 04/29/2018  . Dizziness 04/18/2018  . Fall at home 04/18/2018  . Multiple facial fractures ([place]) 04/18/2018  . Memory difficulties 01/09/2018  . Urgency incontinence 01/09/2018  . Ulnar neuropathy of right upper extremity 12/19/2017  . Anxiety  12/19/2017  . Sleep difficulties 09/21/2017  . Acute left ankle pain 09/21/2017  . Subdural hemorrhage ([place]) 05/09/2017  . Subarachnoid hemorrhage ([place]) 05/09/2017  . Prediabetes 04/06/2017  . Cervical radiculopathy at C8 10/08/2016  . Paroxysmal atrial fibrillation (HCC)   . Expressive aphasia 08/18/2016  . Numbness and tingling of right arm 08/17/2016  . AAA (abdominal aortic aneurysm) without rupture ([place]) 04/25/2016  . Pulmonary nodule 04/25/2016  . Bilateral hearing loss 03/22/2016  . Aneurysm, cerebral, nonruptured 03/11/2016  . Carotid artery stenosis 03/11/2016  . BPPV (benign paroxysmal positional vertigo) 03/10/2016  . Ataxia   . Burning tongue 08/12/2015  . GERD (gastroesophageal reflux disease) 08/12/2015  . BPH without urinary obstruction 04/10/2015  . 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 Chronic headache   . Osteoarthritis   . Cardiac pacemaker in situ 10/18/2011  . CAD (coronary artery disease), autologous vein bypass graft 09/13/2011  . Sick sinus syndrome ([place]) 09/13/2011  . Hyperlipidemia with target LDL less than 70 04/16/2008  . Acute myocardial infarction ([place]) 04/16/2008  . Pulmonary fibrosis ([place]) 04/16/2008    Current Outpatient Medications on File Prior to Visit  Medication Sig Dispense Refill  . acetaminophen (TYLENOL) 500 MG tablet Take 1 tablet (500 mg total) by mouth daily as needed for headache. 14 tablet 0  . clopidogrel (PLAVIX) 75 MG tablet Take by mouth.    . diazepam (VALIUM) 2  MG tablet Take 1 tablet (2 mg total) by mouth daily as needed for anxiety. 30 tablet 0  . diltiazem (CARTIA XT) 120 MG 24 hr capsule Take 1 capsule (120 mg total) by mouth daily. 90 capsule 3  . donepezil (ARICEPT) 10 MG tablet TAKE 1 TABLET BY MOUTH EVERYDAY AT BEDTIME 90 tablet 0  . MYRBETRIQ 25 MG TB24 tablet TAKE 1 TABLET BY MOUTH EVERY DAY 90 tablet 0  . nitroGLYCERIN (NITROSTAT) 0.4 MG SL tablet Place 1 tablet (0.4 mg total) under the tongue every 5  (five) minutes as needed for chest pain. 25 tablet 3  . omeprazole (PRILOSEC) 20 MG capsule Take by mouth.    . simvastatin (ZOCOR) 20 MG tablet TAKE 1 TABLET BY MOUTH EVERY DAY IN THE EVENING. Need office visit for more refills. 90 tablet 0   No current facility-administered medications on file prior to visit.    Past Medical History:  Diagnosis Date  . AAA (abdominal aortic aneurysm) (HCC)    3.1 cm AAA, reevaluated 08/2009 per ultrasound - stable  . Allergic rhinitis   . Aortic sclerosis    Probable AS on physical exam, 2010  . Aortic stenosis    mild AS 2014 echo  . Arthritis   . Atrial fibrillation (HCC)    chronic anticoag  . Atrial flutter ([place])   . Basal cell carcinoma   . Benign positional vertigo   . BPH (benign prostatic hyperplasia)   . CAD in native artery    a) s/p CABG '98; LIMA-LAD, SVG-D1, SVG-OM1, SVG-PDA); b) CATH -2009: midLAD & RCA 100%, OM2 100% wtih severe native Cx; Grafts patent with ~30-50% SVG-D1 & ~30-40% SVG-OM, EF 45%; c) Lexiscan Cardiolite 02/2014: EF 61%, No Ischemia; subtle fixed anteroseptal defect.  . Cardiac pacemaker in [place]  . Coronary atherosclerosis of native coronary artery   . Diverticulosis of colon (without mention of hemorrhage)   . DJD (degenerative joint disease)   . Esophageal reflux   . Essential hypertension, benign   . GERD (gastroesophageal reflux disease)   . Headache    after brain aneurysm  . History of tuberculosis    remote Hx  . Inflamed seborrheic keratosis   . Intermittent confusion   . Internal hemorrhoids    severe  . Mixed hyperlipidemia   . Mouth burn   . Myocardial infarction ([place])   . Near syncope    uncertain cause. R/O arrhythmia, R/O med effect  . Osteoarthritis 06/14/2012  . Peptic ulcer, unspecified site, unspecified as acute or chronic, without mention of hemorrhage, perforation, or obstruction   . Personal history of colonic polyps   . Pneumonia   . Presbycusis of both ears      Bilateral hearing aids  . Presence of permanent cardiac pacemaker   . Prostatitis dx 12/2102   E coli Ucx  . SSS (sick sinus syndrome) (HCC)    syncope, s/p ppm 12/12  . Systolic murmur    Worrisome for AS. AS could cause exertional fatigue.    Past Surgical History:  Procedure Laterality Date  . CARDIAC CATHETERIZATION  2002  . cataracts     bilateral  . COLONOSCOPY    . CORONARY ARTERY BYPASS GRAFT  1997   (LIMA to LAD, SVG to diagonal-50% closed on catheterization in 1999, SVG to OM1, SVG to PDA) Repeat cath 2009 with patent grafts  . EXCISION OF TONGUE LESION    . EYE SURGERY Bilateral    cataract  surgery with lens implant  . HEMORRHOID SURGERY  07/31/2010  . INSERT / REPLACE / REMOVE PACEMAKER  08/27/2011   PPM implant  . IR GENERIC HISTORICAL  04/06/2016   IR ANGIO VERTEBRAL SEL SUBCLAVIAN INNOMINATE UNI L MOD SED 04/06/2016 Luanne Bras, MD [facility]  . IR GENERIC HISTORICAL  04/06/2016   IR ANGIO VERTEBRAL SEL VERTEBRAL UNI R MOD SED 04/06/2016 Luanne Bras, MD [facility]  . IR GENERIC HISTORICAL  04/06/2016   IR ANGIO INTRA EXTRACRAN SEL COM CAROTID INNOMINATE BILAT MOD SED 04/06/2016 Luanne Bras, MD [facility]  . IR GENERIC HISTORICAL  05/19/2016   IR ANGIO INTRA EXTRACRAN SEL INTERNAL CAROTID UNI L MOD SED 05/19/2016 Luanne Bras, MD [facility]  . IR GENERIC HISTORICAL  05/19/2016   IR NEURO EACH ADD'L AFTER BASIC UNI LEFT (MS) 05/19/2016 Luanne Bras, MD [facility]  . IR GENERIC HISTORICAL  05/19/2016   IR ANGIOGRAM FOLLOW UP STUDY 05/19/2016 Luanne Bras, MD [facility]  . IR GENERIC HISTORICAL  05/19/2016   IR ANGIO VERTEBRAL SEL VERTEBRAL UNI L MOD SED 05/19/2016 Luanne Bras, MD [facility]  . IR GENERIC HISTORICAL  05/19/2016   IR TRANSCATH/EMBOLIZ 05/19/2016 Luanne Bras, MD [facility]  . IR GENERIC HISTORICAL  05/19/2016   IR 3D INDEPENDENT WKST 05/19/2016 Luanne Bras, MD [facility]  . IR GENERIC HISTORICAL   06/03/2016   IR RADIOLOGIST EVAL & MGMT 06/03/2016 [facility]  . IR GENERIC HISTORICAL  07/20/2016   IR RADIOLOGIST EVAL & MGMT 07/20/2016 [facility]  . left rotator cuff surgery  2000  . PERMANENT PACEMAKER INSERTION N/A 08/27/2011   Procedure: PERMANENT PACEMAKER INSERTION;  Surgeon: Evans Lance, MD;  Location[facility] C[facility] LAB;  Service: Cardiovascular;  Laterality: N/A;  . PUNCH BIOPSY OF SKIN  08/2009   5 mm punch biopsy on upper mid back melanotic appearing lesion  . RADIOLOGY WITH ANESTHESIA N/A 05/19/2016   Procedure: Embolization;  Surgeon: Luanne Bras, MD;  Location: [place];  Service: Radiology;  Laterality: N/A;    Social History   Socioeconomic History  . Marital status: Married    Spouse name: Not on file  . Number of children: 4  . Years of education: Not on file  . Highest education level: Not on file  Occupational History  . Occupation: retired    Comment: retired Art gallery manager  Tobacco Use  . Smoking status: Former Smoker    Packs/day: 0.50    Years: 15.00    Pack years: 7.50    Types: Cigarettes    Quit date: 09/13/1965    Years since quitting: 54.3  . Smokeless tobacco: Never Used  Substance and Sexual Activity  . Alcohol use: No    Alcohol/week: 0.0 standard drinks  . Drug use: No  . Sexual activity: Not Currently  Other Topics Concern  . Not on file  Social History Narrative   Moved from [place] in [place] alone; lives in 2 story home but stays downstairs   3 children / 1 other;    Social Determinants of Health   Financial Resource Strain:   . Difficulty of Paying Living Expenses:   Food Insecurity:   . Worried About Charity fundraiser in the Last Year:   . Arboriculturist in the Last Year:   Transportation Needs:   . Film/video editor (Medical):   Marland Kitchen Lack of Transportation (Non-Medical):   Physical Activity:   . Days of Exercise per  Week:   . Minutes of Exercise per Session:   Stress:   . Feeling of Stress :   Social  Connections:   . Frequency of Communication with Friends and Family:   . Frequency of Social Gatherings with Friends and Family:   . Attends Religious Services:   . Active Member of Clubs or Organizations:   . Attends Archivist Meetings:   Marland Kitchen Marital Status:     Family History  Problem Relation Age of Onset  . Other Father 58       [place]  . Heart attack Mother 54  . Heart disease Mother        ASHD  . COPD Brother   . Hypertension Brother   . Heart disease Brother        ASHD  . Heart disease Brother        ASHD  . Heart disease Sister        ASHD  . COPD Brother   . Coronary artery disease Unknown     Review of Systems  Unable to perform ROS: Dementia       Objective:   Vitals:   12/20/19 1105  BP: 140/72  Pulse: 70  Resp: 14  Temp: (!) 97.5 F (36.4 C)  SpO2: 98%   BP Readings from Last 3 Encounters:  12/20/19 140/72  05/17/19 126/74  11/14/18 124/72   Wt Readings from Last 3 Encounters:  12/20/19 101 lb (45.8 kg)  05/17/19 117 lb 12.8 oz (53.4 kg)  11/14/18 122 lb 3.2 oz (55.4 kg)   Body mass index is 17.89 kg/m.   Physical Exam Constitutional:      General: He is not in acute distress.    Appearance: He is not ill-appearing.     Comments: Thin, slightly cachectic elderly man  HENT:     Head: Normocephalic and atraumatic.  Cardiovascular:     Rate and Rhythm: Normal rate and regular rhythm.  Pulmonary:     Effort: Pulmonary effort is normal. No respiratory distress.     Breath sounds: Normal breath sounds. No wheezing or rales.  Abdominal:     General: There is no distension.     Palpations: Abdomen is soft.     Tenderness: There is no abdominal tenderness.  Musculoskeletal:     Right lower leg: No edema.     Left lower leg: No edema.  Skin:    General: Skin is warm and dry.            Assessment & Plan:    See Problem List for Assessment and Plan of chronic medical problems.    This visit occurred during  the SARS-CoV-2 public health emergency.  Safety protocols were in place, including screening questions prior to the visit, additional usage of staff PPE, and extensive cleaning of exam room while observing appropriate contact time as indicated for disinfecting solutions.

## 2019-12-20 ENCOUNTER — Ambulatory Visit: Payer: Medicare Other | Admitting: Internal Medicine

## 2019-12-20 ENCOUNTER — Other Ambulatory Visit: Payer: Self-pay

## 2019-12-20 ENCOUNTER — Encounter: Payer: Self-pay | Admitting: Internal Medicine

## 2019-12-20 VITALS — BP 140/72 | HR 70 | Temp 97.5°F | Resp 14 | Ht 63.0 in | Wt 101.0 lb

## 2019-12-20 DIAGNOSIS — T17308A Unspecified foreign body in larynx causing other injury, initial encounter: Secondary | ICD-10-CM | POA: Diagnosis not present

## 2019-12-20 DIAGNOSIS — R634 Abnormal weight loss: Secondary | ICD-10-CM | POA: Diagnosis not present

## 2019-12-20 DIAGNOSIS — F0390 Unspecified dementia without behavioral disturbance: Secondary | ICD-10-CM

## 2019-12-20 DIAGNOSIS — E46 Unspecified protein-calorie malnutrition: Secondary | ICD-10-CM | POA: Insufficient documentation

## 2019-12-20 DIAGNOSIS — E43 Unspecified severe protein-calorie malnutrition: Secondary | ICD-10-CM

## 2019-12-20 MED ORDER — SERTRALINE HCL 50 MG PO TABS: 50.0000 mg | ORAL_TABLET | Freq: Every day | ORAL | 1 refills | Status: AC

## 2019-12-20 MED ORDER — MEGESTROL ACETATE 40 MG/ML PO SUSP: 800.0000 mg | Freq: Every day | ORAL | 5 refills | Status: AC

## 2019-12-20 NOTE — Assessment & Plan Note (Signed)
Acute He has lost weight since his last visit, which is related to decreased appetite probably secondary to dementia Typically eats a good breakfast, but not much else during the day Family encourages him to eat and provides good nutrition He will not drink any boost or Ensure They have done everything they can to increase his intake, but he states he is just not hungry and gets full quickly Depression overall controlled We will try Megace

## 2019-12-20 NOTE — Assessment & Plan Note (Signed)
Increasing He chokes, gags and retains food or liquids in his mouth Family are healthy it is always present when eating and they do try to coach him to eat properly Would like further evaluation and revision of diet if necessary Modified barium swallow ordered with speech path

## 2019-12-20 NOTE — Assessment & Plan Note (Signed)
Secondary to dementia, decreased appetite, weight loss We will try Megace to see if this increases his appetite Family encourages good nutrition and him eating more

## 2019-12-20 NOTE — Assessment & Plan Note (Signed)
Chronic His dementia has progressed His memory has gotten worse, he is sleeping most of the day and appetite has decreased Currently on Aricept Living with daughter who is very supportive Has health aides His daughter understands that his dementia is progressing and will continue to progress

## 2019-12-20 NOTE — Patient Instructions (Addendum)
  Medications reviewed and updated.  Changes include :   Starting megace to increase his appetite.    Your prescription(s) have been submitted to your pharmacy. Please take as directed and contact our office if you believe you are having problem(s) with the medication(s).  A referral was ordered for a swallow evaluation.     Someone will call you to schedule this.

## 2019-12-24 ENCOUNTER — Other Ambulatory Visit (HOSPITAL_COMMUNITY): Payer: Self-pay

## 2019-12-24 DIAGNOSIS — R1310 Dysphagia, unspecified: Secondary | ICD-10-CM

## 2019-12-28 ENCOUNTER — Other Ambulatory Visit: Payer: Self-pay | Admitting: Internal Medicine

## 2020-01-02 ENCOUNTER — Ambulatory Visit (HOSPITAL_COMMUNITY)
Admission: RE | Admit: 2020-01-02 | Discharge: 2020-01-02 | Disposition: A | Discharge status: Home / Self Care | Payer: Medicare Other | Source: Ambulatory Visit | Attending: Internal Medicine | Admitting: Internal Medicine

## 2020-01-02 ENCOUNTER — Other Ambulatory Visit: Payer: Self-pay

## 2020-01-02 DIAGNOSIS — R1310 Dysphagia, unspecified: Secondary | ICD-10-CM | POA: Diagnosis present

## 2020-01-02 DIAGNOSIS — T17308A Unspecified foreign body in larynx causing other injury, initial encounter: Secondary | ICD-10-CM | POA: Diagnosis present

## 2020-02-09 ENCOUNTER — Other Ambulatory Visit: Payer: Self-pay | Admitting: Internal Medicine

## 2020-02-12 ENCOUNTER — Encounter: Payer: Self-pay | Admitting: Internal Medicine

## 2020-02-12 DIAGNOSIS — F0390 Unspecified dementia without behavioral disturbance: Secondary | ICD-10-CM

## 2020-02-13 NOTE — Telephone Encounter (Signed)
Hospice called and verbal referral placed.

## 2020-02-22 ENCOUNTER — Other Ambulatory Visit: Payer: Self-pay | Admitting: Internal Medicine

## 2020-02-22 ENCOUNTER — Telehealth: Payer: Self-pay

## 2020-02-22 MED ORDER — CLOBETASOL PROPIONATE 0.05 % EX CREA: 1.0000 "application " | TOPICAL_CREAM | Freq: Two times a day (BID) | CUTANEOUS | 0 refills | Status: DC

## 2020-02-22 NOTE — Telephone Encounter (Signed)
Stronger steroid cream sent to [facility]

## 2020-02-22 NOTE — Telephone Encounter (Signed)
New message    Shaun James with [facility] calling   C/o rash upper part of back/shoulder area.   Shaun James is aware the call will be transfer over to the [facility] Triage nurse for assessment   Call transfer / spoke Dorothea Ogle

## 2020-02-22 NOTE — Telephone Encounter (Signed)
Rash in on back and shoulder. [facility] has tried to treat for over a week. Rash is pink and flat. They have tried and failed hydrocortisone and zinc cream.   Pt is non verbal and not able to come in for an appointment. (Hospice patient).   Will you call in a prescription for rash?

## 2020-02-22 NOTE — Telephone Encounter (Signed)
Left vm with Vivien Rota informing that clobetasol cream (TEMOVATE) 0.05 % has been sent in for patient.   Spoke to pt dtr [name]) and informed rx has been sent to [facility] in [place].

## 2020-03-04 ENCOUNTER — Other Ambulatory Visit: Payer: Self-pay | Admitting: Internal Medicine

## 2020-03-13 ENCOUNTER — Ambulatory Visit (INDEPENDENT_AMBULATORY_CARE_PROVIDER_SITE_OTHER): Admitting: *Deleted

## 2020-03-13 DIAGNOSIS — I495 Sick sinus syndrome: Secondary | ICD-10-CM

## 2020-03-13 LAB — CUP PACEART REMOTE DEVICE CHECK
Battery Remaining Longevity: 18 mo
Battery Remaining Percentage: 26 %
Brady Statistic RA Percent Paced: 51 %
Brady Statistic RV Percent Paced: 1 %
Date Time Interrogation Session: 20210701045100
Implantable Lead Implant Date: 20121214
Implantable Lead Implant Date: 20121214
Implantable Lead Location: 753859
Implantable Lead Location: 753860
Implantable Lead Model: 4135
Implantable Lead Model: 4136
Implantable Lead Serial Number: 29066260
Implantable Lead Serial Number: 29098858
Implantable Pulse Generator Implant Date: 20121214
Lead Channel Impedance Value: 538 Ohm
Lead Channel Impedance Value: 653 Ohm
Lead Channel Pacing Threshold Amplitude: 0.5 V
Lead Channel Pacing Threshold Amplitude: 1 V
Lead Channel Pacing Threshold Pulse Width: 0.4 ms
Lead Channel Pacing Threshold Pulse Width: 0.4 ms
Lead Channel Setting Pacing Amplitude: 1 V
Lead Channel Setting Pacing Amplitude: 2 V
Lead Channel Setting Pacing Pulse Width: 0.4 ms
Lead Channel Setting Sensing Sensitivity: 2.5 mV
Pulse Gen Serial Number: 118262

## 2020-03-14 NOTE — Progress Notes (Signed)
Remote pacemaker transmission.   

## 2020-04-01 ENCOUNTER — Telehealth: Payer: Self-pay | Admitting: Internal Medicine

## 2020-04-01 ENCOUNTER — Other Ambulatory Visit: Payer: Self-pay | Admitting: Internal Medicine

## 2020-04-01 DIAGNOSIS — N3001 Acute cystitis with hematuria: Secondary | ICD-10-CM

## 2020-04-01 MED ORDER — SULFAMETHOXAZOLE-TRIMETHOPRIM 800-160 MG PO TABS: 1.0000 | ORAL_TABLET | Freq: Two times a day (BID) | ORAL | 0 refills | Status: AC

## 2020-04-01 NOTE — Telephone Encounter (Signed)
Shaun James was also needing a prescription for a morphine concentrate.

## 2020-04-01 NOTE — Telephone Encounter (Signed)
Vivien Rota with [facility] called and was wondering if the pt could get a liquid antibiotic or an antibiotic that can be crushed for a potential UTI. The pt is more agitated than normal to get a urine sample, there is also blood in the urine and it is dark with a foul smell. Vivien Rota would like a call back.

## 2020-04-02 ENCOUNTER — Other Ambulatory Visit: Payer: Self-pay | Admitting: Internal Medicine

## 2020-04-02 DIAGNOSIS — Z515 Encounter for palliative care: Secondary | ICD-10-CM | POA: Insufficient documentation

## 2020-04-02 MED ORDER — MORPHINE SULFATE (CONCENTRATE) 10 MG /0.5 ML PO SOLN: 10.0000 mg | Freq: Four times a day (QID) | ORAL | 0 refills | Status: AC | PRN

## 2020-04-02 NOTE — Telephone Encounter (Signed)
The morphine is for pain and shortness of breath.

## 2020-04-03 ENCOUNTER — Telehealth: Payer: Self-pay | Admitting: Emergency Medicine

## 2020-04-04 NOTE — Telephone Encounter (Signed)
[employer] dropped off and death certificate to be completed and signed by MD. Placed in MD's box for completion.

## 2020-04-07 NOTE — Telephone Encounter (Signed)
Spoke with Shaun James today. He has been made aware death certificate is ready for pick up.

## 2020-04-09 ENCOUNTER — Telehealth: Payer: Self-pay | Admitting: Internal Medicine

## 2020-04-09 NOTE — Telephone Encounter (Signed)
  1. Has your device fired? No   2. Is you device beeping? No   3. Are you experiencing draining or swelling at device site? No  4. Are you calling to see if we received your device transmission? No   5. Have you passed out? No   Shaun James the patient's daughter is calling in regards to Shaun James's monitor wanting to know if it needs to be returned since his passing.   Please route to [place]

## 2020-04-10 NOTE — Telephone Encounter (Signed)
I let the pt daughter know that she can discard the monitor.

## 2020-04-13 NOTE — Telephone Encounter (Signed)
Vivien Rota with [facility] called and stated patient passed away today 04/17/2020 at 2:26pm. Thanks

## 2020-04-13 DEATH — deceased
